# Patient Record
Sex: Male | Born: 1960
Health system: Southern US, Community
[De-identification: ages and names within clinical notes are randomized; demographics above are authoritative.]

## PROBLEM LIST (undated history)

## (undated) ENCOUNTER — Emergency Department (HOSPITAL_COMMUNITY): Discharge status: Absent without leave | Payer: Medicare HMO

## (undated) DIAGNOSIS — E785 Hyperlipidemia, unspecified: Secondary | ICD-10-CM

## (undated) DIAGNOSIS — E039 Hypothyroidism, unspecified: Secondary | ICD-10-CM

## (undated) DIAGNOSIS — F32A Depression, unspecified: Secondary | ICD-10-CM

## (undated) DIAGNOSIS — E119 Type 2 diabetes mellitus without complications: Secondary | ICD-10-CM

## (undated) DIAGNOSIS — Z1211 Encounter for screening for malignant neoplasm of colon: Secondary | ICD-10-CM

## (undated) DIAGNOSIS — G8929 Other chronic pain: Secondary | ICD-10-CM

## (undated) DIAGNOSIS — R7989 Other specified abnormal findings of blood chemistry: Secondary | ICD-10-CM

## (undated) DIAGNOSIS — F329 Major depressive disorder, single episode, unspecified: Secondary | ICD-10-CM

## (undated) DIAGNOSIS — F419 Anxiety disorder, unspecified: Secondary | ICD-10-CM

## (undated) DIAGNOSIS — I10 Essential (primary) hypertension: Secondary | ICD-10-CM

## (undated) DIAGNOSIS — G4730 Sleep apnea, unspecified: Secondary | ICD-10-CM

## (undated) HISTORY — DX: Anxiety disorder, unspecified: F41.9

## (undated) HISTORY — DX: Type 2 diabetes mellitus without complications: E11.9

## (undated) HISTORY — DX: Other chronic pain: G89.29

## (undated) HISTORY — PX: ARTHROSCOPIC REPAIR ACL: SUR80

## (undated) HISTORY — DX: Hyperlipidemia, unspecified: E78.5

## (undated) HISTORY — PX: OTHER SURGICAL HISTORY: SHX169

## (undated) HISTORY — PX: HERNIA REPAIR: SHX51

## (undated) HISTORY — DX: Encounter for screening for malignant neoplasm of colon: Z12.11

## (undated) HISTORY — DX: Other specified abnormal findings of blood chemistry: R79.89

## (undated) HISTORY — PX: CARPAL TUNNEL RELEASE: SHX101

## (undated) HISTORY — PX: SHOULDER ARTHROSCOPY: SHX128

## (undated) HISTORY — DX: Essential (primary) hypertension: I10

## (undated) HISTORY — DX: Major depressive disorder, single episode, unspecified: F32.9

## (undated) HISTORY — DX: Depression, unspecified: F32.A

---

## 2016-07-09 ENCOUNTER — Ambulatory Visit (INDEPENDENT_AMBULATORY_CARE_PROVIDER_SITE_OTHER): Payer: Medicare HMO | Admitting: Cardiovascular Disease

## 2016-07-09 ENCOUNTER — Encounter: Payer: Self-pay | Admitting: Cardiovascular Disease

## 2016-07-09 VITALS — BP 130/80 | HR 81 | Ht 66.0 in | Wt 265.2 lb

## 2016-07-09 DIAGNOSIS — Z9289 Personal history of other medical treatment: Secondary | ICD-10-CM | POA: Diagnosis not present

## 2016-07-09 DIAGNOSIS — I4510 Unspecified right bundle-branch block: Secondary | ICD-10-CM

## 2016-07-09 DIAGNOSIS — E118 Type 2 diabetes mellitus with unspecified complications: Secondary | ICD-10-CM | POA: Diagnosis not present

## 2016-07-09 DIAGNOSIS — I2510 Atherosclerotic heart disease of native coronary artery without angina pectoris: Secondary | ICD-10-CM | POA: Diagnosis not present

## 2016-07-09 DIAGNOSIS — E782 Mixed hyperlipidemia: Secondary | ICD-10-CM | POA: Diagnosis not present

## 2016-07-09 NOTE — Patient Instructions (Signed)
Medication Instructions:  Continue all current medications.  Labwork: none  Testing/Procedures: none  Follow-Up: As needed.    Any Other Special Instructions Will Be Listed Below (If Applicable).  If you need a refill on your cardiac medications before your next appointment, please call your pharmacy.  
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
## 2016-10-19 ENCOUNTER — Telehealth (HOSPITAL_COMMUNITY): Payer: Self-pay | Admitting: *Deleted

## 2016-10-19 NOTE — Telephone Encounter (Signed)
left voice message regarding an appointment. 

## 2017-02-05 ENCOUNTER — Other Ambulatory Visit: Payer: Self-pay

## 2017-02-05 ENCOUNTER — Ambulatory Visit (INDEPENDENT_AMBULATORY_CARE_PROVIDER_SITE_OTHER): Payer: Medicare HMO | Admitting: Family Medicine

## 2017-02-05 ENCOUNTER — Encounter: Payer: Self-pay | Admitting: Family Medicine

## 2017-02-05 VITALS — BP 120/70 | HR 64 | Temp 98.0°F | Resp 16 | Ht 66.0 in | Wt 264.2 lb

## 2017-02-05 DIAGNOSIS — I10 Essential (primary) hypertension: Secondary | ICD-10-CM | POA: Diagnosis not present

## 2017-02-05 DIAGNOSIS — Z794 Long term (current) use of insulin: Secondary | ICD-10-CM

## 2017-02-05 DIAGNOSIS — F419 Anxiety disorder, unspecified: Secondary | ICD-10-CM | POA: Diagnosis not present

## 2017-02-05 DIAGNOSIS — E1165 Type 2 diabetes mellitus with hyperglycemia: Secondary | ICD-10-CM | POA: Insufficient documentation

## 2017-02-05 DIAGNOSIS — E1169 Type 2 diabetes mellitus with other specified complication: Secondary | ICD-10-CM

## 2017-02-05 DIAGNOSIS — E039 Hypothyroidism, unspecified: Secondary | ICD-10-CM

## 2017-02-05 DIAGNOSIS — J40 Bronchitis, not specified as acute or chronic: Secondary | ICD-10-CM | POA: Diagnosis not present

## 2017-02-05 DIAGNOSIS — E118 Type 2 diabetes mellitus with unspecified complications: Secondary | ICD-10-CM | POA: Insufficient documentation

## 2017-02-05 DIAGNOSIS — F339 Major depressive disorder, recurrent, unspecified: Secondary | ICD-10-CM

## 2017-02-05 DIAGNOSIS — F332 Major depressive disorder, recurrent severe without psychotic features: Secondary | ICD-10-CM | POA: Insufficient documentation

## 2017-02-05 DIAGNOSIS — E119 Type 2 diabetes mellitus without complications: Secondary | ICD-10-CM | POA: Insufficient documentation

## 2017-02-05 DIAGNOSIS — Z79899 Other long term (current) drug therapy: Secondary | ICD-10-CM | POA: Diagnosis not present

## 2017-02-05 DIAGNOSIS — E785 Hyperlipidemia, unspecified: Secondary | ICD-10-CM | POA: Diagnosis not present

## 2017-02-05 DIAGNOSIS — F331 Major depressive disorder, recurrent, moderate: Secondary | ICD-10-CM | POA: Insufficient documentation

## 2017-02-05 MED ORDER — AZITHROMYCIN 250 MG PO TABS: ORAL_TABLET | ORAL | 0 refills | Status: DC

## 2017-02-05 MED ORDER — ALPRAZOLAM 0.5 MG PO TABS: 0.5000 mg | ORAL_TABLET | Freq: Two times a day (BID) | ORAL | 0 refills | Status: DC | PRN

## 2017-02-05 NOTE — Progress Notes (Signed)
Patient ID: Olyn Landstrom, male    DOB: [birth year], 58 y.o.   MRN: [id number]  Chief Complaint  Patient presents with  . Hyperlipidemia  . Hypothyroidism  . Diabetes    Allergies Codeine and Penicillins  Subjective:   Levaughn Puccinelli is a 56 y.o. male who presents to [facility] today.  HPI Here to establish care.  Patient reports that he has had a very stressful past couple months.  He reports that his daughter age 28 years old died a couple months ago from a very rare type of cancer.  In addition, he reports that his brother died April 23, 2016. Reports that dealing with the death of his daughter is the hardest thing he has ever dealt with.  He reports that he has dealt with depression in the past but it has been worse since his daughter passed away.  He reports that he he promised his daughter that he would not hurt himself. She was afraid that he would hurt himself if she died. Reports that he is divorced. Lives in [place], [place] for the past 14 months, moved from [place]. His daughter had lived here and so he had moved here.  Reports he has been treated for depression and anxiety.  Has been on the Cymbalta and Xanax.  Reports that it works well.  Denies any auditory or visual hallucinations.  Denies any suicidal ideations or homicidal ideations.  Reports that he does feel down and is dealing with his grief.  Reports that he is tearful at times when he thinks about his daughter.  Has been on Xanax for anxiety twice a day for greater than 5 years.  He reports that he has had chest congestion since 01/18/17. Was told to take mucinex and OTC cough syrup.  Reports that since that time he is continued to cough.  He reports he is coughing up green to yellow sputum.  Does not feel short of breath but reports he can hear wheezing in his chest.  Denies any fevers, nausea, vomiting.  Reports his appetite is good.  No history of tobacco use.  Has never had a history of asthma or been on any  inhalers in the past.   Reports that he has been on morphine time release, since 2006. Is on a total of morphine 180 mg a day from pain clinic in [place].  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  He reports that he already has an appointment scheduled with a pain clinic in [place].  He does not need any of this medication today.  Patient reports that he has been diabetic for over 4 years.  Denies any hypoglycemic episodes.  Does check his sugars and reports they usually run in the 100-200 range.  He did not bring any blood sugars for review today.  He reports he tries to eat a healthy diet.  Does not do any formal exercise.  Denies any lesions on his feet.  He reports he has never been told he has any diabetic retinopathy.  Has not had his eyes checked in several years.  Diabetes  He presents for his follow-up diabetic visit. He has type 2 diabetes mellitus. His disease course has been fluctuating. Pertinent negatives for hypoglycemia include no dizziness, headaches, seizures, sleepiness or tremors. There are no diabetic associated symptoms. Pertinent negatives for diabetes include no blurred vision, no  chest pain, no fatigue, no foot paresthesias, no foot ulcerations, no polydipsia, no polyphagia, no polyuria, no visual change, no weakness and no weight loss. There are no hypoglycemic complications. Symptoms are stable. There are no diabetic complications. Risk factors for coronary artery disease include diabetes mellitus, dyslipidemia, male sex, obesity and stress. Current diabetic treatment includes oral agent (triple therapy) and insulin injections. He is compliant with treatment all of the time. His weight is stable. He is following a diabetic and generally healthy diet. When asked about meal planning, he reported none. He has not had a previous visit with a dietitian. He participates in  exercise intermittently. His overall blood glucose range is 180-200 mg/dl. An ACE inhibitor/angiotensin II receptor blocker is being taken. He does not see a podiatrist.Eye exam is not current.  Cough  This is a new problem. The current episode started 1 to 4 weeks ago. The problem has been gradually worsening. The problem occurs every few minutes. The cough is productive of purulent sputum. Pertinent negatives include no chest pain, chills, ear congestion, ear pain, fever, headaches, heartburn, hemoptysis, myalgias, nasal congestion, postnasal drip, rhinorrhea, sore throat, shortness of breath or weight loss. Nothing aggravates the symptoms. He has tried OTC cough suppressant and rest for the symptoms. The treatment provided no relief. His past medical history is significant for bronchitis. There is no history of asthma, bronchiectasis, COPD, emphysema, environmental allergies or pneumonia.  Hypertension  This is a chronic problem. The current episode started more than 1 year ago. The problem has been gradually improving since onset. The problem is controlled. Pertinent negatives include no blurred vision, chest pain, headaches, malaise/fatigue, neck pain, orthopnea, palpitations or shortness of breath. Past treatments include lifestyle changes, diuretics and ACE inhibitors. The current treatment provides significant improvement. Compliance problems include exercise.     Past Medical History:  Diagnosis Date  . Chronic pain   . Depression   . Diabetes mellitus without complication (HCC)   . Hyperlipidemia   . Hypertension     Past Surgical History:  Procedure Laterality Date  . ankle sx    . ARTHROSCOPIC REPAIR ACL    . CARPAL TUNNEL RELEASE    . HERNIA REPAIR    . SHOULDER ARTHROSCOPY      Family History  Problem Relation Age of Onset  . Stomach cancer Mother   . Heart Problems Father   . Dementia Father   . Cancer Father   . Bone cancer Brother   . Lung cancer Brother   . Cancer  Daughter      Social History   Socioeconomic History  . Marital status: Unknown    Spouse name: None  . Number of children: None  . Years of education: None  . Highest education level: None  Social Needs  . Financial resource strain: None  . Food insecurity - worry: None  . Food insecurity - inability: None  . Transportation needs - medical: None  . Transportation needs - non-medical: None  Occupational History  . None  Tobacco Use  . Smoking status: Never Smoker  . Smokeless tobacco: Never Used  Substance and Sexual Activity  . Alcohol use: Yes  . Drug use: No  . Sexual activity: No  Other Topics Concern  . None  Social History Narrative   Retired from 2004 in [employer].   Takes time release morphine b/c fell 20 feet from a building while working.    Exercises/walks.   Eats all food groups.   Has  pets at home.   Does not smoke.   No tobacco products or drugs.    Current Outpatient Medications on File Prior to Visit  Medication Sig Dispense Refill  . DULoxetine (CYMBALTA) 60 MG capsule Take 60 mg by mouth daily.  1  . fluticasone (FLONASE) 50 MCG/ACT nasal spray USE ONE SPRAY in each nostril ONCE EVERY DAY  0  . glipiZIDE (GLUCOTROL XL) 10 MG 24 hr tablet Take 10 mg by mouth daily with breakfast.    . levothyroxine (SYNTHROID, LEVOTHROID) 150 MCG tablet Take 150 mcg by mouth daily before breakfast.    . metFORMIN (GLUCOPHAGE) 1000 MG tablet Take 1,000 mg by mouth 2 (two) times daily.  3  . morphine (MS CONTIN) 30 MG 12 hr tablet Take 1 tablet by mouth 2 (two) times daily. Take with the 60mg      . morphine (MS CONTIN) 60 MG 12 hr tablet Take 1 tablet by mouth 2 (two) times daily. Takes with the 30mg      . simvastatin (ZOCOR) 40 MG tablet TAKE 1 TABLET BY MOUTH IN THE EVENING ONCE A DAILY EMERGENCY REFILL FAXED DR  3  . tamsulosin (FLOMAX) 0.4 MG CAPS capsule AS DIRECTED 30 MINUTES AFTER THE SAME MEAL EACH DAY.  3   No current facility-administered medications on  file prior to visit.      Review of Systems  Constitutional: Negative for chills, fatigue, fever, malaise/fatigue and weight loss.  HENT: Negative for ear pain, postnasal drip, rhinorrhea and sore throat.   Eyes: Negative for blurred vision.  Respiratory: Positive for cough. Negative for hemoptysis and shortness of breath.   Cardiovascular: Negative for chest pain, palpitations and orthopnea.  Gastrointestinal: Negative for heartburn.  Endocrine: Negative for polydipsia, polyphagia and polyuria.  Musculoskeletal: Negative for myalgias and neck pain.  Allergic/Immunologic: Negative for environmental allergies.  Neurological: Negative for dizziness, tremors, seizures, weakness and headaches.     Objective:   BP 120/70 (BP Location: Left Arm, Patient Position: Sitting, Cuff Size: Normal)   Pulse 64   Temp 98 F (36.7 C) (Temporal)   Resp 16   Ht 5\' 6"  (1.676 m)   Wt 264 lb 4 oz (119.9 kg)   SpO2 98%   BMI 42.65 kg/m   Physical Exam  Constitutional: He is oriented to person, place, and time. He appears well-developed and well-nourished.  HENT:  Head: Normocephalic and atraumatic.  Mouth/Throat: Oropharynx is clear and moist. No oropharyngeal exudate.  Eyes: EOM are normal. Pupils are equal, round, and reactive to light. Right eye exhibits no discharge.  Neck: Normal range of motion. Neck supple. No JVD present. No tracheal deviation present.  Cardiovascular: Normal rate, regular rhythm and normal heart sounds.  Pulmonary/Chest: Effort normal. No stridor. No respiratory distress. He has no wheezes. He has no rales. He exhibits no tenderness.  Coarse upper airway noise. No wheezes.   Abdominal: Soft. Bowel sounds are normal.  obese  Lymphadenopathy:    He has no cervical adenopathy.  Neurological: He is alert and oriented to person, place, and time. No cranial nerve deficit.  Skin: Skin is warm and dry. No rash noted.  Psychiatric: His behavior is normal. Judgment and thought  content normal. His mood appears anxious. His affect is labile. He is not agitated, not aggressive, not slowed and not withdrawn. Cognition and memory are normal. He expresses no homicidal and no suicidal ideation. He expresses no suicidal plans and no homicidal plans.     Assessment and Plan  1. Depression, recurrent (HCC) Continue the cymbalta and xanax as directed. Xanax refilled b/c I do not want him to run out of medication due to the chronicity of the medication. - Ambulatory referral to Psychiatry He does understand that he will not receive refills of this medication from our office subsequently. Suicide risks evaluated and documented in note if present or in the area below.  Patient does not have/denies the following risks: previous suicide attempts, family history of suicide, access to lethal means, prior history of psychiatric disorder, history of alcohol or substance abuse disorder, recent loss of a loved one, or severe hopelessness. Patient denies access to firearms or if present will have removed from home/access.   Patient has protective factors of family and community support.  Patient displays problem solving skills.   Patient specifically denies suicide ideation. Patient has access/information to healthcare contacts if situation or mood changes where patient is a risk to self or others or mood becomes unstable.   During the encounter, the patient had good eye contact and firm handshake regarding safety contract and agreement to seek help if mood worsens and not to harm self.   Patient understands the treatment plan and is in agreement. Agrees to keep follow up and call prior or return to clinic if needed.   2. Diabetes mellitus type 2, insulin dependent (HCC) Uncertain control on multiple medications.  Check labs today.  Plan to place referral for ophthalmology and to perform foot exam at next visit.  Immunizations up-to-date. - Hemoglobin A1c - Microalbumin / creatinine  urine ratio  3. HTN, goal below 130/80 Controlled.  Continue current medications.  Check labs today.  4. Hyperlipidemia associated with type 2 diabetes mellitus (HCC) Hyperlipidemia and the associated risk of ASCVD were discussed today. Primary vs. Secondary prevention of ASCVD were discussed and how it relates to patient morbidity, mortality, and quality of life. Shared decision making with patient including the risks of statins vs.benefits of ASCVD risk reduction discussed.  Risks of stains discussed including myopathy, rhabdomyoloysis, liver problems, increased risk of diabetes discussed. We discussed heart healthy diet, lifestyle modifications, risk factor modifications, and adherence to the recommended treatment plan. We discussed the need to periodically monitor lipid panel and liver function tests while on statin therapy.  He will continue his statin therapy that he has been on. - Lipid panel  5. High risk medication use  - Basic metabolic panel - Hepatic function panel - CBC with Differential/Platelet  6. Hypothyroidism, unspecified type Continue current Synthroid dosing.  Check labs. - TSH  7. Anxiety Stable.Patient counseled in detail regarding the risks of medication. Told to call or return to clinic if develop any worrisome signs or symptoms. Patient voiced understanding.   - ALPRAZolam (XANAX) 0.5 MG tablet; Take 1 tablet (0.5 mg total) by mouth 2 (two) times daily as needed for anxiety.  Dispense: 60 tablet; Refill: 0  8. Bronchitis Patient was counseled concerning worrisome signs and symptoms.  If those develop he needs to go to the emergency department.  He was instructed to start the antibiotics today. - azithromycin (ZITHROMAX) 250 MG tablet; Take 2 pills a day for the first day and then one pill a day for the next four days.  Dispense: 6 tablet; Refill: 0 He was told that if his symptoms do not resolve that he needs to follow-up or call our office next week. Return in  about 3 weeks (around 02/26/2017) for follow up. Aliene Beams, MD 02/05/2017

## 2017-02-06 LAB — HEPATIC FUNCTION PANEL
AG Ratio: 1.7 (calc) (ref 1.0–2.5)
ALT: 75 U/L — ABNORMAL HIGH (ref 9–46)
AST: 57 U/L — ABNORMAL HIGH (ref 10–35)
Albumin: 4.4 g/dL (ref 3.6–5.1)
Alkaline phosphatase (APISO): 65 U/L (ref 40–115)
Bilirubin, Direct: 0.1 mg/dL (ref 0.0–0.2)
Globulin: 2.6 g/dL (calc) (ref 1.9–3.7)
Indirect Bilirubin: 0.5 mg/dL (calc) (ref 0.2–1.2)
Total Bilirubin: 0.6 mg/dL (ref 0.2–1.2)
Total Protein: 7 g/dL (ref 6.1–8.1)

## 2017-02-06 LAB — CBC WITH DIFFERENTIAL/PLATELET
Basophils Absolute: 77 cells/uL (ref 0–200)
Basophils Relative: 1 %
Eosinophils Absolute: 701 cells/uL — ABNORMAL HIGH (ref 15–500)
Eosinophils Relative: 9.1 %
HCT: 45.9 % (ref 38.5–50.0)
Hemoglobin: 15.5 g/dL (ref 13.2–17.1)
Lymphs Abs: 1863 cells/uL (ref 850–3900)
MCH: 28.8 pg (ref 27.0–33.0)
MCHC: 33.8 g/dL (ref 32.0–36.0)
MCV: 85.3 fL (ref 80.0–100.0)
MPV: 10.7 fL (ref 7.5–12.5)
Monocytes Relative: 9.6 %
Neutro Abs: 4320 cells/uL (ref 1500–7800)
Neutrophils Relative %: 56.1 %
Platelets: 169 10*3/uL (ref 140–400)
RBC: 5.38 10*6/uL (ref 4.20–5.80)
RDW: 13.5 % (ref 11.0–15.0)
Total Lymphocyte: 24.2 %
WBC mixed population: 739 cells/uL (ref 200–950)
WBC: 7.7 10*3/uL (ref 3.8–10.8)

## 2017-02-06 LAB — BASIC METABOLIC PANEL
BUN: 16 mg/dL (ref 7–25)
CO2: 28 mmol/L (ref 20–32)
Calcium: 9.5 mg/dL (ref 8.6–10.3)
Chloride: 97 mmol/L — ABNORMAL LOW (ref 98–110)
Creat: 0.72 mg/dL (ref 0.70–1.33)
Glucose, Bld: 292 mg/dL — ABNORMAL HIGH (ref 65–139)
Potassium: 4.6 mmol/L (ref 3.5–5.3)
Sodium: 135 mmol/L (ref 135–146)

## 2017-02-06 LAB — HEMOGLOBIN A1C
Hgb A1c MFr Bld: 9.6 % of total Hgb — ABNORMAL HIGH (ref <5.7)
Mean Plasma Glucose: 229 (calc)
eAG (mmol/L): 12.7 (calc)

## 2017-02-06 LAB — LIPID PANEL
Cholesterol: 157 mg/dL (ref <200)
HDL: 42 mg/dL (ref >40)
LDL Cholesterol (Calc): 87 mg/dL (calc)
Non-HDL Cholesterol (Calc): 115 mg/dL (calc) (ref <130)
Total CHOL/HDL Ratio: 3.7 (calc) (ref <5.0)
Triglycerides: 185 mg/dL — ABNORMAL HIGH (ref <150)

## 2017-02-06 LAB — MICROALBUMIN / CREATININE URINE RATIO
Creatinine, Urine: 162 mg/dL (ref 20–320)
Microalb Creat Ratio: 10 mcg/mg creat (ref <30)
Microalb, Ur: 1.7 mg/dL

## 2017-02-06 LAB — TSH: TSH: 1.98 mIU/L (ref 0.40–4.50)

## 2017-02-08 ENCOUNTER — Encounter: Payer: Self-pay | Admitting: Family Medicine

## 2017-02-08 ENCOUNTER — Telehealth: Payer: Self-pay | Admitting: Family Medicine

## 2017-02-08 NOTE — Telephone Encounter (Signed)
Pt is calling in, to advise he is no better. All the same symptoms, not feeling well, Dr Tracie HarrierHagler advised him to call back if he was not better..[phone]

## 2017-02-09 NOTE — Telephone Encounter (Signed)
Called patient regarding message below. No answer, left generic message for patient to return call.   

## 2017-02-09 NOTE — Progress Notes (Signed)
Called patient regarding message below. No answer, left generic message for patient to return call.   

## 2017-02-11 ENCOUNTER — Telehealth: Payer: Self-pay | Admitting: Family Medicine

## 2017-02-11 NOTE — Telephone Encounter (Signed)
Called patient regarding message below. No answer, left generic message for patient to return call. - to discuss lab results.

## 2017-02-11 NOTE — Telephone Encounter (Signed)
Pt called said he left a msg for you to call, and he was following back up with you

## 2017-02-12 ENCOUNTER — Other Ambulatory Visit: Payer: Self-pay | Admitting: Family Medicine

## 2017-02-12 MED ORDER — BENZONATATE 100 MG PO CAPS: 100.0000 mg | ORAL_CAPSULE | Freq: Two times a day (BID) | ORAL | 0 refills | Status: DC | PRN

## 2017-02-12 NOTE — Telephone Encounter (Signed)
Patient informed of message below, verbalized understanding.  

## 2017-02-12 NOTE — Telephone Encounter (Signed)
If fevr , chills , weakness and fatigue needs clinical eval, if just cough, and congestion , trioal of tessalon perles to help decongest, I have entered 14 please send if he agrees

## 2017-02-12 NOTE — Telephone Encounter (Signed)
Pt calling back in -Left Msg, stating he had not heard from anyone, and still feels bad--please call

## 2017-02-12 NOTE — Progress Notes (Signed)
Tessalon perle 

## 2017-02-12 NOTE — Telephone Encounter (Signed)
Patient was seen in the office last week, was diagnosed with bronchitis. Has finish abx and is still feeling bad- congested cough. I informed that Dr Tracie HarrierHagler is out of office and will not return until 12/27. He does not want to go to UC or ED because he does not have a lot of money to pay copay. Please advise if there is something we can do for him.

## 2017-02-18 ENCOUNTER — Other Ambulatory Visit: Payer: Self-pay

## 2017-02-18 MED ORDER — INSULIN GLARGINE 100 UNIT/ML ~~LOC~~ SOLN: 50.0000 [IU] | Freq: Every day | SUBCUTANEOUS | 0 refills | Status: DC

## 2017-02-23 DIAGNOSIS — G459 Transient cerebral ischemic attack, unspecified: Secondary | ICD-10-CM

## 2017-02-23 HISTORY — DX: Transient cerebral ischemic attack, unspecified: G45.9

## 2017-02-26 ENCOUNTER — Ambulatory Visit: Payer: Medicare HMO | Admitting: Family Medicine

## 2017-09-05 ENCOUNTER — Emergency Department (HOSPITAL_COMMUNITY): Payer: Medicare HMO

## 2017-09-05 ENCOUNTER — Encounter (HOSPITAL_COMMUNITY): Payer: Self-pay | Admitting: *Deleted

## 2017-09-05 ENCOUNTER — Other Ambulatory Visit: Payer: Self-pay

## 2017-09-05 ENCOUNTER — Emergency Department (HOSPITAL_COMMUNITY)
Admission: EM | Admit: 2017-09-05 | Discharge: 2017-09-06 | Disposition: A | Discharge status: Home / Self Care | Payer: Medicare HMO | Attending: Emergency Medicine | Admitting: Emergency Medicine

## 2017-09-05 DIAGNOSIS — Y929 Unspecified place or not applicable: Secondary | ICD-10-CM | POA: Insufficient documentation

## 2017-09-05 DIAGNOSIS — S93401A Sprain of unspecified ligament of right ankle, initial encounter: Secondary | ICD-10-CM | POA: Insufficient documentation

## 2017-09-05 DIAGNOSIS — Y999 Unspecified external cause status: Secondary | ICD-10-CM | POA: Diagnosis not present

## 2017-09-05 DIAGNOSIS — E119 Type 2 diabetes mellitus without complications: Secondary | ICD-10-CM | POA: Insufficient documentation

## 2017-09-05 DIAGNOSIS — Z79899 Other long term (current) drug therapy: Secondary | ICD-10-CM | POA: Insufficient documentation

## 2017-09-05 DIAGNOSIS — Z7984 Long term (current) use of oral hypoglycemic drugs: Secondary | ICD-10-CM | POA: Insufficient documentation

## 2017-09-05 DIAGNOSIS — I10 Essential (primary) hypertension: Secondary | ICD-10-CM | POA: Diagnosis not present

## 2017-09-05 DIAGNOSIS — W208XXA Other cause of strike by thrown, projected or falling object, initial encounter: Secondary | ICD-10-CM | POA: Diagnosis not present

## 2017-09-05 DIAGNOSIS — S99911A Unspecified injury of right ankle, initial encounter: Secondary | ICD-10-CM | POA: Diagnosis present

## 2017-09-05 DIAGNOSIS — Y9389 Activity, other specified: Secondary | ICD-10-CM | POA: Diagnosis not present

## 2017-09-05 MED ORDER — BACITRACIN-NEOMYCIN-POLYMYXIN 400-5-5000 EX OINT
TOPICAL_OINTMENT | Freq: Once | CUTANEOUS | Status: AC
  Administered 2017-09-05: 1 via TOPICAL
  Filled 2017-09-05: qty 1

## 2017-09-05 MED ORDER — KETOROLAC TROMETHAMINE 60 MG/2ML IM SOLN
30.0000 mg | Freq: Once | INTRAMUSCULAR | Status: AC
  Administered 2017-09-05: 30 mg via INTRAMUSCULAR
  Filled 2017-09-05: qty 2

## 2017-09-05 NOTE — ED Notes (Signed)
Pt working on motorcycle when it fell against his leg  Burn to leg and pain to ankle

## 2017-09-05 NOTE — Discharge Instructions (Addendum)
Home to rest. Apply ice for 20 minutes at a time, elevate ankle. Take Motrin and Tylenol as directed as needed for pain. Follow up with orthopedics for recheck in 1 week.

## 2017-09-05 NOTE — ED Triage Notes (Signed)
Pt states that a motrocycle fell on right ankle tonight, c/o pain and swelling to right ankle, burn to inner thigh on left leg,
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
## 2017-09-05 NOTE — ED Notes (Signed)
In to assess

## 2017-09-06 NOTE — ED Notes (Signed)
Cam walker applied by Casimiro NeedleMichael NT, pt tolerated well,

## 2017-09-30 ENCOUNTER — Emergency Department (HOSPITAL_COMMUNITY)
Admission: EM | Admit: 2017-09-30 | Discharge: 2017-09-30 | Disposition: A | Discharge status: Home / Self Care | Payer: Medicare HMO | Attending: Emergency Medicine | Admitting: Emergency Medicine

## 2017-09-30 ENCOUNTER — Other Ambulatory Visit: Payer: Self-pay

## 2017-09-30 ENCOUNTER — Encounter (HOSPITAL_COMMUNITY): Payer: Self-pay

## 2017-09-30 ENCOUNTER — Emergency Department (HOSPITAL_COMMUNITY): Payer: Medicare HMO

## 2017-09-30 DIAGNOSIS — Z794 Long term (current) use of insulin: Secondary | ICD-10-CM | POA: Diagnosis not present

## 2017-09-30 DIAGNOSIS — F329 Major depressive disorder, single episode, unspecified: Secondary | ICD-10-CM | POA: Diagnosis not present

## 2017-09-30 DIAGNOSIS — W19XXXA Unspecified fall, initial encounter: Secondary | ICD-10-CM

## 2017-09-30 DIAGNOSIS — W11XXXA Fall on and from ladder, initial encounter: Secondary | ICD-10-CM | POA: Insufficient documentation

## 2017-09-30 DIAGNOSIS — M25562 Pain in left knee: Secondary | ICD-10-CM | POA: Diagnosis not present

## 2017-09-30 DIAGNOSIS — I10 Essential (primary) hypertension: Secondary | ICD-10-CM | POA: Insufficient documentation

## 2017-09-30 DIAGNOSIS — E119 Type 2 diabetes mellitus without complications: Secondary | ICD-10-CM | POA: Insufficient documentation

## 2017-09-30 DIAGNOSIS — M25571 Pain in right ankle and joints of right foot: Secondary | ICD-10-CM | POA: Diagnosis not present

## 2017-09-30 DIAGNOSIS — Z79899 Other long term (current) drug therapy: Secondary | ICD-10-CM | POA: Diagnosis not present

## 2017-09-30 MED ORDER — MELOXICAM 7.5 MG PO TABS
7.5000 mg | ORAL_TABLET | Freq: Once | ORAL | Status: AC
  Administered 2017-09-30: 7.5 mg via ORAL
  Filled 2017-09-30: qty 1

## 2017-09-30 MED ORDER — MELOXICAM 7.5 MG PO TABS: 7.5000 mg | ORAL_TABLET | Freq: Every day | ORAL | 0 refills | Status: DC

## 2017-09-30 NOTE — ED Provider Notes (Signed)
[facility] EMERGENCY DEPARTMENT Provider Note   CSN: [id number] Arrival date & time: 09/30/17  0830     History   Chief Complaint Chief Complaint  Patient presents with  . Fall    HPI Thomas Rollins is a 57 y.o. male with history of chronic pain, depression, anxiety, diabetes mellitus, HLD, HTN presents for evaluation of acute onset, constant left knee pain for 1 week.  He states that one week ago at 1 AM he was on a 16 foot ladder working on his roof where he has a sump pump.  He states on his way down, he missed a rung and landed on his left knee and shoulder.  He denies head injury or loss of consciousness.  He notes constant pain to the left knee which occasionally is sharp and stabbing, primarily localized to the lateral aspects of the knee.  Pain worsens with ambulation.  He was also seen and evaluated on 09/05/2017 for right ankle pain secondary to fall.  He was put in a cam walker which he states was too large for him and has resulted in multiple falls since then as he will trip on the lip of the boot.  He denies shoulder pain, numbness, weakness, or any worsening of his chronic low back pain.  He denies bowel or bladder incontinence, saddle anesthesia, fevers, or history of IV drug use.  He typically ambulates with a cane.  He currently sees a pain management specialist in [place] on a monthly basis.  [place] controlled substance registry showed he last filled a month supply of morphine sulfate ER 30 mg and 60 mg tablets which he takes twice daily.  This prescription was filled on 09/28/2017.  He also receives Ambien and Xanax prescriptions from his primary care physician here on a monthly basis.  He states that his pain management physicians in [place] are requesting x-rays to be sent to their office. The history is provided by the patient.    Past Medical History:  Diagnosis Date  . Chronic pain   . Depression   . Diabetes mellitus without complication (HCC)   . Hyperlipidemia    . Hypertension     Patient Active Problem List   Diagnosis Date Noted  . Diabetes mellitus type 2, insulin dependent (HCC) 02/05/2017  . Depression, recurrent (HCC) 02/05/2017  . Hyperlipidemia associated with type 2 diabetes mellitus (HCC) 02/05/2017  . HTN, goal below 130/80 02/05/2017    Past Surgical History:  Procedure Laterality Date  . ankle sx    . ARTHROSCOPIC REPAIR ACL    . CARPAL TUNNEL RELEASE    . HERNIA REPAIR    . SHOULDER ARTHROSCOPY          Home Medications    Prior to Admission medications   Medication Sig Start Date End Date Taking? Authorizing Provider  ALPRAZolam Prudy Feeler) 0.5 MG tablet Take 1 tablet (0.5 mg total) by mouth 2 (two) times daily as needed for anxiety. 02/05/17   Aliene Beams, MD  dapagliflozin propanediol (FARXIGA) 10 MG TABS tablet Take 10 mg by mouth daily.    [provider]  DULoxetine (CYMBALTA) 60 MG capsule Take 60 mg by mouth daily. 06/21/16   [provider]  fluticasone (FLONASE) 50 MCG/ACT nasal spray USE ONE SPRAY in each nostril ONCE EVERY DAY 01/18/17   [provider]  glipiZIDE (GLUCOTROL XL) 10 MG 24 hr tablet Take 10 mg by mouth daily with breakfast.    [provider]  insulin glargine (  LANTUS) 100 UNIT/ML injection Inject 0.5 mLs (50 Units total) into the skin at bedtime. 02/18/17   Aliene Beams, MD  levothyroxine (SYNTHROID, LEVOTHROID) 150 MCG tablet Take 150 mcg by mouth daily before breakfast.    [provider]  meloxicam (MOBIC) 7.5 MG tablet Take 1 tablet (7.5 mg total) by mouth daily. 09/30/17   Henslee Lottman A, PA-C  metFORMIN (GLUCOPHAGE) 1000 MG tablet Take 1,000 mg by mouth 2 (two) times daily. 06/21/16   [provider]  morphine (MS CONTIN) 30 MG 12 hr tablet Take 1 tablet by mouth 2 (two) times daily. Take with the 60mg   07/01/16   [provider]  morphine (MS CONTIN) 60 MG 12 hr tablet Take 1 tablet by mouth 2 (two) times daily. Takes with the 30mg    07/01/16   [provider]  simvastatin (ZOCOR) 40 MG tablet TAKE 1 TABLET BY MOUTH IN THE EVENING ONCE A DAILY EMERGENCY REFILL FAXED DR 06/21/16   [provider]  tamsulosin (FLOMAX) 0.4 MG CAPS capsule AS DIRECTED 30 MINUTES AFTER THE SAME MEAL EACH DAY. 04/17/16   [provider]    Family History Family History  Problem Relation Age of Onset  . Stomach cancer Mother   . Heart Problems Father   . Dementia Father   . Cancer Father   . Bone cancer Brother   . Lung cancer Brother   . Cancer Daughter     Social History Social History   Tobacco Use  . Smoking status: Never Smoker  . Smokeless tobacco: Never Used  Substance Use Topics  . Alcohol use: Yes    Comment: occ  . Drug use: No     Allergies   Codeine; Other; and Penicillins   Review of Systems Review of Systems  Constitutional: Negative for fever.  Musculoskeletal: Positive for arthralgias.  Neurological: Negative for syncope, weakness, numbness and headaches.  All other systems reviewed and are negative.    Physical Exam Updated Vital Signs BP (!) 156/87   Pulse 83   Resp 18   Ht 5\' 7"  (1.702 m)   Wt 117.9 kg   SpO2 99%   BMI 40.72 kg/m   Physical Exam  Constitutional: He appears well-developed and well-nourished. No distress.  HENT:  Head: Normocephalic and atraumatic.  Eyes: Conjunctivae are normal. Right eye exhibits no discharge. Left eye exhibits no discharge.  Neck: No JVD present. No tracheal deviation present.  Cardiovascular: Normal rate and intact distal pulses.  2+ radial and DP/PT pulses bilaterally, Homans sign absent bilaterally, no lower extremity edema, no palpable cords, compartments are soft   Pulmonary/Chest: Effort normal.  Abdominal: He exhibits no distension.  Musculoskeletal: He exhibits tenderness. He exhibits no edema.  Diffuse midline lumbar spine tenderness and paralumbar muscle tenderness.  Patient states this is chronic and unchanged.  No  deformity, crepitus, or step-off noted.  Normal active range of motion of the bilateral upper extremities.  5/5 strength of BUE and BLE major muscle groups.  There is mild tenderness to palpation of the medial and lateral joint lines of the left knee.  No quad tendon deformity.  Able to extend the knee against gravity.  No varus valgus instability, negative anterior/posterior drawer test.  No significant effusion or ecchymosis.  There is mild tenderness to palpation of the lateral aspect of the right ankle.  No limitus laxity or varus valgus instability noted.  Examination of the Achilles tendon is within normal limits.  Neurological: He is alert. No  sensory deficit. He exhibits normal muscle tone.  Fluent speech no evidence of dysarthria or aphasia, no facial droop, sensation intact to soft touch of extremities.  Ambulates with an antalgic gait but exhibits good balance.  Skin: Skin is warm and dry. No erythema.  Psychiatric: He has a normal mood and affect. His behavior is normal.  Nursing note and vitals reviewed.    ED Treatments / Results  Labs (all labs ordered are listed, but only abnormal results are displayed) Labs Reviewed - No data to display  EKG None  Radiology Dg Ankle Complete Right  Result Date: 09/30/2017 CLINICAL DATA:  RIGHT ankle pain after falling several times wearing a Cam boot EXAM: RIGHT ANKLE - COMPLETE 3+ VIEW COMPARISON:  09/05/2017 FINDINGS: Mild soft tissue swelling. Osseous mineralization normal. Joint spaces preserved. Non fused ossicle adjacent to tip of medial malleolus. Medial malleolar and medial joint line spurring. Plantar and Achilles insertion calcaneal spurring. No acute fracture, dislocation, or bone destruction. IMPRESSION: Mild degenerative changes without acute osseous abnormalities. Electronically Signed   By: Ulyses SouthwardMark  Boles M.D.   On: 09/30/2017 10:01   Dg Knee Complete 4 Views Left  Result Date: 09/30/2017 CLINICAL DATA:  Fall from ladder 1  week ago with persistent left knee pain, initial encounter EXAM: LEFT KNEE - COMPLETE 4+ VIEW COMPARISON:  None. FINDINGS: Small joint effusion is noted. No acute fracture or dislocation is seen. No other focal abnormality is noted. IMPRESSION: Small joint effusion without acute bony abnormality. Electronically Signed   By: Alcide CleverMark  Lukens M.D.   On: 09/30/2017 10:02    Procedures Procedures (including critical care time)  Medications Ordered in ED Medications  meloxicam (MOBIC) tablet 7.5 mg (has no administration in time range)     Initial Impression / Assessment and Plan / ED Course  I have reviewed the triage vital signs and the nursing notes.  Pertinent labs & imaging results that were available during my care of the patient were reviewed by me and considered in my medical decision making (see chart for details).     Patient presents for evaluation of ongoing right ankle pain and new onset left knee pain status post mechanical fall.  No head injury or loss of consciousness.  No worsening low back pain.  No red signs concerning for cauda equina or spinal abscess.  He is ambulatory despite pain.  He is neurovascularly intact.  Radiographs show no acute osseous abnormality but do show degenerative changes of the ankle and small joint effusion of the knee.  No concern for DVT, secondary skin infection, osteomyelitis.  He has pain management in [place] and also receives Xanax and Ambien through his PCP.  He was refitted for a new cam walker but also offered ASO ankle brace.  Knee sleeve applied.  Will discharge with Mobic.  Inserted patient to follow-up with his PCP and orthopedics for reevaluation.  He will also follow-up with his pain management physician.  Discussed strict ED return precautions. Pt verbalized understanding of and agreement with plan and is safe for discharge home at this time.   Final Clinical Impressions(s) / ED Diagnoses   Final diagnoses:  Acute pain of left knee  Acute  right ankle pain  Fall, initial encounter    ED Discharge Orders         Ordered    meloxicam (MOBIC) 7.5 MG tablet  Daily     09/30/17 1035           Calen Posch, RooseveltMina A, PA-C  09/30/17 1038    Donnetta Hutching, MD 10/01/17 [phone]

## 2017-09-30 NOTE — Discharge Instructions (Addendum)
1. Medications: Take Mobic with food as prescribed for pain.  Stop taking this medicine if it causes any upset stomach issues.  You can also take Tylenol every 6 hours in addition to your home medicines.  Do not exceed 4000 mg of Tylenol daily.  2. Treatment: rest, ice, elevate and use brace, drink plenty of fluids, gentle stretching.  Home health will contact you to set up delivery of the walker. 3. Follow Up: Please followup with orthopedics as directed or your PCP in 1 week if no improvement for discussion of your diagnoses and further evaluation after today's visit; if you do not have a primary care doctor use the resource guide provided to find one; Please return to the ER for worsening symptoms or other concerns such as worsening swelling, redness of the skin, fevers, loss of pulses, or loss of feeling

## 2017-09-30 NOTE — ED Notes (Signed)
ED Provider at bedside. 

## 2017-09-30 NOTE — ED Triage Notes (Signed)
Pt reports he fell from a ladder a week ago and landed on left knee.  Reports pain and swelling.  Pt also hurt r ankle approx a month ago and is wearing a cam walker.  PT says has fallen several times since wearing the boot.  Pt says the boot is too big.  Pt is currently on pain management and sees a pain management doctor in [place].  Pt says he goes to [place] once a month to see his pain management doctor.

## 2017-10-01 NOTE — ED Notes (Signed)
10-01-2017 1230  Received call from pt stating that he never heard back from EDP for walker. States he has difficulty getting around. Dr. Adriana Simasook notified. Paper prescription for walker given to this RN. Informed pt of prescription and will have someone come pick it up shortly.

## 2017-10-20 NOTE — Congregational Nurse Program (Signed)
Congregational Nurse Program Note  Date of Encounter: 10/20/2017  Past Medical History: Past Medical History:  Diagnosis Date  . Chronic pain   . Depression   . Diabetes mellitus without complication (HCC)   . Hyperlipidemia   . Hypertension     Encounter Details: CNP Questionnaire - 10/20/17 1043      Questionnaire   Patient Status  Not Applicable    Race  White or Caucasian    [facility] Patient Served At  [facility] [place]    Insurance  Medicaid;Medicare;Private Insurance    Uninsured  Not Applicable    Food  No food insecurities    Housing/Utilities  Yes, have permanent housing    Transportation  No transportation needs    Interpersonal Safety  Yes, feel physically and emotionally safe where you currently live    Medication  No medication insecurities    Medical Provider  Yes    Referrals  Not Applicable    ED Visit Averted  Not Applicable    Life-Saving Intervention Made  Not Applicable       Congregational Nurse Program Note  Date of Encounter: 10/20/2017  Past Medical History: Past Medical History:  Diagnosis Date  . Chronic pain   . Depression   . Diabetes mellitus without complication (HCC)   . Hyperlipidemia   . Hypertension     Encounter Details: CNP Questionnaire - 10/20/17 1043      Questionnaire   Patient Status  Not Applicable    Race  White or Caucasian    Location Patient Served At  [facility] [place]    Insurance  Medicaid;Medicare;Private Insurance    Uninsured  Not Applicable    Food  No food insecurities    Housing/Utilities  Yes, have permanent housing    Transportation  No transportation needs    Interpersonal Safety  Yes, feel physically and emotionally safe where you currently live    Medication  No medication insecurities    Medical Provider  Yes    Referrals  Not Applicable    ED Visit Averted  Not Applicable    Life-Saving Intervention Made  Not Applicable       Congregational Nurse Program Note  Date  of Encounter: 10/20/2017  Past Medical History: Past Medical History:  Diagnosis Date  . Chronic pain   . Depression   . Diabetes mellitus without complication (HCC)   . Hyperlipidemia   . Hypertension     Encounter Details: CNP Questionnaire - 10/20/17 1043      Questionnaire   Patient Status  Not Applicable    Race  White or Caucasian    [facility] Patient Served At  [facility] [place]    Insurance  Medicaid;Medicare;Private Insurance    Uninsured  Not Applicable    Food  No food insecurities    Housing/Utilities  Yes, have permanent housing    Transportation  No transportation needs    Interpersonal Safety  Yes, feel physically and emotionally safe where you currently live    Medication  No medication insecurities    Medical Provider  Yes    Referrals  Not Applicable    ED Visit Averted  Not Applicable    Life-Saving Intervention Made  Not Applicable      Seen at th e food pantry.No complaints. [address] Kjos RN, [address] [place] [facility], [phone]

## 2018-05-24 NOTE — Progress Notes (Deleted)
Psychiatric Initial Adult Assessment   Patient Identification: Thomas Rollins MRN:  [id number] Date of Evaluation:  05/24/2018 Referral Source: *** Chief Complaint:   Visit Diagnosis: No diagnosis found.  History of Present Illness:   Thomas Rollins is a 58 y.o. year old male with a history of depression, diabetes, hypertension, hyperlipidemia, who is referred for depression.    On xanax  Associated Signs/Symptoms: Depression Symptoms:  {DEPRESSION SYMPTOMS:20000} (Hypo) Manic Symptoms:  {BHH MANIC SYMPTOMS:22872} Anxiety Symptoms:  {BHH ANXIETY SYMPTOMS:22873} Psychotic Symptoms:  {BHH PSYCHOTIC SYMPTOMS:22874} PTSD Symptoms: {BHH PTSD SYMPTOMS:22875}  Past Psychiatric History:  Outpatient:  Psychiatry admission:  Previous suicide attempt:  Past trials of medication:  History of violence:   Previous Psychotropic Medications: {YES/NO:21197}  Substance Abuse History in the last 12 months:  {yes no:314532}  Consequences of Substance Abuse: {BHH CONSEQUENCES OF SUBSTANCE ABUSE:22880}  Past Medical History:  Past Medical History:  Diagnosis Date  . Chronic pain   . Depression   . Diabetes mellitus without complication (HCC)   . Hyperlipidemia   . Hypertension     Past Surgical History:  Procedure Laterality Date  . ankle sx    . ARTHROSCOPIC REPAIR ACL    . CARPAL TUNNEL RELEASE    . HERNIA REPAIR    . SHOULDER ARTHROSCOPY      Family Psychiatric History: ***  Family History:  Family History  Problem Relation Age of Onset  . Stomach cancer Mother   . Heart Problems Father   . Dementia Father   . Cancer Father   . Bone cancer Brother   . Lung cancer Brother   . Cancer Daughter     Social History:   Social History   Socioeconomic History  . Marital status: Unknown    Spouse name: Not on file  . Number of children: Not on file  . Years of education: Not on file  . Highest education level: Not on file  Occupational History  . Not on file  Social  Needs  . Financial resource strain: Not on file  . Food insecurity:    Worry: Not on file    Inability: Not on file  . Transportation needs:    Medical: Not on file    Non-medical: Not on file  Tobacco Use  . Smoking status: Never Smoker  . Smokeless tobacco: Never Used  Substance and Sexual Activity  . Alcohol use: Yes    Comment: occ  . Drug use: No  . Sexual activity: Never  Lifestyle  . Physical activity:    Days per week: Not on file    Minutes per session: Not on file  . Stress: Not on file  Relationships  . Social connections:    Talks on phone: Not on file    Gets together: Not on file    Attends religious service: Not on file    Active member of club or organization: Not on file    Attends meetings of clubs or organizations: Not on file    Relationship status: Not on file  Other Topics Concern  . 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   Takes time release morphine b/c fell 20 feet from a building while working.    Exercises/walks.   Eats all food groups.   Has pets at home.   Does not smoke.   No tobacco products or drugs.     Additional Social History: ***  Allergies:   Allergies  Allergen  Reactions  . Codeine Other (See Comments)    Slurred Speech   . Other     [place], sweat potatoes,   . Penicillins Rash    Metabolic Disorder Labs: Lab Results  Component Value Date   HGBA1C 9.6 (H) 02/05/2017   MPG 229 02/05/2017   No results found for: PROLACTIN Lab Results  Component Value Date   CHOL 157 02/05/2017   TRIG 185 (H) 02/05/2017   HDL 42 02/05/2017   CHOLHDL 3.7 02/05/2017   LDLCALC 87 02/05/2017   Lab Results  Component Value Date   TSH 1.98 02/05/2017    Therapeutic Level Labs: No results found for: LITHIUM No results found for: CBMZ No results found for: VALPROATE  Current Medications: Current Outpatient Medications  Medication Sig Dispense Refill  . ALPRAZolam (XANAX) 0.5 MG tablet Take  1 tablet (0.5 mg total) by mouth 2 (two) times daily as needed for anxiety. 60 tablet 0  . dapagliflozin propanediol (FARXIGA) 10 MG TABS tablet Take 10 mg by mouth daily.    . DULoxetine (CYMBALTA) 60 MG capsule Take 60 mg by mouth daily.  1  . fluticasone (FLONASE) 50 MCG/ACT nasal spray USE ONE SPRAY in each nostril ONCE EVERY DAY  0  . glipiZIDE (GLUCOTROL XL) 10 MG 24 hr tablet Take 10 mg by mouth daily with breakfast.    . insulin glargine (LANTUS) 100 UNIT/ML injection Inject 0.5 mLs (50 Units total) into the skin at bedtime. 10 mL 0  . levothyroxine (SYNTHROID, LEVOTHROID) 150 MCG tablet Take 150 mcg by mouth daily before breakfast.    . meloxicam (MOBIC) 7.5 MG tablet Take 1 tablet (7.5 mg total) by mouth daily. 30 tablet 0  . metFORMIN (GLUCOPHAGE) 1000 MG tablet Take 1,000 mg by mouth 2 (two) times daily.  3  . morphine (MS CONTIN) 30 MG 12 hr tablet Take 1 tablet by mouth 2 (two) times daily. Take with the      . morphine (MS CONTIN) 60 MG 12 hr tablet Take 1 tablet by mouth 2 (two) times daily. Takes with the      . simvastatin (ZOCOR) 40 MG tablet TAKE 1 TABLET BY MOUTH IN THE EVENING ONCE A DAILY EMERGENCY REFILL FAXED DR  3  . tamsulosin (FLOMAX) 0.4 MG CAPS capsule AS DIRECTED 30 MINUTES AFTER THE SAME MEAL EACH DAY.  3   No current facility-administered medications for this visit.     Musculoskeletal: Strength & Muscle Tone: within normal limits Gait & Station: normal Patient leans: N/A  Psychiatric Specialty Exam: ROS  There were no vitals taken for this visit.There is no height or weight on file to calculate BMI.  General Appearance: Fairly Groomed  Eye Contact:  Good  Speech:  Clear and Coherent  Volume:  Normal  Mood:  {BHH MOOD:22306}  Affect:  {Affect (PAA):22687}  Thought Process:  Coherent  Orientation:  Full (Time, Place, and Person)  Thought Content:  Logical  Suicidal Thoughts:  {ST/HT (PAA):22692}  Homicidal Thoughts:  {ST/HT (PAA):22692}   Memory:  Immediate;   Good  Judgement:  {Judgement (PAA):22694}  Insight:  {Insight (PAA):22695}  Psychomotor Activity:  Normal  Concentration:  Concentration: Good and Attention Span: Good  Recall:  Good  Fund of Knowledge:Good  Language: Good  Akathisia:  No  Handed:  Right  AIMS (if indicated):  not done  Assets:  Communication Skills Desire for Improvement  ADL's:  Intact  Cognition: WNL  Sleep:  {BHH GOOD/FAIR/POOR:22877}   Screenings: UJW1-1  Office Visit from 02/05/2017 in [facility]  PHQ-2 Total Score  3  PHQ-9 Total Score  8      Assessment and Plan:    Plan  The patient demonstrates the following risk factors for suicide: Chronic risk factors for suicide include: {Chronic Risk Factors for XBDZHGD:92426834}. Acute risk factors for suicide include: {Acute Risk Factors for HDQQIWL:79892119}. Protective factors for this patient include: {Protective Factors for Suicide ERDE:08144818}. Considering these factors, the overall suicide risk at this point appears to be {Desc; low/moderate/high:110033}. Patient {ACTION; IS/IS HUD:14970263} appropriate for outpatient follow up.    Neysa Hotter, MD 3/31/20209:45 AM

## 2018-05-31 ENCOUNTER — Ambulatory Visit (HOSPITAL_COMMUNITY): Payer: Self-pay | Admitting: Psychiatry

## 2018-06-20 NOTE — Progress Notes (Deleted)
Psychiatric Initial Adult Assessment   Patient Identification: Thomas Rollins MRN:  [id number] Date of Evaluation:  06/20/2018 Referral Source: *** Chief Complaint:   Visit Diagnosis: No diagnosis found.  History of Present Illness:   Thomas Rollins is a 58 y.o. year old male with a history of depression, type II diabetes, hypertension, hyperlipidemia, who presents for follow up appointment for No diagnosis found.   Xanax filled on 3/20, 0.5 mg BID for 14 days,  On morphine  Associated Signs/Symptoms: Depression Symptoms:  {DEPRESSION SYMPTOMS:20000} (Hypo) Manic Symptoms:  {BHH MANIC SYMPTOMS:22872} Anxiety Symptoms:  {BHH ANXIETY SYMPTOMS:22873} Psychotic Symptoms:  {BHH PSYCHOTIC SYMPTOMS:22874} PTSD Symptoms: {BHH PTSD SYMPTOMS:22875}  Past Psychiatric History:  Outpatient:  Psychiatry admission:  Previous suicide attempt:  Past trials of medication:  History of violence:   Previous Psychotropic Medications: {YES/NO:21197}  Substance Abuse History in the last 12 months:  {yes no:314532}  Consequences of Substance Abuse: {BHH CONSEQUENCES OF SUBSTANCE ABUSE:22880}  Past Medical History:  Past Medical History:  Diagnosis Date  . Chronic pain   . Depression   . Diabetes mellitus without complication (HCC)   . Hyperlipidemia   . Hypertension     Past Surgical History:  Procedure Laterality Date  . ankle sx    . ARTHROSCOPIC REPAIR ACL    . CARPAL TUNNEL RELEASE    . HERNIA REPAIR    . SHOULDER ARTHROSCOPY      Family Psychiatric History: ***  Family History:  Family History  Problem Relation Age of Onset  . Stomach cancer Mother   . Heart Problems Father   . Dementia Father   . Cancer Father   . Bone cancer Brother   . Lung cancer Brother   . Cancer Daughter     Social History:   Social History   Socioeconomic History  . Marital status: Unknown    Spouse name: Not on file  . Number of children: Not on file  . Years of education: Not on file  .  Highest education level: Not on file  Occupational History  . Not on file  Social Needs  . Financial resource strain: Not on file  . Food insecurity:    Worry: Not on file    Inability: Not on file  . Transportation needs:    Medical: Not on file    Non-medical: Not on file  Tobacco Use  . Smoking status: Never Smoker  . Smokeless tobacco: Never Used  Substance and Sexual Activity  . Alcohol use: Yes    Comment: occ  . Drug use: No  . Sexual activity: Never  Lifestyle  . Physical activity:    Days per week: Not on file    Minutes per session: Not on file  . Stress: Not on file  Relationships  . Social connections:    Talks on phone: Not on file    Gets together: Not on file    Attends religious service: Not on file    Active member of club or organization: Not on file    Attends meetings of clubs or organizations: Not on file    Relationship status: Not on file  Other Topics Concern  . 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   Takes time release morphine b/c fell 20 feet from a building while working.    Exercises/walks.   Eats all food groups.   Has pets at home.   Does not smoke.   No tobacco products  or drugs.     Additional Social History: ***  Allergies:   Allergies  Allergen Reactions  . Codeine Other (See Comments)    Slurred Speech   . Other     [place], sweat potatoes,   . Penicillins Rash    Metabolic Disorder Labs: Lab Results  Component Value Date   HGBA1C 9.6 (H) 02/05/2017   MPG 229 02/05/2017   No results found for: PROLACTIN Lab Results  Component Value Date   CHOL 157 02/05/2017   TRIG 185 (H) 02/05/2017   HDL 42 02/05/2017   CHOLHDL 3.7 02/05/2017   LDLCALC 87 02/05/2017   Lab Results  Component Value Date   TSH 1.98 02/05/2017    Therapeutic Level Labs: No results found for: LITHIUM No results found for: CBMZ No results found for: VALPROATE  Current Medications: Current Outpatient  Medications  Medication Sig Dispense Refill  . ALPRAZolam (XANAX) 0.5 MG tablet Take 1 tablet (0.5 mg total) by mouth 2 (two) times daily as needed for anxiety. 60 tablet 0  . dapagliflozin propanediol (FARXIGA) 10 MG TABS tablet Take 10 mg by mouth daily.    . DULoxetine (CYMBALTA) 60 MG capsule Take 60 mg by mouth daily.  1  . fluticasone (FLONASE) 50 MCG/ACT nasal spray USE ONE SPRAY in each nostril ONCE EVERY DAY  0  . glipiZIDE (GLUCOTROL XL) 10 MG 24 hr tablet Take 10 mg by mouth daily with breakfast.    . insulin glargine (LANTUS) 100 UNIT/ML injection Inject 0.5 mLs (50 Units total) into the skin at bedtime. 10 mL 0  . levothyroxine (SYNTHROID, LEVOTHROID) 150 MCG tablet Take 150 mcg by mouth daily before breakfast.    . meloxicam (MOBIC) 7.5 MG tablet Take 1 tablet (7.5 mg total) by mouth daily. 30 tablet 0  . metFORMIN (GLUCOPHAGE) 1000 MG tablet Take 1,000 mg by mouth 2 (two) times daily.  3  . morphine (MS CONTIN) 30 MG 12 hr tablet Take 1 tablet by mouth 2 (two) times daily. Take with the 60mg      . morphine (MS CONTIN) 60 MG 12 hr tablet Take 1 tablet by mouth 2 (two) times daily. Takes with the 30mg      . simvastatin (ZOCOR) 40 MG tablet TAKE 1 TABLET BY MOUTH IN THE EVENING ONCE A DAILY EMERGENCY REFILL FAXED DR  3  . tamsulosin (FLOMAX) 0.4 MG CAPS capsule AS DIRECTED 30 MINUTES AFTER THE SAME MEAL EACH DAY.  3   No current facility-administered medications for this visit.     Musculoskeletal: Strength & Muscle Tone: N/A Gait & Station: N.A Patient leans: N/A  Psychiatric Specialty Exam: ROS  There were no vitals taken for this visit.There is no height or weight on file to calculate BMI.  General Appearance: {Appearance:22683}  Eye Contact:  {BHH EYE CONTACT:22684}  Speech:  Clear and Coherent  Volume:  Normal  Mood:  {BHH MOOD:22306}  Affect:  {Affect (PAA):22687}  Thought Process:  Coherent  Orientation:  Full (Time, Place, and Person)  Thought Content:  Logical   Suicidal Thoughts:  {ST/HT (PAA):22692}  Homicidal Thoughts:  {ST/HT (PAA):22692}  Memory:  Immediate;   Good  Judgement:  {Judgement (PAA):22694}  Insight:  {Insight (PAA):22695}  Psychomotor Activity:  Normal  Concentration:  Concentration: Good and Attention Span: Good  Recall:  Good  Fund of Knowledge:Good  Language: Good  Akathisia:  No  Handed:  Right  AIMS (if indicated):  not done  Assets:  Communication Skills Desire for Improvement  ADL's:  Intact  Cognition: WNL  Sleep:  {BHH GOOD/FAIR/POOR:22877}   Screenings: PHQ2-9     Office Visit from 02/05/2017 in [facility]  PHQ-2 Total Score  3  PHQ-9 Total Score  8      Assessment and Plan:  Assessment  Plan  The patient demonstrates the following risk factors for suicide: Chronic risk factors for suicide include: {Chronic Risk Factors for HUTMLYY:50354656}. Acute risk factors for suicide include: {Acute Risk Factors for CLEXNTZ:00174944}. Protective factors for this patient include: {Protective Factors for Suicide HQPR:91638466}. Considering these factors, the overall suicide risk at this point appears to be {Desc; low/moderate/high:110033}. Patient {ACTION; IS/IS ZLD:35701779} appropriate for outpatient follow up.    Neysa Hotter, MD 4/27/202011:22 AM

## 2018-06-21 ENCOUNTER — Encounter (HOSPITAL_COMMUNITY): Payer: Medicare HMO | Admitting: Psychiatry

## 2018-06-21 ENCOUNTER — Other Ambulatory Visit: Payer: Self-pay

## 2018-06-22 ENCOUNTER — Telehealth (HOSPITAL_COMMUNITY): Payer: Self-pay | Admitting: *Deleted

## 2018-06-22 NOTE — Progress Notes (Signed)
This encounter was created in error - please disregard.

## 2018-06-22 NOTE — Telephone Encounter (Signed)
Dr Vanetta Shawl Patient stated that his car broke down yesterday As he was driving getting ready for his V V with you & when they came to tow his car he left his phone inside . He was very apologetic & asked if you  Could call him # [phone]

## 2018-06-22 NOTE — Telephone Encounter (Signed)
I understand. If he is interested in rescheduling, please do so. Please also discuss attendance policy.

## 2018-08-09 NOTE — Progress Notes (Deleted)
Psychiatric Initial Adult Assessment   Patient Identification: Thomas Rollins MRN:  [id number] Date of Evaluation:  08/09/2018 Referral Source: *** Chief Complaint:   Visit Diagnosis: No diagnosis found.  History of Present Illness:   Thomas Rollins is a 58 y.o. year old male with a history of depression, diabetes, hypertension, hyperlipidemia, who is referred for      Associated Signs/Symptoms: Depression Symptoms:  {DEPRESSION SYMPTOMS:20000} (Hypo) Manic Symptoms:  {BHH MANIC SYMPTOMS:22872} Anxiety Symptoms:  {BHH ANXIETY SYMPTOMS:22873} Psychotic Symptoms:  {BHH PSYCHOTIC SYMPTOMS:22874} PTSD Symptoms: {BHH PTSD SYMPTOMS:22875}  Past Psychiatric History:  Outpatient:  Psychiatry admission:  Previous suicide attempt:  Past trials of medication:  History of violence:   Previous Psychotropic Medications: {YES/NO:21197}  Substance Abuse History in the last 12 months:  {yes no:314532}  Consequences of Substance Abuse: {BHH CONSEQUENCES OF SUBSTANCE ABUSE:22880}  Past Medical History:  Past Medical History:  Diagnosis Date  . Chronic pain   . Depression   . Diabetes mellitus without complication ([place])   . Hyperlipidemia   . Hypertension     Past Surgical History:  Procedure Laterality Date  . ankle sx    . ARTHROSCOPIC REPAIR ACL    . CARPAL TUNNEL RELEASE    . HERNIA REPAIR    . SHOULDER ARTHROSCOPY      Family Psychiatric History: ***  Family History:  Family History  Problem Relation Age of Onset  . Stomach cancer Mother   . Heart Problems Father   . Dementia Father   . Cancer Father   . Bone cancer Brother   . Lung cancer Brother   . Cancer Daughter     Social History:   Social History   Socioeconomic History  . Marital status: Unknown    Spouse name: Not on file  . Number of children: Not on file  . Years of education: Not on file  . Highest education level: Not on file  Occupational History  . Not on file  Social Needs  . Financial  resource strain: Not on file  . Food insecurity    Worry: Not on file    Inability: Not on file  . Transportation needs    Medical: Not on file    Non-medical: Not on file  Tobacco Use  . Smoking status: Never Smoker  . Smokeless tobacco: Never Used  Substance and Sexual Activity  . Alcohol use: Yes    Comment: occ  . Drug use: No  . Sexual activity: Never  Lifestyle  . Physical activity    Days per week: Not on file    Minutes per session: Not on file  . Stress: Not on file  Relationships  . Social Herbalist on phone: Not on file    Gets together: Not on file    Attends religious service: Not on file    Active member of club or organization: Not on file    Attends meetings of clubs or organizations: Not on file    Relationship status: Not on file  Other Topics Concern  . Not on file  Social History Narrative   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   Takes time release morphine b/c fell 20 feet from a building while working.    Exercises/walks.   Eats all food groups.   Has pets at home.   Does not smoke.   No tobacco products or drugs.     Additional Social History: ***  Allergies:   Allergies  Allergen Reactions  .  Codeine Other (See Comments)    Slurred Speech   . Other     [place], sweat potatoes,   . Penicillins Rash    Metabolic Disorder Labs: Lab Results  Component Value Date   HGBA1C 9.6 (H) 02/05/2017   MPG 229 02/05/2017   No results found for: PROLACTIN Lab Results  Component Value Date   CHOL 157 02/05/2017   TRIG 185 (H) 02/05/2017   HDL 42 02/05/2017   CHOLHDL 3.7 02/05/2017   LDLCALC 87 02/05/2017   Lab Results  Component Value Date   TSH 1.98 02/05/2017    Therapeutic Level Labs: No results found for: LITHIUM No results found for: CBMZ No results found for: VALPROATE  Current Medications: Current Outpatient Medications  Medication Sig Dispense Refill  . ALPRAZolam (XANAX) 0.5 MG tablet Take 1 tablet (0.5 mg total)  by mouth 2 (two) times daily as needed for anxiety. 60 tablet 0  . dapagliflozin propanediol (FARXIGA) 10 MG TABS tablet Take 10 mg by mouth daily.    . DULoxetine (CYMBALTA) 60 MG capsule Take 60 mg by mouth daily.  1  . fluticasone (FLONASE) 50 MCG/ACT nasal spray USE ONE SPRAY in each nostril ONCE EVERY DAY  0  . glipiZIDE (GLUCOTROL XL) 10 MG 24 hr tablet Take 10 mg by mouth daily with breakfast.    . insulin glargine (LANTUS) 100 UNIT/ML injection Inject 0.5 mLs (50 Units total) into the skin at bedtime. 10 mL 0  . levothyroxine (SYNTHROID, LEVOTHROID) 150 MCG tablet Take 150 mcg by mouth daily before breakfast.    . meloxicam (MOBIC) 7.5 MG tablet Take 1 tablet (7.5 mg total) by mouth daily. 30 tablet 0  . metFORMIN (GLUCOPHAGE) 1000 MG tablet Take 1,000 mg by mouth 2 (two) times daily.  3  . morphine (MS CONTIN) 30 MG 12 hr tablet Take 1 tablet by mouth 2 (two) times daily. Take with the 60mg      . morphine (MS CONTIN) 60 MG 12 hr tablet Take 1 tablet by mouth 2 (two) times daily. Takes with the 30mg      . simvastatin (ZOCOR) 40 MG tablet TAKE 1 TABLET BY MOUTH IN THE EVENING ONCE A DAILY EMERGENCY REFILL FAXED DR  3  . tamsulosin (FLOMAX) 0.4 MG CAPS capsule AS DIRECTED 30 MINUTES AFTER THE SAME MEAL EACH DAY.  3   No current facility-administered medications for this visit.     Musculoskeletal: Strength & Muscle Tone: N/A Gait & Station: N/A Patient leans: N/A  Psychiatric Specialty Exam: ROS  There were no vitals taken for this visit.There is no height or weight on file to calculate BMI.  General Appearance: {Appearance:22683}  Eye Contact:  {BHH EYE CONTACT:22684}  Speech:  Clear and Coherent  Volume:  Normal  Mood:  {BHH MOOD:22306}  Affect:  {Affect (PAA):22687}  Thought Process:  Coherent  Orientation:  Full (Time, Place, and Person)  Thought Content:  Logical  Suicidal Thoughts:  {ST/HT (PAA):22692}  Homicidal Thoughts:  {ST/HT (PAA):22692}  Memory:  Immediate;    Good  Judgement:  {Judgement (PAA):22694}  Insight:  {Insight (PAA):22695}  Psychomotor Activity:  Normal  Concentration:  Concentration: Good and Attention Span: Good  Recall:  Good  Fund of Knowledge:Good  Language: Good  Akathisia:  No  Handed:  Right  AIMS (if indicated):  not done  Assets:  Communication Skills Desire for Improvement  ADL's:  Intact  Cognition: WNL  Sleep:  {BHH GOOD/FAIR/POOR:22877}   Screenings: ZOX0-9PHQ2-9  Office Visit from 02/05/2017 in [facility]  PHQ-2 Total Score  3  PHQ-9 Total Score  8      Assessment and Plan:    Plan  The patient demonstrates the following risk factors for suicide: Chronic risk factors for suicide include: {Chronic Risk Factors for ZOXWRUE:45409811}Suicide:30414011}. Acute risk factors for suicide include: {Acute Risk Factors for BJYNWGN:56213086}Suicide:30414012}. Protective factors for this patient include: {Protective Factors for Suicide VHQI:69629528}Risk:30414013}. Considering these factors, the overall suicide risk at this point appears to be {Desc; low/moderate/high:110033}. Patient {ACTION; IS/IS UXL:24401027}OT:21021397} appropriate for outpatient follow up.     Neysa Hottereina Jodette Wik, MD 6/16/20202:30 PM

## 2018-08-15 ENCOUNTER — Ambulatory Visit (HOSPITAL_COMMUNITY): Payer: Medicare HMO | Admitting: Psychiatry

## 2018-08-15 ENCOUNTER — Other Ambulatory Visit: Payer: Self-pay

## 2018-08-15 ENCOUNTER — Telehealth (HOSPITAL_COMMUNITY): Payer: Self-pay | Admitting: Psychiatry

## 2018-08-15 NOTE — Telephone Encounter (Signed)
[email or web address] did not work with the patient, although the patient was available on the phone. He denies SI. Will reschedule the appointment.  Emergency resources which includes 911, ED, suicide crisis line [phone]) are discussed.
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
## 2018-09-22 ENCOUNTER — Other Ambulatory Visit: Payer: Self-pay

## 2018-09-22 ENCOUNTER — Ambulatory Visit (HOSPITAL_COMMUNITY): Payer: Medicare HMO | Admitting: Psychiatry

## 2018-09-22 ENCOUNTER — Telehealth (HOSPITAL_COMMUNITY): Payer: Self-pay | Admitting: Psychiatry

## 2018-09-22 NOTE — Telephone Encounter (Signed)
Sent link for video visit through [facility]. Patient did not sign in. Called the patient  twice for appointment scheduled today. The patient did not answer the phone. Left voice message to contact the office.  

## 2018-11-01 ENCOUNTER — Ambulatory Visit (HOSPITAL_COMMUNITY): Payer: Medicare HMO | Admitting: Psychiatry
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
## 2018-11-23 ENCOUNTER — Encounter (INDEPENDENT_AMBULATORY_CARE_PROVIDER_SITE_OTHER): Payer: Self-pay | Admitting: Ophthalmology

## 2018-11-23 ENCOUNTER — Ambulatory Visit (INDEPENDENT_AMBULATORY_CARE_PROVIDER_SITE_OTHER): Payer: Medicare HMO | Admitting: Ophthalmology

## 2018-11-23 ENCOUNTER — Other Ambulatory Visit: Payer: Self-pay

## 2018-11-23 VITALS — BP 135/76 | HR 74

## 2018-11-23 DIAGNOSIS — H4710 Unspecified papilledema: Secondary | ICD-10-CM

## 2018-11-23 DIAGNOSIS — H35033 Hypertensive retinopathy, bilateral: Secondary | ICD-10-CM

## 2018-11-23 DIAGNOSIS — H3581 Retinal edema: Secondary | ICD-10-CM

## 2018-11-23 DIAGNOSIS — I10 Essential (primary) hypertension: Secondary | ICD-10-CM

## 2018-11-23 DIAGNOSIS — H469 Unspecified optic neuritis: Secondary | ICD-10-CM

## 2018-11-23 DIAGNOSIS — E119 Type 2 diabetes mellitus without complications: Secondary | ICD-10-CM | POA: Diagnosis not present

## 2018-11-23 DIAGNOSIS — H25813 Combined forms of age-related cataract, bilateral: Secondary | ICD-10-CM

## 2019-01-23 ENCOUNTER — Other Ambulatory Visit (HOSPITAL_BASED_OUTPATIENT_CLINIC_OR_DEPARTMENT_OTHER): Payer: Self-pay

## 2019-01-23 ENCOUNTER — Ambulatory Visit: Payer: Medicare HMO | Admitting: Family Medicine

## 2019-01-23 DIAGNOSIS — G4710 Hypersomnia, unspecified: Secondary | ICD-10-CM

## 2019-01-23 DIAGNOSIS — R0683 Snoring: Secondary | ICD-10-CM

## 2019-01-23 DIAGNOSIS — G4730 Sleep apnea, unspecified: Secondary | ICD-10-CM

## 2019-02-01 ENCOUNTER — Other Ambulatory Visit: Payer: Self-pay

## 2019-02-01 ENCOUNTER — Ambulatory Visit: Payer: Medicare HMO | Attending: Pulmonary Disease | Admitting: Neurology

## 2019-02-01 DIAGNOSIS — G4730 Sleep apnea, unspecified: Secondary | ICD-10-CM | POA: Diagnosis present

## 2019-02-01 DIAGNOSIS — R0683 Snoring: Secondary | ICD-10-CM

## 2019-02-01 DIAGNOSIS — G4710 Hypersomnia, unspecified: Secondary | ICD-10-CM | POA: Insufficient documentation

## 2019-02-21 NOTE — Procedures (Signed)
   Racine A. Merlene Laughter, MD     [email or web address]             NOCTURNAL POLYSOMNOGRAPHY   LOCATION: [facility]   Patient Name: Thomas Rollins, Thomas Rollins Date: 02/01/2019 Gender: Male D.O.B: [birth year] Age (years): 58 Referring Provider: Sinda Du Height (inches): 21 Interpreting Physician: Phillips Odor MD, ABSM Weight (lbs): 209 RPSGT: Rosebud Poles BMI: 33 MRN: [id number] Neck Size: CLINICAL INFORMATION Sleep Study Type: HST     Indication for sleep study: Excessive Daytime Sleepiness, Snoring, Witnessed Apneas     Epworth Sleepiness Score: NA  SLEEP STUDY TECHNIQUE A multi-channel overnight portable sleep study was performed. The channels recorded were: nasal airflow, thoracic respiratory movement, and oxygen saturation with a pulse oximetry. Snoring was also monitored.  MEDICATIONS Patient self administered medications include: N/A.  Current Outpatient Medications:  .  ALPRAZolam (XANAX) 0.5 MG tablet, Take 1 tablet (0.5 mg total) by mouth 2 (two) times daily as needed for anxiety., Disp: 60 tablet, Rfl: 0 .  dapagliflozin propanediol (FARXIGA) 10 MG TABS tablet, Take 10 mg by mouth daily., Disp: , Rfl:  .  DULoxetine (CYMBALTA) 60 MG capsule, Take 60 mg by mouth daily., Disp: , Rfl: 1 .  fluticasone (FLONASE) 50 MCG/ACT nasal spray, USE ONE SPRAY in each nostril ONCE EVERY DAY, Disp: , Rfl: 0 .  glipiZIDE (GLUCOTROL XL) 10 MG 24 hr tablet, Take 10 mg by mouth daily with breakfast., Disp: , Rfl:  .  insulin glargine (LANTUS) 100 UNIT/ML injection, Inject 0.5 mLs (50 Units total) into the skin at bedtime., Disp: 10 mL, Rfl: 0 .  levothyroxine (SYNTHROID, LEVOTHROID) 150 MCG tablet, Take 150 mcg by mouth daily before breakfast., Disp: , Rfl:  .  meloxicam (MOBIC) 7.5 MG tablet, Take 1 tablet (7.5 mg total) by mouth daily., Disp: 30 tablet, Rfl: 0 .  metFORMIN (GLUCOPHAGE) 1000 MG tablet, Take 1,000 mg by mouth 2 (two) times daily., Disp: ,  Rfl: 3 .  morphine (MS CONTIN) 30 MG 12 hr tablet, Take 1 tablet by mouth 2 (two) times daily. Take with the 60mg  , Disp: , Rfl:  .  morphine (MS CONTIN) 60 MG 12 hr tablet, Take 1 tablet by mouth 2 (two) times daily. Takes with the 30mg  , Disp: , Rfl:  .  simvastatin (ZOCOR) 40 MG tablet, TAKE 1 TABLET BY MOUTH IN THE EVENING ONCE A DAILY EMERGENCY REFILL FAXED DR, Disp: , Rfl: 3 .  tamsulosin (FLOMAX) 0.4 MG CAPS capsule, AS DIRECTED 30 MINUTES AFTER THE SAME MEAL EACH DAY., Disp: , Rfl: 3   SLEEP ARCHITECTURE Patient was studied for 558.9 minutes. The sleep efficiency was 84.7 % and the patient was supine for 53.9%. The arousal index was 0.0 per hour.  RESPIRATORY PARAMETERS The overall AHI was 6.4 per hour, with a central apnea index of 0.5 per hour.  The oxygen nadir was 73% during sleep.     CARDIAC DATA Mean heart rate during sleep was 70.5 bpm.  IMPRESSIONS Mild obstructive sleep apnea occurred during this study (AHI = 6.4/h). The severity does not require positive pressure treatment.   Delano Metz, MD Diplomate, American Board of Sleep Medicine.  ELECTRONICALLY SIGNED ON:  02/21/2019, 7:13 AM [place] PH: [phone]   FX: [phone] [place]

## 2019-03-23 ENCOUNTER — Ambulatory Visit: Payer: Medicare HMO | Admitting: Family Medicine

## 2019-03-29 ENCOUNTER — Ambulatory Visit: Payer: Medicare HMO | Admitting: Family Medicine

## 2019-04-05 ENCOUNTER — Encounter: Payer: Self-pay | Admitting: Family Medicine

## 2019-04-05 ENCOUNTER — Other Ambulatory Visit: Payer: Self-pay

## 2019-04-05 ENCOUNTER — Ambulatory Visit (INDEPENDENT_AMBULATORY_CARE_PROVIDER_SITE_OTHER): Payer: Medicare HMO | Admitting: Family Medicine

## 2019-04-05 ENCOUNTER — Encounter (INDEPENDENT_AMBULATORY_CARE_PROVIDER_SITE_OTHER): Payer: Self-pay | Admitting: *Deleted

## 2019-04-05 VITALS — BP 118/70 | HR 81 | Temp 98.2°F | Resp 15 | Ht 66.0 in | Wt 237.0 lb

## 2019-04-05 DIAGNOSIS — Z20828 Contact with and (suspected) exposure to other viral communicable diseases: Secondary | ICD-10-CM

## 2019-04-05 DIAGNOSIS — E559 Vitamin D deficiency, unspecified: Secondary | ICD-10-CM

## 2019-04-05 DIAGNOSIS — Z1211 Encounter for screening for malignant neoplasm of colon: Secondary | ICD-10-CM | POA: Diagnosis not present

## 2019-04-05 DIAGNOSIS — E119 Type 2 diabetes mellitus without complications: Secondary | ICD-10-CM | POA: Diagnosis not present

## 2019-04-05 DIAGNOSIS — Z1159 Encounter for screening for other viral diseases: Secondary | ICD-10-CM

## 2019-04-05 DIAGNOSIS — I10 Essential (primary) hypertension: Secondary | ICD-10-CM

## 2019-04-05 DIAGNOSIS — Z125 Encounter for screening for malignant neoplasm of prostate: Secondary | ICD-10-CM

## 2019-04-05 DIAGNOSIS — E1169 Type 2 diabetes mellitus with other specified complication: Secondary | ICD-10-CM

## 2019-04-05 DIAGNOSIS — E039 Hypothyroidism, unspecified: Secondary | ICD-10-CM

## 2019-04-05 DIAGNOSIS — Z794 Long term (current) use of insulin: Secondary | ICD-10-CM

## 2019-04-05 DIAGNOSIS — Z114 Encounter for screening for human immunodeficiency virus [HIV]: Secondary | ICD-10-CM

## 2019-04-05 DIAGNOSIS — E785 Hyperlipidemia, unspecified: Secondary | ICD-10-CM

## 2019-04-05 HISTORY — DX: Encounter for screening for other viral diseases: Z11.59

## 2019-04-05 NOTE — Progress Notes (Signed)
Subjective:  Patient ID: Thomas Rollins, male    DOB: 1960-07-13  Age: 59 y.o. MRN: [id number]  CC:  Chief Complaint  Patient presents with  . New Patient (Initial Visit)    establish care, needs labwork      HPI  HPI  Here to establish care.  Patient reports that he has had a very stressful past couple years.  He reports that his daughter age 72 years old died a couple months ago from a very rare type of cancer.  In addition, he reports that his brother died 17-May-2016. Reports that dealing with the death of his daughter is the hardest thing he has ever dealt with.  He reports that he has dealt with depression in the past but it has been worse since his daughter passed away.  He reports that he he promised his daughter that he would not hurt himself. Reports he has been treated for depression and anxiety.  Has been on the Cymbalta.  Reports that it works well.  Denies any auditory or visual hallucinations.  Denies any suicidal ideations or homicidal ideations.  Reports that he does feel down and is dealing with his grief.  Reports that he is tearful at times when he thinks about his daughter.  He goes to pain management clinic [facility] once a month.  They prescribed him his OxyContin for chronic pain. Other history includes hypertension, diabetes, hyperlipidemia.  He reports he has not had his labs in a while at least 3 months or more.  Did see Dr. Juanetta Gosling in the interim of seeing Dr. Tracie Harrier in reestablishing here.  He reports that he is open to getting his colonoscopy referral in addition he would like to have hepatitis C and AIDS screening as he did have exposure and would like to rule these out.    Reports taking all his medications as directed and without any issue.  Denies tobacco use.  Reports some occasional alcohol use.  Reports that he will smoke marijuana several times a year.  But not a daily event.  He tries to eat healthy but he knows he has not been eating as  healthy as he could because he has put on about 30 pounds in the last year.  Today patient denies signs and symptoms of COVID 19 infection including fever, chills, cough, shortness of breath, and headache. Past Medical, Surgical, Social History, Allergies, and Medications have been Reviewed.   Past Medical History:  Diagnosis Date  . Anxiety    panic attacks  . Chronic pain   . Depression   . Diabetes mellitus without complication (HCC)   . Hyperlipidemia   . Hypertension     Current Meds  Medication Sig  . dapagliflozin propanediol (FARXIGA) 10 MG TABS tablet Take 10 mg by mouth daily.  . DULoxetine (CYMBALTA) 60 MG capsule Take 60 mg by mouth daily.  . fluticasone (FLONASE) 50 MCG/ACT nasal spray USE ONE SPRAY in each nostril ONCE EVERY DAY  . glipiZIDE (GLUCOTROL XL) 10 MG 24 hr tablet Take 10 mg by mouth daily with breakfast.  . GLOBAL EASE INJECT PEN NEEDLES 31G X 8 MM MISC 1 each by Other route as directed.  . Lancets (ONETOUCH DELICA PLUS LANCET30G) MISC 1 each by Other route 3 (three) times daily.  [name] levothyroxine (SYNTHROID) 125 MCG tablet Take 125 mcg by mouth every morning.  . metFORMIN (GLUCOPHAGE) 1000 MG tablet Take 1,000 mg by mouth 2 (two) times daily.  [name]  morphine (MS CONTIN) 30 MG 12 hr tablet Take 1 tablet by mouth 2 (two) times daily. Take with the 60mg    . morphine (MS CONTIN) 60 MG 12 hr tablet Take 1 tablet by mouth 2 (two) times daily. Takes with the 30mg    . ONETOUCH VERIO test strip 1 each by Other route 3 (three) times daily.  . simvastatin (ZOCOR) 40 MG tablet TAKE 1 TABLET BY MOUTH IN THE EVENING ONCE A DAILY EMERGENCY REFILL FAXED DR  . FLEXTOUCH 200 UNIT/ML SOPN Inject 50-51 Units into the skin at bedtime.    ROS:  Review of Systems  Constitutional: Negative.   HENT: Negative.        No teeth  Does not wear dentures-previously didn't work due to gag reflex   Eyes: Negative.   Respiratory: Negative.   Cardiovascular: Negative.     Gastrointestinal: Negative.   Genitourinary: Negative.   Musculoskeletal: Negative.   Skin: Negative.   Neurological: Negative.   Endo/Heme/Allergies: Negative.   Psychiatric/Behavioral: The patient has insomnia.   All other systems reviewed and are negative.    Objective:   Today's Vitals: BP 118/70   Pulse 81   Temp 98.2 F (36.8 C) (Temporal)   Resp 15   Ht 5\' 6"  (1.676 m)   Wt 237 lb (107.5 kg)   SpO2 98%   BMI 38.25 kg/m  Vitals with BMI 04/05/2019 11/23/2018 09/30/2017  Height 5\' 6"  - -  Weight 237 lbs - -  BMI 38.27 - -  Systolic 118 135 06/03/2019  Diastolic 70 76 89  Pulse 81 74 81     Physical Exam Vitals and nursing note reviewed.  Constitutional:      Appearance: Normal appearance. He is well-developed and well-groomed. He is obese.  HENT:     Head: Normocephalic and atraumatic.     Right Ear: External ear normal.     Left Ear: External ear normal.     Mouth/Throat:     Comments: Mask in place Eyes:     General:        Right eye: No discharge.        Left eye: No discharge.     Conjunctiva/sclera: Conjunctivae normal.  Cardiovascular:     Rate and Rhythm: Normal rate and regular rhythm.     Pulses: Normal pulses.     Heart sounds: Normal heart sounds.  Pulmonary:     Effort: Pulmonary effort is normal.     Breath sounds: Normal breath sounds.  Musculoskeletal:        General: Normal range of motion.     Cervical back: Normal range of motion and neck supple.  Skin:    General: Skin is warm.  Neurological:     General: No focal deficit present.     Mental Status: He is alert and oriented to person, place, and time.  Psychiatric:        Attention and Perception: Attention normal.        Mood and Affect: Mood is anxious.        Speech: Speech normal.        Behavior: Behavior is hyperactive. Behavior is cooperative.        Thought Content: Thought content normal.        Cognition and Memory: Cognition normal.        Judgment: Judgment normal.      Assessment   1. Diabetes mellitus type 2, insulin dependent (HCC)   2. Hyperlipidemia associated with  type 2 diabetes mellitus ([place])   3. HTN, goal below 130/80   4. Encounter for screening for malignant neoplasm of colon   5. Encounter for screening for malignant neoplasm of prostate   6. Encounter for hepatitis C screening test for low risk patient   7. Hypothyroidism, unspecified type   8. Vitamin D deficiency   9. Encounter for HIV (human immunodeficiency virus) test   10. Exposure to herpes simplex virus (HSV)     Tests ordered Orders Placed This Encounter  Procedures  . PSA  . CBC  . COMPLETE METABOLIC PANEL WITH GFR  . HEP C AB W/REFL  . Lipid panel  . Microalbumin / creatinine urine ratio  . Hemoglobin A1c  . HIV Antibody (routine testing w rflx)  . HSV(herpes simplex vrs) 1+2 ab-IgG  . TSH  . VITAMIN D 25 Hydroxy (Vit-D Deficiency, Fractures)  . Ambulatory referral to Gastroenterology     Plan: Please see assessment and plan per problem list above.   No orders of the defined types were placed in this encounter.   Patient to follow-up in 3 months .  Perlie Mayo, NP

## 2019-04-05 NOTE — Patient Instructions (Addendum)
Happy New Year! May you have a year filled with hope, love, happiness and laughter.  I appreciate the opportunity to provide you with care for your health and wellness. Today we discussed: establish care  Follow up: 3 months   Labs placed today to get this week (fasting/nothing to eat) Referral for GI for colonoscopy   Please continue to practice social distancing to keep you, your family, and our community safe.  If you must go out, please wear a mask and practice good handwashing.  It was a pleasure to see you and I look forward to continuing to work together on your health and well-being. Please do not hesitate to call the office if you need care or have questions about your care.  Have a wonderful day and week. With Gratitude, Tereasa Coop, DNP, AGNP-BC

## 2019-04-06 ENCOUNTER — Telehealth: Payer: Self-pay

## 2019-04-06 DIAGNOSIS — E1169 Type 2 diabetes mellitus with other specified complication: Secondary | ICD-10-CM

## 2019-04-06 DIAGNOSIS — Z125 Encounter for screening for malignant neoplasm of prostate: Secondary | ICD-10-CM

## 2019-04-06 DIAGNOSIS — E119 Type 2 diabetes mellitus without complications: Secondary | ICD-10-CM

## 2019-04-06 DIAGNOSIS — Z1159 Encounter for screening for other viral diseases: Secondary | ICD-10-CM

## 2019-04-06 DIAGNOSIS — I10 Essential (primary) hypertension: Secondary | ICD-10-CM

## 2019-04-06 NOTE — Telephone Encounter (Signed)
Labs reordered for [facility]  

## 2019-04-08 LAB — CBC
Hematocrit: 49.9 % (ref 37.5–51.0)
Hemoglobin: 17.3 g/dL (ref 13.0–17.7)
MCH: 29.6 pg (ref 26.6–33.0)
MCHC: 34.7 g/dL (ref 31.5–35.7)
MCV: 85 fL (ref 79–97)
Platelets: 223 10*3/uL (ref 150–450)
RBC: 5.85 x10E6/uL — ABNORMAL HIGH (ref 4.14–5.80)
RDW: 14.3 % (ref 11.6–15.4)
WBC: 7.9 10*3/uL (ref 3.4–10.8)

## 2019-04-08 LAB — LIPID PANEL WITH LDL/HDL RATIO
Cholesterol, Total: 131 mg/dL (ref 100–199)
HDL: 45 mg/dL (ref >39)
LDL Chol Calc (NIH): 67 mg/dL (ref 0–99)
LDL/HDL Ratio: 1.5 ratio (ref 0.0–3.6)
Triglycerides: 101 mg/dL (ref 0–149)
VLDL Cholesterol Cal: 19 mg/dL (ref 5–40)

## 2019-04-08 LAB — CMP14+EGFR
ALT: 53 IU/L — ABNORMAL HIGH (ref 0–44)
AST: 39 IU/L (ref 0–40)
Albumin/Globulin Ratio: 2.5 — ABNORMAL HIGH (ref 1.2–2.2)
Albumin: 5.2 g/dL — ABNORMAL HIGH (ref 3.8–4.9)
Alkaline Phosphatase: 65 IU/L (ref 39–117)
BUN/Creatinine Ratio: 23 (ref 10–24)
BUN: 19 mg/dL (ref 8–27)
Bilirubin Total: 0.7 mg/dL (ref 0.0–1.2)
CO2: 23 mmol/L (ref 20–29)
Calcium: 10.3 mg/dL — ABNORMAL HIGH (ref 8.6–10.2)
Chloride: 100 mmol/L (ref 96–106)
Creatinine, Ser: 0.81 mg/dL (ref 0.76–1.27)
GFR calc Af Amer: 112 mL/min/{1.73_m2} (ref >59)
GFR calc non Af Amer: 97 mL/min/{1.73_m2} (ref >59)
Globulin, Total: 2.1 g/dL (ref 1.5–4.5)
Glucose: 150 mg/dL — ABNORMAL HIGH (ref 65–99)
Potassium: 4.6 mmol/L (ref 3.5–5.2)
Sodium: 140 mmol/L (ref 134–144)
Total Protein: 7.3 g/dL (ref 6.0–8.5)

## 2019-04-08 LAB — HCV COMMENT:

## 2019-04-08 LAB — MICROALBUMIN / CREATININE URINE RATIO
Creatinine, Urine: 81.2 mg/dL
Microalb/Creat Ratio: 13 mg/g creat (ref 0–29)
Microalbumin, Urine: 10.2 ug/mL

## 2019-04-08 LAB — PSA: Prostate Specific Ag, Serum: 1.8 ng/mL (ref 0.0–4.0)

## 2019-04-08 LAB — HEMOGLOBIN A1C
Est. average glucose Bld gHb Est-mCnc: 140 mg/dL
Hgb A1c MFr Bld: 6.5 % — ABNORMAL HIGH (ref 4.8–5.6)

## 2019-04-08 LAB — HEPATITIS C ANTIBODY (REFLEX): HCV Ab: <0.1 s/co ratio (ref 0.0–0.9)

## 2019-04-08 LAB — HIV ANTIBODY (ROUTINE TESTING W REFLEX): HIV Screen 4th Generation wRfx: NONREACTIVE

## 2019-04-10 ENCOUNTER — Telehealth: Payer: Self-pay

## 2019-04-10 NOTE — Telephone Encounter (Signed)
Patient states he had a really hard day yesterday with anxiety and panic attacks due to the holiday and losing his daughter. He is requesting anything to help with the panic attacks. Patient aware you are out of the office until tomorrow.

## 2019-04-11 DIAGNOSIS — E559 Vitamin D deficiency, unspecified: Secondary | ICD-10-CM | POA: Insufficient documentation

## 2019-04-11 DIAGNOSIS — Z20828 Contact with and (suspected) exposure to other viral communicable diseases: Secondary | ICD-10-CM | POA: Insufficient documentation

## 2019-04-11 DIAGNOSIS — E039 Hypothyroidism, unspecified: Secondary | ICD-10-CM | POA: Insufficient documentation

## 2019-04-11 DIAGNOSIS — Z114 Encounter for screening for human immunodeficiency virus [HIV]: Secondary | ICD-10-CM | POA: Insufficient documentation

## 2019-04-11 NOTE — Assessment & Plan Note (Addendum)
Exposure needs testing Educated on safe sex practices

## 2019-04-11 NOTE — Assessment & Plan Note (Signed)
Reports taking medication as directed Need updated labs  Ordered today. Educated on low fat DM friendly diet and need for exercise 5 day of the week for a 30 minute minimum.

## 2019-04-11 NOTE — Assessment & Plan Note (Signed)
Needs updated prostate level

## 2019-04-11 NOTE — Assessment & Plan Note (Signed)
Needs updated TSH level

## 2019-04-11 NOTE — Assessment & Plan Note (Signed)
Needs updated labs for possible need for treatment to be restarted.

## 2019-04-11 NOTE — Assessment & Plan Note (Signed)
Thomas Rollins is encouraged to maintain a well balanced diet that is low in salt. Controlled, continue current medication regimen.  No refills needed. Additionally, he is also reminded that exercise is beneficial for heart health and control of  Blood pressure. 30-60 minutes daily is recommended-walking was suggested.

## 2019-04-11 NOTE — Assessment & Plan Note (Signed)
[place] task force recommendation 

## 2019-04-11 NOTE — Telephone Encounter (Signed)
Patient scheduled for 2/17 at 10am for phone visit

## 2019-04-11 NOTE — Assessment & Plan Note (Signed)
Thomas Rollins is encouraged to check blood sugar daily as directed. Continue current medications. Is on statin as well. Educated on importance of maintain a well balanced diabetic friendly diet.  He is reminded the importance of maintaining  good blood sugars,  taking medications as directed, daily foot care, annual eye exams. Additionally educated about keeping good control over blood pressure and cholesterol as well.

## 2019-04-11 NOTE — Assessment & Plan Note (Signed)
Needs colon screening, open to referral.

## 2019-04-11 NOTE — Assessment & Plan Note (Addendum)
Exposure needs testing Educated on safe sex practices 

## 2019-04-12 ENCOUNTER — Encounter: Payer: Self-pay | Admitting: Family Medicine

## 2019-04-12 ENCOUNTER — Other Ambulatory Visit: Payer: Self-pay

## 2019-04-12 ENCOUNTER — Ambulatory Visit (INDEPENDENT_AMBULATORY_CARE_PROVIDER_SITE_OTHER): Payer: Medicare HMO | Admitting: Family Medicine

## 2019-04-12 DIAGNOSIS — F411 Generalized anxiety disorder: Secondary | ICD-10-CM | POA: Insufficient documentation

## 2019-04-12 DIAGNOSIS — F419 Anxiety disorder, unspecified: Secondary | ICD-10-CM

## 2019-04-12 DIAGNOSIS — F410 Panic disorder [episodic paroxysmal anxiety] without agoraphobia: Secondary | ICD-10-CM

## 2019-04-12 MED ORDER — BUSPIRONE HCL 10 MG PO TABS: 10.0000 mg | ORAL_TABLET | Freq: Three times a day (TID) | ORAL | 0 refills | Status: DC

## 2019-04-12 NOTE — Progress Notes (Signed)
Virtual Visit via Telephone Note   This visit type was conducted due to national recommendations for restrictions regarding the COVID-19 Pandemic (e.g. social distancing) in an effort to limit this patient's exposure and mitigate transmission in our community.  Due to his co-morbid illnesses, this patient is at least at moderate risk for complications without adequate follow up.  This format is felt to be most appropriate for this patient at this time.  The patient did not have access to video technology/had technical difficulties with video requiring transitioning to audio format only (telephone).  All issues noted in this document were discussed and addressed.  No physical exam could be performed with this format.    Evaluation Performed:  Follow-up visit  Date:  04/12/2019   ID:  Thomas Rollins, DOB 08/02/1960, MRN [id number]  Patient Location: Home Provider Location: Office  Location of Patient: Home Location of Provider: Telehealth Consent was obtain for visit to be over via telehealth. I verified that I am speaking with the correct person using two identifiers.  PCP:  Freddy Finner, NP   Chief Complaint:  Anxiety   History of Present Illness:    Thomas Rollins is a 59 y.o. male with history of anxiety, panic attacks, depression, diabetes, hyperlipidemia, hypertension.  Lost his daughter 2 years ago and reports that he just had some triggering responses around Valentine's Day thinking of her missing her and this caused him to have a full-blown panic attack where he almost went to the emergency room but did not.  He reports today for phone visit to see if he can get something to help prevent these panic attacks from occurring.  The patient does not have symptoms concerning for COVID-19 infection (fever, chills, cough, or new shortness of breath).   Past Medical, Surgical, Social History, Allergies, and Medications have been Reviewed.  Past Medical History:  Diagnosis Date  .  Anxiety    panic attacks  . Chronic pain   . Depression   . Diabetes mellitus without complication (HCC)   . Hyperlipidemia   . Hypertension    Past Surgical History:  Procedure Laterality Date  . ankle sx    . ARTHROSCOPIC REPAIR ACL    . CARPAL TUNNEL RELEASE    . HERNIA REPAIR    . SHOULDER ARTHROSCOPY       Current Meds  Medication Sig  . dapagliflozin propanediol (FARXIGA) 10 MG TABS tablet Take 10 mg by mouth daily.  . DULoxetine (CYMBALTA) 60 MG capsule Take 60 mg by mouth daily.  . fluticasone (FLONASE) 50 MCG/ACT nasal spray USE ONE SPRAY in each nostril ONCE EVERY DAY  . glipiZIDE (GLUCOTROL XL) 10 MG 24 hr tablet Take 10 mg by mouth daily with breakfast.  . GLOBAL EASE INJECT PEN NEEDLES 31G X 8 MM MISC 1 each by Other route as directed.  . Lancets (ONETOUCH DELICA PLUS LANCET30G) MISC 1 each by Other route 3 (three) times daily.  [name] levothyroxine (SYNTHROID) 125 MCG tablet Take 125 mcg by mouth every morning.  . metFORMIN (GLUCOPHAGE) 1000 MG tablet Take 1,000 mg by mouth 2 (two) times daily.  [name] morphine (MS CONTIN) 30 MG 12 hr tablet Take 1 tablet by mouth 2 (two) times daily. Take with the 60mg    . morphine (MS CONTIN) 60 MG 12 hr tablet Take 1 tablet by mouth 2 (two) times daily. Takes with the 30mg    . ONETOUCH VERIO test strip 1 each by Other route 3 (three) times  daily.  . simvastatin (ZOCOR) 40 MG tablet TAKE 1 TABLET BY MOUTH IN THE EVENING ONCE A DAILY EMERGENCY REFILL FAXED DR  . [name] FLEXTOUCH 200 UNIT/ML SOPN Inject 50-51 Units into the skin at bedtime.     Allergies:   Codeine, Other, and Penicillins   ROS:   Please see the history of present illness.    All other systems reviewed and are negative.   Labs/Other Tests and Data Reviewed:    Recent Labs: 04/06/2019: ALT 53; BUN 19; Creatinine, Ser 0.81; Hemoglobin 17.3; Platelets 223; Potassium 4.6; Sodium 140   Recent Lipid Panel Lab Results  Component Value Date/Time   CHOL 131 04/06/2019  10:00 AM   TRIG 101 04/06/2019 10:00 AM   HDL 45 04/06/2019 10:00 AM   CHOLHDL 3.7 02/05/2017 11:01 AM   LDLCALC 67 04/06/2019 10:00 AM   LDLCALC 87 02/05/2017 11:01 AM    Wt Readings from Last 3 Encounters:  04/05/19 237 lb (107.5 kg)  09/30/17 260 lb (117.9 kg)  09/05/17 254 lb (115.2 kg)     Objective:    Vital Signs:  There were no vitals taken for this visit.   GEN:  no acute distress RESPIRATORY:  no shortness of breath noted in conversation  PSYCH:  normal affect  GAD 7 : Generalized Anxiety Score 04/12/2019  Nervous, Anxious, on Edge 1  Control/stop worrying 0  Worry too much - different things 0  Trouble relaxing 0  Restless 0  Easily annoyed or irritable 1  Afraid - awful might happen 3  Total GAD 7 Score 5  Anxiety Difficulty Somewhat difficult     ASSESSMENT & PLAN:    1. Anxiety  - busPIRone (BUSPAR) 10 MG tablet; Take 1 tablet (10 mg total) by mouth 3 (three) times daily.  Dispense: 90 tablet; Refill: 0  2. Panic attacks  - busPIRone (BUSPAR) 10 MG tablet; Take 1 tablet (10 mg total) by mouth 3 (three) times daily.  Dispense: 90 tablet; Refill: 0  Time:   Today, I have spent 10 minutes with the patient with telehealth technology discussing the above problems.     Medication Adjustments/Labs and Tests Ordered: Current medicines are reviewed at length with the patient today.  Concerns regarding medicines are outlined above.   Tests Ordered: No orders of the defined types were placed in this encounter.   Medication Changes: No orders of the defined types were placed in this encounter.   Disposition:  Follow up 12 weeks by phone  Signed, Perlie Mayo, NP  04/12/2019 10:39 AM     [place]

## 2019-04-12 NOTE — Patient Instructions (Addendum)
Happy New Year! May you have a year filled with hope, love, happiness and laughter.  I appreciate the opportunity to provide you with care for your health and wellness. Today we discussed: anxiety and panic attacks  Follow up: 12 weeks for anxiety by phone   No labs or referrals today  Please use the buspar as needed up to 3 times a day. I do hope you have relief from your anxiety.  Please continue to practice social distancing to keep you, your family, and our community safe.  If you must go out, please wear a mask and practice good handwashing.  It was a pleasure to see you and I look forward to continuing to work together on your health and well-being. Please do not hesitate to call the office if you need care or have questions about your care.  Have a wonderful day and week. With Gratitude, Tereasa Coop, DNP, AGNP-BC

## 2019-04-12 NOTE — Assessment & Plan Note (Signed)
Panic attacks- GAD 5, prescribed buspar to help. Might need therapy and or adjustment to depression medication in future. I do not prescribe xanax or benzos for panic attacks.  I will gladly refer if need be.  Complicated grief might be what we are looking at.  

## 2019-04-12 NOTE — Assessment & Plan Note (Signed)
Panic attacks- GAD 5, prescribed buspar to help. Might need therapy and or adjustment to depression medication in future. I do not prescribe xanax or benzos for panic attacks.  I will gladly refer if need be.  Complicated grief might be what we are looking at.

## 2019-04-21 ENCOUNTER — Other Ambulatory Visit: Payer: Self-pay

## 2019-04-21 DIAGNOSIS — F410 Panic disorder [episodic paroxysmal anxiety] without agoraphobia: Secondary | ICD-10-CM

## 2019-04-21 DIAGNOSIS — F419 Anxiety disorder, unspecified: Secondary | ICD-10-CM

## 2019-04-21 MED ORDER — BUSPIRONE HCL 10 MG PO TABS: 10.0000 mg | ORAL_TABLET | Freq: Three times a day (TID) | ORAL | 1 refills | Status: DC

## 2019-04-24 ENCOUNTER — Telehealth: Payer: Self-pay

## 2019-04-24 NOTE — Telephone Encounter (Signed)
Pt is calling to get a script sent over for Diabetic shoes- sent to [facility]

## 2019-04-24 NOTE — Telephone Encounter (Signed)
Apothecary requires a foot exam no older than 6 months be faxed with the rx. I don't see where he has had one

## 2019-05-10 NOTE — Telephone Encounter (Signed)
LVM for the PT that we needed a foot exam completed for the Diabetic shoes, I have updated the [facility] Appt @ 10am for a foot exam also, and advise the pt to call me back if he wanted to be seen sooner.

## 2019-05-11 NOTE — Telephone Encounter (Signed)
Scheduled for 3/31 

## 2019-05-24 ENCOUNTER — Other Ambulatory Visit: Payer: Self-pay

## 2019-05-24 ENCOUNTER — Encounter: Payer: Self-pay | Admitting: Family Medicine

## 2019-05-24 ENCOUNTER — Ambulatory Visit (INDEPENDENT_AMBULATORY_CARE_PROVIDER_SITE_OTHER): Payer: Medicare HMO | Admitting: Family Medicine

## 2019-05-24 VITALS — BP 118/80 | HR 75 | Temp 97.4°F | Resp 15 | Ht 67.0 in | Wt 248.0 lb

## 2019-05-24 DIAGNOSIS — Z794 Long term (current) use of insulin: Secondary | ICD-10-CM | POA: Diagnosis not present

## 2019-05-24 DIAGNOSIS — E119 Type 2 diabetes mellitus without complications: Secondary | ICD-10-CM

## 2019-05-24 NOTE — Assessment & Plan Note (Signed)
DM detailed foot exam today. Sent to [facility] for shoes.

## 2019-05-24 NOTE — Progress Notes (Signed)
Subjective:  Patient ID: Thomas Rollins, male    DOB: [birth year]  Age: 59 y.o. MRN: [id number]  CC:  Chief Complaint  Patient presents with  . Diabetes      HPI  HPI  Thomas Rollins is a 59 year old male patient who presents today for follow-up for diabetic shoes. Denies having any other concerns to discuss today.  Is wearing new balance tennis shoes that are fitting appropriately.  Has appropriate sock wear.  Reports that he wears socks or shoes around the house regularly and does not go barefoot.  Denies having any injury to either of his feet.  In the past he had an injury to the right great toe which caused him to have to have his toenail removed.  He does have a little bit of fungal changes on the toenails.  Has been seen by podiatry multiple times.  Had injections in the bottom of his feet.  He declines going back to the podiatrist ever again. He reports some numbness and tingling across the bottom of his feet.  Predominantly worse on the left than the right.  Has not had any falls recently.  Reports taking all his medications as directed.  Last A1c is well controlled at 6.5%.   Today patient denies signs and symptoms of COVID 19 infection including fever, chills, cough, shortness of breath, and headache. Past Medical, Surgical, Social History, Allergies, and Medications have been Reviewed.   Past Medical History:  Diagnosis Date  . Anxiety    panic attacks  . Chronic pain   . Depression   . Diabetes mellitus without complication (HCC)   . Hyperlipidemia   . Hypertension     No outpatient medications have been marked as taking for the 05/24/19 encounter (Office Visit) with Freddy Finner, NP.    ROS:  Review of Systems  Constitutional: Negative.   HENT: Negative.   Eyes: Negative.   Respiratory: Negative.   Cardiovascular: Negative.   Gastrointestinal: Negative.   Genitourinary: Negative.   Musculoskeletal: Negative.   Skin: Negative.   Neurological: Negative.     Endo/Heme/Allergies: Negative.   Psychiatric/Behavioral: Negative.   All other systems reviewed and are negative.    Objective:   Today's Vitals: BP 118/80   Pulse 75   Temp (!) 97.4 F (36.3 C) (Temporal)   Resp 15   Ht 5\' 7"  (1.702 m)   Wt 248 lb (112.5 kg)   SpO2 95%   BMI 38.84 kg/m  Vitals with BMI 05/24/2019 04/05/2019 11/23/2018  Height 5\' 7"  5\' 6"  -  Weight 248 lbs 237 lbs -  BMI 38.83 38.27 -  Systolic 118 118 11/25/2018  Diastolic 80 70 76  Pulse 75 81 74     Physical Exam Vitals and nursing note reviewed.  Constitutional:      Appearance: Normal appearance. He is well-developed and well-groomed. He is obese.  HENT:     Head: Normocephalic and atraumatic.     Right Ear: External ear normal.     Left Ear: External ear normal.     Mouth/Throat:     Comments: Mask in place  Eyes:     General:        Right eye: No discharge.        Left eye: No discharge.     Conjunctiva/sclera: Conjunctivae normal.  Cardiovascular:     Rate and Rhythm: Normal rate and regular rhythm.     Pulses:  Dorsalis pedis pulses are 1+ on the right side and 1+ on the left side.       Posterior tibial pulses are 2+ on the right side and 2+ on the left side.     Heart sounds: Normal heart sounds.  Pulmonary:     Effort: Pulmonary effort is normal.     Breath sounds: Normal breath sounds.  Musculoskeletal:        General: Normal range of motion.     Cervical back: Normal range of motion and neck supple.     Right foot: Normal range of motion.     Left foot: Normal range of motion.  Feet:     Right foot:     Protective Sensation: 5 sites tested. 5 sites sensed.     Skin integrity: Blister and callus present.     Toenail Condition: Fungal disease present.    Left foot:     Protective Sensation: 5 sites tested. 3 sites sensed.     Skin integrity: Skin integrity normal.     Toenail Condition: Fungal disease present.    Comments: Partially missing toenail of great toe on the  right Skin:    General: Skin is warm.  Neurological:     General: No focal deficit present.     Mental Status: He is alert and oriented to person, place, and time.  Psychiatric:        Attention and Perception: Attention normal.        Mood and Affect: Mood normal.        Speech: Speech normal.        Behavior: Behavior normal. Behavior is cooperative.        Thought Content: Thought content normal.        Cognition and Memory: Cognition normal.        Judgment: Judgment normal.    Diabetic Foot Form - Detailed   Diabetic Foot Exam - detailed Diabetic Foot exam was performed with the following findings: Yes 05/24/2019 11:55 AM  Can the patient see the bottom of their feet?: Yes Are the shoes appropriate in style and fit?: Yes Is there swelling or and abnormal foot shape?: No Is there a claw toe deformity?: No Is there elevated skin temparature?: No Is there foot or ankle muscle weakness?: No Normal Range of Motion: Yes Pulse Foot Exam completed.: Yes  Right posterior Tibialias: Diminished Left posterior Tibialias: Present, Diminished  Right Dorsalis Pedis: Present Left Dorsalis Pedis: Present  Sensory Foot Exam Completed.: Yes Semmes-Weinstein Monofilament Test R Site 1-Great Toe: Pos L Site 1-Great Toe: Pos    Comments: Limited sensory on bottom on the left foot near heel.      Assessment   1. Diabetes mellitus type 2, insulin dependent ([place])     Tests ordered No orders of the defined types were placed in this encounter.    Plan: Please see assessment and plan per problem list above.   No orders of the defined types were placed in this encounter.   Patient to follow-up in Visit date not found   Perlie Mayo, NP

## 2019-05-24 NOTE — Patient Instructions (Signed)
I appreciate the opportunity to provide you with care for your health and wellness. Today we discussed: diabetic shoes   Follow up: 6 months   No labs or referrals today  Form will be sent for diabetic shoes to [facility].   Please continue to practice social distancing to keep you, your family, and our community safe.  If you must go out, please wear a mask and practice good handwashing.  It was a pleasure to see you and I look forward to continuing to work together on your health and well-being. Please do not hesitate to call the office if you need care or have questions about your care.  Have a wonderful day and week. With Gratitude, Tereasa Coop, DNP, AGNP-BC

## 2019-06-12 ENCOUNTER — Other Ambulatory Visit: Payer: Self-pay | Admitting: *Deleted

## 2019-06-12 DIAGNOSIS — F410 Panic disorder [episodic paroxysmal anxiety] without agoraphobia: Secondary | ICD-10-CM

## 2019-06-12 DIAGNOSIS — F419 Anxiety disorder, unspecified: Secondary | ICD-10-CM

## 2019-06-12 MED ORDER — BUSPIRONE HCL 10 MG PO TABS: 10.0000 mg | ORAL_TABLET | Freq: Three times a day (TID) | ORAL | 1 refills | Status: DC

## 2019-06-27 ENCOUNTER — Other Ambulatory Visit: Payer: Self-pay

## 2019-06-27 ENCOUNTER — Telehealth: Payer: Self-pay

## 2019-06-27 MED ORDER — DULOXETINE HCL 60 MG PO CPEP: 60.0000 mg | ORAL_CAPSULE | Freq: Every day | ORAL | 0 refills | Status: DC

## 2019-06-27 NOTE — Telephone Encounter (Signed)
Refill sent.

## 2019-06-27 NOTE — Telephone Encounter (Signed)
DULoxetine (CYMBALTA) 60 MG capsule, Please send in to the drug store he is out today

## 2019-07-05 ENCOUNTER — Ambulatory Visit: Payer: Medicare HMO | Admitting: Family Medicine

## 2019-07-11 ENCOUNTER — Other Ambulatory Visit: Payer: Self-pay | Admitting: *Deleted

## 2019-07-11 MED ORDER — TRESIBA FLEXTOUCH 200 UNIT/ML ~~LOC~~ SOPN: 50.0000 [IU] | PEN_INJECTOR | Freq: Every day | SUBCUTANEOUS | 1 refills | Status: DC

## 2019-07-17 ENCOUNTER — Other Ambulatory Visit: Payer: Self-pay | Admitting: Family Medicine

## 2019-07-17 DIAGNOSIS — E119 Type 2 diabetes mellitus without complications: Secondary | ICD-10-CM

## 2019-08-02 ENCOUNTER — Other Ambulatory Visit: Payer: Self-pay | Admitting: *Deleted

## 2019-08-02 DIAGNOSIS — F419 Anxiety disorder, unspecified: Secondary | ICD-10-CM

## 2019-08-02 DIAGNOSIS — F410 Panic disorder [episodic paroxysmal anxiety] without agoraphobia: Secondary | ICD-10-CM

## 2019-08-02 MED ORDER — BUSPIRONE HCL 10 MG PO TABS: 10.0000 mg | ORAL_TABLET | Freq: Three times a day (TID) | ORAL | 1 refills | Status: DC

## 2019-08-08 ENCOUNTER — Other Ambulatory Visit: Payer: Self-pay

## 2019-08-08 MED ORDER — TRESIBA FLEXTOUCH 200 UNIT/ML ~~LOC~~ SOPN: 50.0000 [IU] | PEN_INJECTOR | Freq: Every day | SUBCUTANEOUS | 1 refills | Status: DC

## 2019-08-22 ENCOUNTER — Other Ambulatory Visit: Payer: Self-pay | Admitting: *Deleted

## 2019-08-22 MED ORDER — LEVOTHYROXINE SODIUM 125 MCG PO TABS: ORAL_TABLET | ORAL | 0 refills | Status: DC

## 2019-08-24 ENCOUNTER — Telehealth (INDEPENDENT_AMBULATORY_CARE_PROVIDER_SITE_OTHER): Payer: Medicare HMO | Admitting: Family Medicine

## 2019-08-24 ENCOUNTER — Telehealth: Payer: Self-pay | Admitting: Family Medicine

## 2019-08-24 ENCOUNTER — Encounter: Payer: Self-pay | Admitting: Family Medicine

## 2019-08-24 ENCOUNTER — Other Ambulatory Visit: Payer: Self-pay

## 2019-08-24 VITALS — BP 118/80 | Ht 67.0 in | Wt 248.0 lb

## 2019-08-24 DIAGNOSIS — G4733 Obstructive sleep apnea (adult) (pediatric): Secondary | ICD-10-CM

## 2019-08-24 DIAGNOSIS — W57XXXA Bitten or stung by nonvenomous insect and other nonvenomous arthropods, initial encounter: Secondary | ICD-10-CM | POA: Diagnosis not present

## 2019-08-24 DIAGNOSIS — Z9989 Dependence on other enabling machines and devices: Secondary | ICD-10-CM

## 2019-08-24 MED ORDER — DOXYCYCLINE HYCLATE 100 MG PO TABS: 100.0000 mg | ORAL_TABLET | Freq: Two times a day (BID) | ORAL | 0 refills | Status: AC

## 2019-08-24 NOTE — Patient Instructions (Addendum)
I appreciate the opportunity to provide you with care for your health and wellness. Today we discussed: tick bite   Follow up: 11/22/2019 as scheduled   No labs  Referrals today for up dated sleep study needs  Please take medication as directed. Go to the nearest ED if symptoms get worse.   Please continue to practice social distancing to keep you, your family, and our community safe.  If you must go out, please wear a mask and practice good handwashing.  It was a pleasure to see you and I look forward to continuing to work together on your health and well-being. Please do not hesitate to call the office if you need care or have questions about your care.  Have a wonderful day and week. With Gratitude, Tereasa Coop, DNP, AGNP-BC

## 2019-08-24 NOTE — Progress Notes (Signed)
Virtual Visit via Telephone Note   This visit type was conducted due to national recommendations for restrictions regarding the COVID-19 Pandemic (e.g. social distancing) in an effort to limit this patient's exposure and mitigate transmission in our community.  Due to his co-morbid illnesses, this patient is at least at moderate risk for complications without adequate follow up.  This format is felt to be most appropriate for this patient at this time.  The patient did not have access to video technology/had technical difficulties with video requiring transitioning to audio format only (telephone).  All issues noted in this document were discussed and addressed.  No physical exam could be performed with this format.    Evaluation Performed:  Follow-up visit  Date:  08/24/2019   ID:  Thomas Rollins, DOB 16-Jul-1960, MRN [id number]  Patient Location: Home Provider Location: Office  Location of Patient: Home Location of Provider: Telehealth Consent was obtain for visit to be over via telehealth. I verified that I am speaking with the correct person using two identifiers.  PCP:  Freddy Finner, NP   Chief Complaint:  Fatigue post tick bite  History of Present Illness:    Thomas Rollins is a 59 y.o. male with history of several days post tick bite. He reports he pulled off 3 ticks in the last few days and one bite was about a week ago. Reports seeing them weekly on him. He is familiar [place] Some chills and low grade fever. Mild aches and fatigue. Denies chest pain, shortness of breath, N/V or GI symptoms. Reports some redness around the sites.   The patient does not have symptoms concerning for COVID-19 infection (fever, chills, cough, or new shortness of breath).   Past Medical, Surgical, Social History, Allergies, and Medications have been Reviewed.  Past Medical History:  Diagnosis Date  . Anxiety    panic attacks  . Chronic pain   .  Depression   . Diabetes mellitus without complication (HCC)   . Hyperlipidemia   . Hypertension    Past Surgical History:  Procedure Laterality Date  . ankle sx    . ARTHROSCOPIC REPAIR ACL    . CARPAL TUNNEL RELEASE    . HERNIA REPAIR    . SHOULDER ARTHROSCOPY       Current Meds  Medication Sig  . busPIRone (BUSPAR) 10 MG tablet Take 1 tablet (10 mg total) by mouth 3 (three) times daily.  . DULoxetine (CYMBALTA) 60 MG capsule Take 1 capsule (60 mg total) by mouth daily.  [name] FARXIGA 10 MG TABS tablet TAKE 1 TABLET BY MOUTH EVERY DAY  . fluticasone (FLONASE) 50 MCG/ACT nasal spray USE ONE SPRAY in each nostril ONCE EVERY DAY  . glipiZIDE (GLUCOTROL XL) 10 MG 24 hr tablet Take 10 mg by mouth daily with breakfast.  . GLOBAL EASE INJECT PEN NEEDLES 31G X 8 MM MISC 1 each by Other route as directed.  . Lancets (ONETOUCH DELICA PLUS LANCET30G) MISC 1 each by Other route 3 (three) times daily.  [name] levothyroxine (SYNTHROID) 125 MCG tablet TAKE 1 TABLET BY MOUTH IN THE MORNING ON AN EMPTY STOMACH ONCE DAILY  . metFORMIN (GLUCOPHAGE) 1000 MG tablet TAKE 1 TABLET BY MOUTH TWICE DAILY FOR 30 DAYS  . morphine (MS CONTIN) 30 MG 12 hr tablet Take 1 tablet by mouth 2 (two) times daily. Take with the 60mg    . morphine (MS CONTIN) 60 MG 12  hr tablet Take 1 tablet by mouth 2 (two) times daily. Takes with the 30mg    . ONETOUCH VERIO test strip 1 each by Other route 3 (three) times daily.  . simvastatin (ZOCOR) 40 MG tablet TAKE 1 TABLET BY MOUTH IN THE EVENING ONCE A DAILY EMERGENCY REFILL FAXED DR  . FLEXTOUCH 200 UNIT/ML FlexTouch Pen Inject 50-52 Units into the skin at bedtime.     Allergies:   Codeine, Other, and Penicillins   ROS:   Please see the history of present illness.    All other systems reviewed and are negative.   Labs/Other Tests and Data Reviewed:    Recent Labs: 04/06/2019: ALT 53; BUN 19; Creatinine, Ser 0.81; Hemoglobin 17.3; Platelets 223; Potassium 4.6; Sodium 140    Recent Lipid Panel Lab Results  Component Value Date/Time   CHOL 131 04/06/2019 10:00 AM   TRIG 101 04/06/2019 10:00 AM   HDL 45 04/06/2019 10:00 AM   CHOLHDL 3.7 02/05/2017 11:01 AM   LDLCALC 67 04/06/2019 10:00 AM   LDLCALC 87 02/05/2017 11:01 AM    Wt Readings from Last 3 Encounters:  08/24/19 248 lb (112.5 kg)  05/24/19 248 lb (112.5 kg)  04/05/19 237 lb (107.5 kg)     Objective:    Vital Signs:  BP 118/80   Ht 5\' 7"  (1.702 m)   Wt 248 lb (112.5 kg)   BMI 38.84 kg/m    VITAL SIGNS:  reviewed GEN:  alert and oriented  RESPIRATORY:  no shortness of breath noted in conversation  PSYCH:  normal affect and mood   ASSESSMENT & PLAN:    1. Tick bite, initial encounter  - doxycycline (VIBRA-TABS) 100 MG tablet; Take 1 tablet (100 mg total) by mouth 2 (two) times daily for 10 days.  Dispense: 20 tablet; Refill: 0  2. OSA on CPAP  - Ambulatory referral to Neurology   Time:   Today, I have spent 10 minutes with the patient with telehealth technology discussing the above problems.     Medication Adjustments/Labs and Tests Ordered: Current medicines are reviewed at length with the patient today.  Concerns regarding medicines are outlined above.   Tests Ordered: No orders of the defined types were placed in this encounter.   Medication Changes: No orders of the defined types were placed in this encounter.   Disposition:  Follow up 11/22/2019  Signed, , NP  08/24/2019 10:39 AM     Freddy Finner [facility]

## 2019-08-24 NOTE — Telephone Encounter (Signed)
Pt. Stated that he has bitten by ticks several times and is agitated and not feeling like himself with body aches, is asking for a antibiotic to be sent in.

## 2019-08-24 NOTE — Telephone Encounter (Signed)
Called to schedule patient a phone visit because he is requesting an antibiotic. No answer. Left a vm.

## 2019-09-05 ENCOUNTER — Telehealth: Payer: Self-pay | Admitting: Family Medicine

## 2019-09-05 ENCOUNTER — Other Ambulatory Visit: Payer: Self-pay

## 2019-09-05 MED ORDER — METFORMIN HCL 1000 MG PO TABS: ORAL_TABLET | ORAL | 2 refills | Status: DC

## 2019-09-05 NOTE — Telephone Encounter (Signed)
Patient has a question about a medication he was prescribed during his last visit at [facility]. Of note *Patient has an appt tomorrow 09/06/19 at 4:00 with Tereasa Coop* to discuss his pain.

## 2019-09-05 NOTE — Telephone Encounter (Signed)
Pt just wants to see if Dahlia Client will put him back on antibiotic due to tick bite. Once he came off the antibiotic the soreness came back let him know this will be discussed tomorrow with visit from [facility]

## 2019-09-06 ENCOUNTER — Telehealth (INDEPENDENT_AMBULATORY_CARE_PROVIDER_SITE_OTHER): Payer: Medicare HMO | Admitting: Family Medicine

## 2019-09-06 ENCOUNTER — Telehealth: Payer: Self-pay | Admitting: Family Medicine

## 2019-09-06 ENCOUNTER — Encounter: Payer: Self-pay | Admitting: Family Medicine

## 2019-09-06 ENCOUNTER — Other Ambulatory Visit: Payer: Self-pay

## 2019-09-06 VITALS — BP 118/80 | Ht 67.0 in | Wt 248.0 lb

## 2019-09-06 DIAGNOSIS — M25512 Pain in left shoulder: Secondary | ICD-10-CM | POA: Insufficient documentation

## 2019-09-06 MED ORDER — CYCLOBENZAPRINE HCL 5 MG PO TABS: 5.0000 mg | ORAL_TABLET | Freq: Every day | ORAL | 0 refills | Status: DC

## 2019-09-06 NOTE — Progress Notes (Signed)
Virtual Visit via Telephone Note   This visit type was conducted due to national recommendations for restrictions regarding the COVID-19 Pandemic (e.g. social distancing) in an effort to limit this patient's exposure and mitigate transmission in our community.  Due to his co-morbid illnesses, this patient is at least at moderate risk for complications without adequate follow up.  This format is felt to be most appropriate for this patient at this time.  The patient did not have access to video technology/had technical difficulties with video requiring transitioning to audio format only (telephone).  All issues noted in this document were discussed and addressed.  No physical exam could be performed with this format.    Evaluation Performed:  Follow-up visit  Date:  09/06/2019   ID:  Thomas Rollins, DOB 31-Oct-1960, MRN [id number]  Patient Location: Home Provider Location: Office/Clinic  Location of Patient: Home Location of Provider: Telehealth Consent was obtain for visit to be over via telehealth. I verified that I am speaking with the correct person using two identifiers.  PCP:  Freddy Finner, NP   Chief Complaint: Left shoulder pain  History of Present Illness:    Thomas Rollins is a 59 y.o. male with recent onset of left shoulder pain over the last week or so.  He thought it was related to being bit by tick possible Lyme disease.  But he feels like it is more in the muscle and it is in the joint.  He reports that he has difficulty lifting it and rotating it laying on it and holding onto the handlebars of his motorcycle.  He is on morphine reports that this is not helping with any of the discomfort.  He is unsure what he is done to it he is Concerned about possible tear.  He was loading firewood into the back of his truck and he thinks he must of just swung it or tore something or did something that would have caused the injury.  The patient does not have symptoms concerning for  COVID-19 infection (fever, chills, cough, or new shortness of breath).   Past Medical, Surgical, Social History, Allergies, and Medications have been Reviewed.  Past Medical History:  Diagnosis Date  . Anxiety    panic attacks  . Chronic pain   . Depression   . Diabetes mellitus without complication (HCC)   . Hyperlipidemia   . Hypertension    Past Surgical History:  Procedure Laterality Date  . ankle sx    . ARTHROSCOPIC REPAIR ACL    . CARPAL TUNNEL RELEASE    . HERNIA REPAIR    . SHOULDER ARTHROSCOPY       Current Meds  Medication Sig  . busPIRone (BUSPAR) 10 MG tablet Take 1 tablet (10 mg total) by mouth 3 (three) times daily.  . DULoxetine (CYMBALTA) 60 MG capsule Take 1 capsule (60 mg total) by mouth daily.  [name] FARXIGA 10 MG TABS tablet TAKE 1 TABLET BY MOUTH EVERY DAY  . fluticasone (FLONASE) 50 MCG/ACT nasal spray USE ONE SPRAY in each nostril ONCE EVERY DAY  . glipiZIDE (GLUCOTROL XL) 10 MG 24 hr tablet Take 10 mg by mouth daily with breakfast.  . GLOBAL EASE INJECT PEN NEEDLES 31G X 8 MM MISC 1 each by Other route as directed.  . Lancets (ONETOUCH DELICA PLUS LANCET30G) MISC 1 each by Other route 3 (three) times daily.  [name] levothyroxine (SYNTHROID) 125 MCG tablet TAKE 1 TABLET BY MOUTH IN THE MORNING ON AN  EMPTY STOMACH ONCE DAILY  . metFORMIN (GLUCOPHAGE) 1000 MG tablet TAKE 1 TABLET BY MOUTH TWICE DAILY FOR 30 DAYS  . morphine (MS CONTIN) 30 MG 12 hr tablet Take 1 tablet by mouth 2 (two) times daily. Take with the 60mg    . morphine (MS CONTIN) 60 MG 12 hr tablet Take 1 tablet by mouth 2 (two) times daily. Takes with the 30mg    . ONETOUCH VERIO test strip 1 each by Other route 3 (three) times daily.  . simvastatin (ZOCOR) 40 MG tablet TAKE 1 TABLET BY MOUTH IN THE EVENING ONCE A DAILY EMERGENCY REFILL FAXED DR  . FLEXTOUCH 200 UNIT/ML FlexTouch Pen Inject 50-52 Units into the skin at bedtime.     Allergies:   Codeine, Other, and Penicillins   ROS:   Please  see the history of present illness.    All other systems reviewed and are negative.   Labs/Other Tests and Data Reviewed:    Recent Labs: 04/06/2019: ALT 53; BUN 19; Creatinine, Ser 0.81; Hemoglobin 17.3; Platelets 223; Potassium 4.6; Sodium 140   Recent Lipid Panel Lab Results  Component Value Date/Time   CHOL 131 04/06/2019 10:00 AM   TRIG 101 04/06/2019 10:00 AM   HDL 45 04/06/2019 10:00 AM   CHOLHDL 3.7 02/05/2017 11:01 AM   LDLCALC 67 04/06/2019 10:00 AM   LDLCALC 87 02/05/2017 11:01 AM    Wt Readings from Last 3 Encounters:  09/06/19 248 lb (112.5 kg)  08/24/19 248 lb (112.5 kg)  05/24/19 248 lb (112.5 kg)     Objective:    Vital Signs:  BP 118/80   Ht 5\' 7"  (1.702 m)   Wt 248 lb (112.5 kg)   BMI 38.84 kg/m    VITAL SIGNS:  reviewed GEN:  alert and oriented  RESPIRATORY:  no shortness of breath in conversation  PSYCH:  normal affect and mood   ASSESSMENT & PLAN:    1. Acute pain of left shoulder  - Ambulatory referral to Orthopedic Surgery - cyclobenzaprine (FLEXERIL) 5 MG tablet; Take 1 tablet (5 mg total) by mouth at bedtime.  Dispense: 15 tablet; Refill: 0   Time:   Today, I have spent 10 minutes with the patient with telehealth technology discussing the above problems.     Medication Adjustments/Labs and Tests Ordered: Current medicines are reviewed at length with the patient today.  Concerns regarding medicines are outlined above.   Tests Ordered: No orders of the defined types were placed in this encounter.   Medication Changes: No orders of the defined types were placed in this encounter.   Disposition:  Follow up 11/22/2019 Signed, 05/26/19, NP  09/06/2019 3:27 PM     11/24/2019 [facility]

## 2019-09-06 NOTE — Assessment & Plan Note (Signed)
Left shoulder pain, flexeril at night  Referral to Ortho incase of tear.

## 2019-09-06 NOTE — Telephone Encounter (Signed)
Pt advised to go to urgent care to treat this said he would go tomorrow.

## 2019-09-06 NOTE — Patient Instructions (Signed)
I appreciate the opportunity to provide you with care for your health and wellness. Today we discussed: Left shoulder pain  Follow up: As scheduled  No labs  Referrals today: Ortho  Please use the muscle relaxant as needed for bedtime.  If worsening please go to your nearest emergency room or urgent care hopefully you can get into the orthopedic office within the next week.  RICE:  Rest, Ice, Compression if needed, and elevation of arm if needed.  Please continue to practice social distancing to keep you, your family, and our community safe.  If you must go out, please wear a mask and practice good handwashing.  It was a pleasure to see you and I look forward to continuing to work together on your health and well-being. Please do not hesitate to call the office if you need care or have questions about your care.  Have a wonderful day and week. With Gratitude, Tereasa Coop, DNP, AGNP-BC

## 2019-09-06 NOTE — Telephone Encounter (Signed)
Pt stated that his finger tips are numb and tingling was told to contact Lorelle Formosa if it got worse (this happened after his phone visit on 09/06/19).

## 2019-09-07 ENCOUNTER — Ambulatory Visit
Admission: EM | Admit: 2019-09-07 | Discharge: 2019-09-07 | Disposition: A | Discharge status: Home / Self Care | Payer: Medicare HMO | Source: Home / Self Care

## 2019-09-07 ENCOUNTER — Emergency Department (HOSPITAL_COMMUNITY)
Admission: EM | Admit: 2019-09-07 | Discharge: 2019-09-07 | Disposition: A | Discharge status: Home / Self Care | Payer: Medicare HMO | Attending: Emergency Medicine | Admitting: Emergency Medicine

## 2019-09-07 ENCOUNTER — Emergency Department (HOSPITAL_COMMUNITY): Payer: Medicare HMO

## 2019-09-07 ENCOUNTER — Telehealth: Payer: Self-pay | Admitting: Family Medicine

## 2019-09-07 ENCOUNTER — Other Ambulatory Visit: Payer: Self-pay

## 2019-09-07 ENCOUNTER — Encounter (HOSPITAL_COMMUNITY): Payer: Self-pay | Admitting: *Deleted

## 2019-09-07 DIAGNOSIS — I10 Essential (primary) hypertension: Secondary | ICD-10-CM | POA: Diagnosis not present

## 2019-09-07 DIAGNOSIS — X500XXA Overexertion from strenuous movement or load, initial encounter: Secondary | ICD-10-CM | POA: Diagnosis not present

## 2019-09-07 DIAGNOSIS — Y999 Unspecified external cause status: Secondary | ICD-10-CM | POA: Insufficient documentation

## 2019-09-07 DIAGNOSIS — S46912A Strain of unspecified muscle, fascia and tendon at shoulder and upper arm level, left arm, initial encounter: Secondary | ICD-10-CM

## 2019-09-07 DIAGNOSIS — M5412 Radiculopathy, cervical region: Secondary | ICD-10-CM

## 2019-09-07 DIAGNOSIS — E119 Type 2 diabetes mellitus without complications: Secondary | ICD-10-CM | POA: Insufficient documentation

## 2019-09-07 DIAGNOSIS — S46012A Strain of muscle(s) and tendon(s) of the rotator cuff of left shoulder, initial encounter: Secondary | ICD-10-CM | POA: Insufficient documentation

## 2019-09-07 DIAGNOSIS — Y929 Unspecified place or not applicable: Secondary | ICD-10-CM | POA: Insufficient documentation

## 2019-09-07 DIAGNOSIS — M25512 Pain in left shoulder: Secondary | ICD-10-CM

## 2019-09-07 DIAGNOSIS — Y939 Activity, unspecified: Secondary | ICD-10-CM | POA: Insufficient documentation

## 2019-09-07 DIAGNOSIS — F1590 Other stimulant use, unspecified, uncomplicated: Secondary | ICD-10-CM | POA: Insufficient documentation

## 2019-09-07 MED ORDER — LORAZEPAM 2 MG/ML IJ SOLN
1.0000 mg | Freq: Once | INTRAMUSCULAR | Status: AC
  Administered 2019-09-07: 1 mg via INTRAVENOUS
  Filled 2019-09-07: qty 1

## 2019-09-07 MED ORDER — LIDOCAINE 5 % EX PTCH: 1.0000 | MEDICATED_PATCH | CUTANEOUS | 0 refills | Status: DC

## 2019-09-07 MED ORDER — LIDOCAINE 5 % EX PTCH
1.0000 | MEDICATED_PATCH | CUTANEOUS | Status: DC
  Administered 2019-09-07: 1 via TRANSDERMAL
  Filled 2019-09-07: qty 1

## 2019-09-07 MED ORDER — CYCLOBENZAPRINE HCL 10 MG PO TABS
10.0000 mg | ORAL_TABLET | Freq: Two times a day (BID) | ORAL | 0 refills | Status: DC | PRN
Start: 2019-09-07 — End: 2019-10-16

## 2019-09-07 NOTE — ED Notes (Signed)
Pt with left shoulder pain since lifting wood on Tuesday.  Pt states numbness to fingers of left hand since yesterday at finger tips

## 2019-09-07 NOTE — ED Triage Notes (Signed)
LT shoulder pain and numbness in fingertips that started a few days ago.  Pt had meds called in by pcp but they are not helping.

## 2019-09-07 NOTE — Discharge Instructions (Addendum)
You do have a "pinched nerve" in your cervical spine which can explain the tingling sensation in your hand and fingers and may need followup care with a neurologist.  I have referred you to Dr. Franky Macho of neurosurgery. In the interim, get rechecked if you develop weakness in the arm or hand which is potentially a surgical emergency.  You may try arthritis strength tylenol in addition to your current pain medicines.  Increase your flexeril to 2 tablets for 10 mg for each dose, then start the new prescription.  Also call Dr Romeo Apple for further evaluation of your shoulder pain.  The patch provided and prescribed may also help with pain relief.

## 2019-09-07 NOTE — ED Notes (Signed)
Patient transported to X-ray 

## 2019-09-07 NOTE — Telephone Encounter (Signed)
Pt wants to be referred to and office for sleep study. Absolutely doesn't want to go to [facility]. And wanted Dahlia Client to know that he took 2 of the muscle relaxer on 7/14

## 2019-09-07 NOTE — ED Notes (Signed)
Patient is being discharged from the Urgent Care and sent to the Emergency Department via pov . Per B. Wurst, patient is in need of higher level of care due to arm pain. Patient is aware and verbalizes understanding of plan of care. There were no vitals filed for this visit.

## 2019-09-07 NOTE — ED Provider Notes (Signed)
[facility] EMERGENCY DEPARTMENT Provider Note   CSN: [id number] Arrival date & time: 09/07/19  1225     History Chief Complaint  Patient presents with  . Shoulder Pain    Ruth Hilburn is a 59 y.o. male with a history of DM, HTN, hyperlipidemia anxiety and chronic pain under the care of pain management with Dr. Claudette Laws in [facility] presenting with left shoulder pain which started about a week after lifting and throwing heavy oak wood into his pickup truck.  He reports prior history of left arthroscopic surgery of the shoulder years ago but was diagnosed with "muscle strain". He was seen by his pcp yesterday and was prescribed flexeril and is anticipating orthopedic referral. He is concerned about possible rotator tear but since the visit yesterday has also developed numbness and a tingling sensation first in his finger tips, primarily his index, long and right fingers, lesser involvement in the 5th and thumb.  Today the tingling is also in the palm of this hand.  He denies weakness in the hand or fingers. He denies neck pain.  He has not obtained any relief with the flexeril tablets. He also takes diclofenac and MS contin chronically.  HPI     Past Medical History:  Diagnosis Date  . Anxiety    panic attacks  . Chronic pain   . Depression   . Diabetes mellitus without complication (HCC)   . Hyperlipidemia   . Hypertension     Patient Active Problem List   Diagnosis Date Noted  . Acute pain of left shoulder 09/06/2019  . Tick bite 08/24/2019  . Anxiety 04/12/2019  . Panic attacks 04/12/2019  . Vitamin D deficiency 04/11/2019  . Hypothyroidism 04/11/2019  . Encounter for HIV (human immunodeficiency virus) test 04/11/2019  . Exposure to herpes simplex virus (HSV) 04/11/2019  . Encounter for screening for malignant neoplasm of colon 04/05/2019  . Encounter for screening for malignant neoplasm of prostate 04/05/2019  . Encounter for hepatitis C screening test for low  risk patient 04/05/2019  . Diabetes mellitus type 2, insulin dependent (HCC) 02/05/2017  . Depression, recurrent (HCC) 02/05/2017  . Hyperlipidemia associated with type 2 diabetes mellitus (HCC) 02/05/2017  . HTN, goal below 130/80 02/05/2017    Past Surgical History:  Procedure Laterality Date  . ankle sx    . ARTHROSCOPIC REPAIR ACL    . CARPAL TUNNEL RELEASE    . HERNIA REPAIR    . SHOULDER ARTHROSCOPY         Family History  Problem Relation Age of Onset  . Stomach cancer Mother   . Heart Problems Father   . Dementia Father   . Cancer Father   . Bone cancer Brother   . Lung cancer Brother   . Cancer Daughter     Social History   Tobacco Use  . Smoking status: Never Smoker  . Smokeless tobacco: Never Used  Vaping Use  . Vaping Use: Never used  Substance Use Topics  . Alcohol use: Yes    Comment: occ  . Drug use: Yes    Types: Marijuana    Comment: 1-2 times a year     Home Medications Prior to Admission medications   Medication Sig Start Date End Date Taking? Authorizing Provider  ALPHAGAN P 0.1 % SOLN Apply 1 drop to eye 2 (two) times daily. 08/18/19  Yes [provider]  busPIRone (BUSPAR) 10 MG tablet Take 1 tablet (10 mg total) by mouth 3 (three) times  daily. Patient taking differently: Take 10 mg by mouth 2 (two) times daily.  08/02/19  Yes Freddy FinnerMills, Hannah M, NP  cyclobenzaprine (FLEXERIL) 5 MG tablet Take 1 tablet (5 mg total) by mouth at bedtime. 09/06/19  Yes Freddy FinnerMills, Hannah M, NP  diclofenac (CATAFLAM) 50 MG tablet Take 50 mg by mouth 2 (two) times daily. 08/31/19  Yes [provider]  DULoxetine (CYMBALTA) 60 MG capsule Take 1 capsule (60 mg total) by mouth daily. 06/27/19  Yes Freddy FinnerMills, Hannah M, NP  FARXIGA 10 MG TABS tablet TAKE 1 TABLET BY MOUTH EVERY DAY 07/17/19  Yes Freddy FinnerMills, Hannah M, NP  glipiZIDE (GLUCOTROL XL) 10 MG 24 hr tablet Take 10 mg by mouth daily with breakfast.   Yes [provider]  levothyroxine (SYNTHROID) 125 MCG  tablet TAKE 1 TABLET BY MOUTH IN THE MORNING ON AN EMPTY STOMACH ONCE DAILY 08/22/19  Yes Freddy FinnerMills, Hannah M, NP  metFORMIN (GLUCOPHAGE) 1000 MG tablet TAKE 1 TABLET BY MOUTH TWICE DAILY FOR 30 DAYS 09/05/19  Yes Kerri PerchesSimpson, Margaret E, MD  morphine (MS CONTIN) 30 MG 12 hr tablet Take 1 tablet by mouth 2 (two) times daily. Take with the 60mg   07/01/16  Yes [provider]  morphine (MS CONTIN) 60 MG 12 hr tablet Take 1 tablet by mouth 2 (two) times daily. Takes with the 30mg   07/01/16  Yes [provider]  Omega-3 Fatty Acids (FISH OIL PO) Take 1 capsule by mouth daily.   Yes [provider]  simvastatin (ZOCOR) 40 MG tablet TAKE 1 TABLET BY MOUTH IN THE EVENING ONCE A DAILY EMERGENCY REFILL FAXED DR 06/21/16  Yes [provider]  TRESIBA FLEXTOUCH 200 UNIT/ML FlexTouch Pen Inject 50-52 Units into the skin at bedtime. Patient taking differently: Inject 52-56 Units into the skin at bedtime.  08/08/19  Yes Freddy FinnerMills, Hannah M, NP  cyclobenzaprine (FLEXERIL) 10 MG tablet Take 1 tablet (10 mg total) by mouth 2 (two) times daily as needed for muscle spasms. 09/07/19   Burgess AmorIdol, Bindi Klomp, PA-C  fluticasone (FLONASE) 50 MCG/ACT nasal spray USE ONE SPRAY in each nostril ONCE EVERY DAY Patient not taking: Reported on 09/07/2019 01/18/17   [provider]  GLOBAL EASE INJECT PEN NEEDLES 31G X 8 MM MISC 1 each by Other route as directed. 02/20/19   [provider]  lidocaine (LIDODERM) 5 % Place 1 patch onto the skin daily. Remove & Discard patch within 12 hours or as directed by MD 09/07/19   Burgess AmorIdol, Wyley Hack, PA-C    Allergies    Codeine, Other, and Penicillins  Review of Systems   Review of Systems  Constitutional: Negative for chills and fever.  Musculoskeletal: Positive for arthralgias. Negative for joint swelling, myalgias, neck pain and neck stiffness.  Neurological: Positive for numbness. Negative for weakness.  All other systems reviewed and are negative.   Physical  Exam Updated Vital Signs BP 140/85   Pulse 70   Temp 98.6 F (37 C)   Resp 16   Ht 5\' 8"  (1.727 m)   Wt 110 kg   SpO2 97%   BMI 36.87 kg/m   Physical Exam Constitutional:      Appearance: He is well-developed.  HENT:     Head: Atraumatic.  Cardiovascular:     Rate and Rhythm: Normal rate.     Comments: Pulses equal bilaterally Pulmonary:     Effort: Pulmonary effort is normal.  Musculoskeletal:        General: Tenderness present.  Left shoulder: Bony tenderness present. No swelling, deformity, effusion or crepitus. Normal range of motion.     Right forearm: Normal.     Left forearm: Normal.     Right wrist: Normal.     Left wrist: Normal.     Left hand: No bony tenderness. Normal range of motion. Normal strength. Decreased sensation of the median distribution. Normal capillary refill. Normal pulse.     Cervical back: Normal range of motion. No edema, erythema or bony tenderness. Pain with movement present.     Comments: Pain with palpation upper left shoulder and along left trapezius. No appreciable muscle spasm.  Decreased sensation to fine and sharp touch mid dorsum volar left hand and index, long and ring fingers. FROM of fingers, including full fist. No strength deficit appreciated. Less than 2 sec cap refill in fingertips. Grips equal and full. Negative tinels.   Mild pain elicited with leftward neck rotation. No radicular increased pain with neck movement.    Skin:    General: Skin is warm and dry.  Neurological:     Mental Status: He is alert.     Sensory: No sensory deficit.     Deep Tendon Reflexes: Reflexes normal.     ED Results / Procedures / Treatments   Labs (all labs ordered are listed, but only abnormal results are displayed) Labs Reviewed - No data to display  EKG None  Radiology MR Cervical Spine Wo Contrast  Result Date: 09/07/2019 CLINICAL DATA:  Left shoulder pain and numbness extending to the fingers. EXAM: MRI CERVICAL SPINE WITHOUT  CONTRAST TECHNIQUE: Multiplanar, multisequence MR imaging of the cervical spine was performed. No intravenous contrast was administered. COMPARISON:  None. FINDINGS: Alignment: Straightening of the normal cervical lordosis. Vertebrae: No fracture or primary bone lesion. Cord: No cord compression or primary cord lesion. Posterior Fossa, vertebral arteries, paraspinal tissues: Negative Disc levels: Foramen magnum is widely patent. No abnormality seen at C1-2 or C2-3. C3-4: Mild uncovertebral prominence.  No stenosis. C4-5: Bilateral uncovertebral hypertrophy. No compressive canal or foraminal narrowing. C5-6: Disc bulge and uncovertebral prominence worse on the left. Mild left foraminal narrowing that could possibly affect the C6 nerve. C6-7: Endplate osteophytes and bulging of the disc. Mild foraminal stenosis on the right. Foramen on the left widely patent. C7-T1: Minimal disc bulge and facet degeneration. No canal or foraminal stenosis. IMPRESSION: Mild cervical spondylosis. No central canal stenosis. No advanced degenerative change. Patient does have some foraminal narrowing on the left at C5-6 that could possibly affect the left C6 nerve. Electronically Signed   By: Paulina Fusi M.D.   On: 09/07/2019 16:21   DG Shoulder Left  Result Date: 09/07/2019 CLINICAL DATA:  Left shoulder pain EXAM: LEFT SHOULDER - 2+ VIEW COMPARISON:  None. FINDINGS: No evidence of acute fracture or dislocation. Mild arthropathy of the glenohumeral and acromioclavicular joint. There is a subtle ovoid mineralized density superior to the greater tuberosity which may reflect hydroxyapatite deposition within the rotator cuff. Soft tissues are otherwise within normal limits. There is a rounded calcification projecting over the left lung field, likely granuloma. IMPRESSION: 1. Mild arthropathy of the left shoulder.  No acute findings. 2. Findings suggestive of rotator cuff calcific tendinopathy. Electronically Signed   By: Duanne Guess  D.O.   On: 09/07/2019 14:12    Procedures Procedures (including critical care time)  Medications Ordered in ED Medications  lidocaine (LIDODERM) 5 % 1 patch (1 patch Transdermal Patch Applied 09/07/19 1745)  LORazepam (ATIVAN) injection 1  mg (1 mg Intravenous Given 09/07/19 1519)    ED Course  I have reviewed the triage vital signs and the nursing notes.  Pertinent labs & imaging results that were available during my care of the patient were reviewed by me and considered in my medical decision making (see chart for details).    MDM Rules/Calculators/A&P                          Imaging reviewed and discussed with pt.  Possible C6 impingement syndrome, although distribution of his numbness not isolated to C6.  Plain films reassuring, although cannot rule out soft tissue injury such as rotator cuff injury. He was given a shoulder sling for comfort. Advised can increase the flexeril to 10 mg. Continue other home meds. Provided lidocaine patch for sx relief.  Referral to neurosurgery for further evaluation of this cervical radiculopathy and MRI findings. Also referred to ortho for shoulder pain management.  Final Clinical Impression(s) / ED Diagnoses Final diagnoses:  Cervical radiculopathy at C6  Strain of left shoulder, initial encounter    Rx / DC Orders ED Discharge Orders         Ordered    lidocaine (LIDODERM) 5 %  Every 24 hours     Discontinue  Reprint     09/07/19 1738    cyclobenzaprine (FLEXERIL) 10 MG tablet  2 times daily PRN     Discontinue  Reprint     09/07/19 1802           Burgess Amor, PA-C 09/07/19 1907    Terrilee Files, MD 09/08/19 [phone]

## 2019-09-07 NOTE — ED Triage Notes (Signed)
Left shoulder pain after lifting wood,states he has progressive numbness in fingers

## 2019-09-07 NOTE — ED Notes (Signed)
Talked to PCP over the phone yesterday and was prescribed muscle relaxer without relief per pt.

## 2019-09-08 ENCOUNTER — Other Ambulatory Visit: Payer: Self-pay

## 2019-09-08 ENCOUNTER — Other Ambulatory Visit: Payer: Self-pay | Admitting: Internal Medicine

## 2019-09-08 MED ORDER — METHOCARBAMOL 500 MG PO TABS: 500.0000 mg | ORAL_TABLET | Freq: Three times a day (TID) | ORAL | 0 refills | Status: DC | PRN

## 2019-09-08 NOTE — Progress Notes (Signed)
error 

## 2019-09-12 DIAGNOSIS — R030 Elevated blood-pressure reading, without diagnosis of hypertension: Secondary | ICD-10-CM | POA: Insufficient documentation

## 2019-09-12 DIAGNOSIS — Z6837 Body mass index (BMI) 37.0-37.9, adult: Secondary | ICD-10-CM | POA: Insufficient documentation

## 2019-09-12 NOTE — Telephone Encounter (Signed)
Please let Dahlia Client know about the pts note.  I will work on changing the referrel

## 2019-09-12 NOTE — Telephone Encounter (Signed)
FYI

## 2019-09-12 NOTE — Telephone Encounter (Signed)
Noted, thank you for follow up. 

## 2019-09-14 ENCOUNTER — Other Ambulatory Visit: Payer: Self-pay | Admitting: *Deleted

## 2019-09-14 MED ORDER — LEVOTHYROXINE SODIUM 125 MCG PO TABS: ORAL_TABLET | ORAL | 0 refills | Status: DC

## 2019-09-15 ENCOUNTER — Other Ambulatory Visit (HOSPITAL_COMMUNITY): Payer: Self-pay | Admitting: Neurosurgery

## 2019-09-15 ENCOUNTER — Other Ambulatory Visit: Payer: Self-pay | Admitting: *Deleted

## 2019-09-15 DIAGNOSIS — F419 Anxiety disorder, unspecified: Secondary | ICD-10-CM

## 2019-09-15 DIAGNOSIS — M25512 Pain in left shoulder: Secondary | ICD-10-CM

## 2019-09-15 DIAGNOSIS — M25511 Pain in right shoulder: Secondary | ICD-10-CM

## 2019-09-15 DIAGNOSIS — F410 Panic disorder [episodic paroxysmal anxiety] without agoraphobia: Secondary | ICD-10-CM

## 2019-09-15 MED ORDER — GLIPIZIDE ER 10 MG PO TB24: 10.0000 mg | ORAL_TABLET | Freq: Every day | ORAL | 0 refills | Status: DC

## 2019-09-15 MED ORDER — SIMVASTATIN 40 MG PO TABS: ORAL_TABLET | ORAL | 3 refills | Status: DC

## 2019-09-15 MED ORDER — BUSPIRONE HCL 10 MG PO TABS: 10.0000 mg | ORAL_TABLET | Freq: Three times a day (TID) | ORAL | 1 refills | Status: DC

## 2019-09-17 ENCOUNTER — Other Ambulatory Visit: Payer: Self-pay

## 2019-09-17 ENCOUNTER — Encounter (HOSPITAL_COMMUNITY): Payer: Self-pay | Admitting: Emergency Medicine

## 2019-09-17 ENCOUNTER — Emergency Department (HOSPITAL_COMMUNITY)
Admission: EM | Admit: 2019-09-17 | Discharge: 2019-09-17 | Disposition: A | Discharge status: Home / Self Care | Payer: Medicare HMO | Attending: Emergency Medicine | Admitting: Emergency Medicine

## 2019-09-17 DIAGNOSIS — M25511 Pain in right shoulder: Secondary | ICD-10-CM | POA: Diagnosis not present

## 2019-09-17 DIAGNOSIS — R200 Anesthesia of skin: Secondary | ICD-10-CM | POA: Insufficient documentation

## 2019-09-17 DIAGNOSIS — E119 Type 2 diabetes mellitus without complications: Secondary | ICD-10-CM | POA: Diagnosis not present

## 2019-09-17 DIAGNOSIS — F1210 Cannabis abuse, uncomplicated: Secondary | ICD-10-CM | POA: Diagnosis not present

## 2019-09-17 DIAGNOSIS — I10 Essential (primary) hypertension: Secondary | ICD-10-CM | POA: Insufficient documentation

## 2019-09-17 DIAGNOSIS — Z7984 Long term (current) use of oral hypoglycemic drugs: Secondary | ICD-10-CM | POA: Insufficient documentation

## 2019-09-17 DIAGNOSIS — R202 Paresthesia of skin: Secondary | ICD-10-CM | POA: Insufficient documentation

## 2019-09-17 DIAGNOSIS — Z79899 Other long term (current) drug therapy: Secondary | ICD-10-CM | POA: Diagnosis not present

## 2019-09-17 MED ORDER — TRIAMCINOLONE ACETONIDE 40 MG/ML IJ SUSP
40.0000 mg | Freq: Once | INTRAMUSCULAR | Status: AC
  Administered 2019-09-17: 40 mg via INTRA_ARTICULAR
  Filled 2019-09-17: qty 1

## 2019-09-17 MED ORDER — GABAPENTIN 300 MG PO CAPS
300.0000 mg | ORAL_CAPSULE | Freq: Three times a day (TID) | ORAL | 0 refills | Status: DC
Start: 2019-09-17 — End: 2019-11-01

## 2019-09-17 MED ORDER — LIDOCAINE HCL (PF) 2 % IJ SOLN
10.0000 mL | Freq: Once | INTRAMUSCULAR | Status: AC
  Administered 2019-09-17: 10 mL

## 2019-09-17 NOTE — ED Notes (Signed)
Kelly in with pt at this time

## 2019-09-17 NOTE — ED Triage Notes (Addendum)
Patient c/o left shoulder pain. Per patient started on 7/15 after throwing wood. Patient reports being seen and having MRI of neck but not shoulder. Per patient pain is increasingly getting worse in which he resorted to buying alcohol. Patient states worse with movement. Patient also reports taking Robaxin 500mg  with no relief.

## 2019-09-17 NOTE — ED Provider Notes (Signed)
[facility] EMERGENCY DEPARTMENT Provider Note   CSN: [id number] Arrival date & time: 09/17/19  1115     History Chief Complaint  Patient presents with  . Shoulder Pain    Thomas Rollins is a 59 y.o. male who presents with left shoulder pain.  Patient states that he was lifting heavy pieces of wood a couple weeks ago and when he did that he had an acute onset of gradually worsening pain in his left side of his neck and left shoulder.  He came to the emergency department on July 15 and due to complaints of numbness and tingling in the left arm MRI of the cervical spine was obtained which showed a possible nerve impingement.  Patient has a history of chronic pain and is prescribed 90 mg of morphine which he takes twice daily.  He states this has not been helpful for his pain.  He was also prescribed Flexeril, Robaxin, lidocaine patches, Diflucan close the neck, and was advised to use a sling.  He has tried all of these without significant relief.  Dates that the pain is so bad that is driving him crazy and he is resorted to drinking alcohol for the pain which also has not been helping.  He was just having numbness and tingling in the middle finger but now is having numbness and tingling in the entire hand and has pain running up and down his whole arm.  He was referred to neurosurgery for the nerve impingement who he did see but was told that symptoms were likely not coming from the neck, arm more likely coming from his shoulder.  He comes today requesting MRI of the shoulder and help with pain control.  HPI     Past Medical History:  Diagnosis Date  . Anxiety    panic attacks  . Chronic pain   . Depression   . Diabetes mellitus without complication (HCC)   . Hyperlipidemia   . Hypertension     Patient Active Problem List   Diagnosis Date Noted  . Acute pain of left shoulder 09/06/2019  . Tick bite 08/24/2019  . Anxiety 04/12/2019  . Panic attacks 04/12/2019  . Vitamin D deficiency  04/11/2019  . Hypothyroidism 04/11/2019  . Encounter for HIV (human immunodeficiency virus) test 04/11/2019  . Exposure to herpes simplex virus (HSV) 04/11/2019  . Encounter for screening for malignant neoplasm of colon 04/05/2019  . Encounter for screening for malignant neoplasm of prostate 04/05/2019  . Encounter for hepatitis C screening test for low risk patient 04/05/2019  . Diabetes mellitus type 2, insulin dependent (HCC) 02/05/2017  . Depression, recurrent (HCC) 02/05/2017  . Hyperlipidemia associated with type 2 diabetes mellitus (HCC) 02/05/2017  . HTN, goal below 130/80 02/05/2017    Past Surgical History:  Procedure Laterality Date  . ankle sx    . ARTHROSCOPIC REPAIR ACL    . CARPAL TUNNEL RELEASE    . HERNIA REPAIR    . SHOULDER ARTHROSCOPY         Family History  Problem Relation Age of Onset  . Stomach cancer Mother   . Heart Problems Father   . Dementia Father   . Cancer Father   . Bone cancer Brother   . Lung cancer Brother   . Cancer Daughter     Social History   Tobacco Use  . Smoking status: Never Smoker  . Smokeless tobacco: Never Used  Vaping Use  . Vaping Use: Never used  Substance Use Topics  .  Alcohol use: Yes    Comment: occ  . Drug use: Yes    Types: Marijuana    Comment: 1-2 times a year     Home Medications Prior to Admission medications   Medication Sig Start Date End Date Taking? Authorizing Provider  ALPHAGAN P 0.1 % SOLN Apply 1 drop to eye 2 (two) times daily. 08/18/19   [provider]  busPIRone (BUSPAR) 10 MG tablet Take 1 tablet (10 mg total) by mouth 3 (three) times daily. 09/15/19   Freddy Finner, NP  cyclobenzaprine (FLEXERIL) 10 MG tablet Take 1 tablet (10 mg total) by mouth 2 (two) times daily as needed for muscle spasms. 09/07/19   Burgess Amor, PA-C  cyclobenzaprine (FLEXERIL) 5 MG tablet Take 1 tablet (5 mg total) by mouth at bedtime. 09/06/19   Freddy Finner, NP  diclofenac (CATAFLAM) 50 MG tablet Take 50  mg by mouth 2 (two) times daily. 08/31/19   [provider]  DULoxetine (CYMBALTA) 60 MG capsule Take 1 capsule (60 mg total) by mouth daily. 06/27/19   Freddy Finner, NP  FARXIGA 10 MG TABS tablet TAKE 1 TABLET BY MOUTH EVERY DAY 07/17/19   Freddy Finner, NP  fluticasone [facility]) 50 MCG/ACT nasal spray USE ONE SPRAY in each nostril ONCE EVERY DAY Patient not taking: Reported on 09/07/2019 01/18/17   [provider]  glipiZIDE (GLUCOTROL XL) 10 MG 24 hr tablet Take 1 tablet (10 mg total) by mouth daily with breakfast. 09/15/19   Freddy Finner, NP  GLOBAL EASE INJECT PEN NEEDLES 31G X 8 MM MISC 1 each by Other route as directed. 02/20/19   [provider]  levothyroxine (SYNTHROID) 125 MCG tablet TAKE 1 TABLET BY MOUTH IN THE MORNING ON AN EMPTY STOMACH ONCE DAILY 09/14/19   Freddy Finner, NP  lidocaine (LIDODERM) 5 % Place 1 patch onto the skin daily. Remove & Discard patch within 12 hours or as directed by MD 09/07/19   Burgess Amor, PA-C  metFORMIN (GLUCOPHAGE) 1000 MG tablet TAKE 1 TABLET BY MOUTH TWICE DAILY FOR 30 DAYS 09/05/19   Kerri Perches, MD  methocarbamol (ROBAXIN) 500 MG tablet Take 1 tablet (500 mg total) by mouth every 8 (eight) hours as needed for muscle spasms. 09/08/19   Pahwani, Kasandra Knudsen, MD  morphine (MS CONTIN) 30 MG 12 hr tablet Take 1 tablet by mouth 2 (two) times daily. Take with the 60mg   07/01/16   [provider]  morphine (MS CONTIN) 60 MG 12 hr tablet Take 1 tablet by mouth 2 (two) times daily. Takes with the 30mg   07/01/16   [provider]  Omega-3 Fatty Acids (FISH OIL PO) Take 1 capsule by mouth daily.    [provider]  simvastatin (ZOCOR) 40 MG tablet TAKE 1 TABLET BY MOUTH IN THE EVENING ONCE A DAILY EMERGENCY REFILL FAXED DR 09/15/19   08/31/16, NP  TRESIBA FLEXTOUCH 200 UNIT/ML FlexTouch Pen Inject 50-52 Units into the skin at bedtime. Patient taking differently: Inject 52-56 Units into the skin at  bedtime.  08/08/19   Freddy Finner, NP    Allergies    Codeine, Other, and Penicillins  Review of Systems   Review of Systems  Musculoskeletal: Positive for arthralgias and myalgias.  Neurological: Positive for numbness. Negative for weakness.    Physical Exam Updated Vital Signs BP (!) 154/98 (BP Location: Right Arm)   Pulse 94   Temp 98.6 F (37 C) (Oral)  Resp 18   Ht 5\' 8"  (1.727 m)   Wt (!) 110.7 kg   SpO2 98%   BMI 37.10 kg/m   Physical Exam Vitals and nursing note reviewed.  Constitutional:      General: He is not in acute distress.    Appearance: He is well-developed.     Comments: Tearful.  Labile mood. NAD  HENT:     Head: Normocephalic and atraumatic.  Eyes:     General: No scleral icterus.       Right eye: No discharge.        Left eye: No discharge.     Conjunctiva/sclera: Conjunctivae normal.     Pupils: Pupils are equal, round, and reactive to light.  Cardiovascular:     Rate and Rhythm: Normal rate and regular rhythm.  Pulmonary:     Effort: Pulmonary effort is normal. No respiratory distress.     Breath sounds: Normal breath sounds.  Abdominal:     General: There is no distension.  Musculoskeletal:     Cervical back: Normal range of motion.     Comments: Left shoulder: Tenderness over the anterior and posterior shoulder. He is able to actively range the shoulder to about 90 degrees. 5/5 strength. N/V intact  Skin:    General: Skin is warm and dry.  Neurological:     Mental Status: He is alert and oriented to person, place, and time.  Psychiatric:        Behavior: Behavior normal.     ED Results / Procedures / Treatments   Labs (all labs ordered are listed, but only abnormal results are displayed) Labs Reviewed - No data to display  EKG None  Radiology No results found.  Procedures Injection of joint  Date/Time: 09/17/2019 6:18 PM Performed by: 09/19/2019, PA-C Authorized by: Bethel Born, PA-C  Consent: Verbal  consent obtained. Risks and benefits: risks, benefits and alternatives were discussed Consent given by: patient Patient understanding: patient states understanding of the procedure being performed Patient consent: the patient's understanding of the procedure matches consent given Procedure consent: procedure consent matches procedure scheduled Relevant documents: relevant documents present and verified Test results: test results available and properly labeled Site marked: the operative site was marked Imaging studies: imaging studies available Required items: required blood products, implants, devices, and special equipment available Patient identity confirmed: verbally with patient Time out: Immediately prior to procedure a "time out" was called to verify the correct patient, procedure, equipment, support staff and site/side marked as required. Preparation: Patient was prepped and draped in the usual sterile fashion. Local anesthesia used: no  Anesthesia: Local anesthesia used: no  Sedation: Patient sedated: no  Patient tolerance: patient tolerated the procedure well with no immediate complications Comments: 58mL of 2% Lidocaine with 44mL of 40mg  of Kenolog was injected in to the shoulder posteriorly    (including critical care time)    Medications Ordered in ED Medications - No data to display  ED Course  I have reviewed the triage vital signs and the nursing notes.  Pertinent labs & imaging results that were available during my care of the patient were reviewed by me and considered in my medical decision making (see chart for details).  59 year old male with history of chronic pain presents with acute shoulder pain over the past couple of weeks which has been gradually worsening and intractable with prescription medications.  He is already had an MRI of his cervical spine which showed a possible nerve impingement  however neurosurgery has evaluated the patient and feels like his  symptoms are coming from the shoulder.  Patient does indicate that his pain starts in the shoulder but he is also having paresthesias of the arm and hand.  He has no additional trauma and does not feel like any imaging such as x-ray would be indicated today.  We do not have MRI available and I do not feel that this is indicated emergently anyways.  Advised the patient that we should focus on pain control today which she is agreeable to.  Local injection into the posterior shoulder was given reported significant relief of pain.  We will also prescribe gabapentin for his paresthesias.  He is scheduled to have an MRI of the shoulder in the next couple of weeks and follow-up with Dr. Romeo AppleHarrison with orthopedics.  The patient verbalized understanding.  MDM Rules/Calculators/A&P                           Final Clinical Impression(s) / ED Diagnoses Final diagnoses:  Acute pain of right shoulder    Rx / DC Orders ED Discharge Orders    None       Bethel BornGekas, Chrysten Woulfe Marie, PA-C 09/17/19 1821    Bethann BerkshireZammit, Joseph, MD 09/18/19 [phone]

## 2019-09-17 NOTE — ED Notes (Signed)
Pharmacy called

## 2019-09-17 NOTE — ED Notes (Signed)
Pt called the RN in room c/o some tight chestness; EKG performed and given to Dr. Particia Nearing; Tresa Endo PA informed of pt's chest pain

## 2019-09-17 NOTE — Discharge Instructions (Signed)
Continue medicine prescribed to you Start Gabapentin 300mg  three times daily for pain Please follow up with Dr. for further evaluation of your shoulder pain

## 2019-09-18 ENCOUNTER — Other Ambulatory Visit: Payer: Self-pay

## 2019-09-18 MED ORDER — DULOXETINE HCL 60 MG PO CPEP: 60.0000 mg | ORAL_CAPSULE | Freq: Every day | ORAL | 0 refills | Status: DC

## 2019-09-24 ENCOUNTER — Encounter: Payer: Self-pay | Admitting: Neurology

## 2019-09-28 ENCOUNTER — Other Ambulatory Visit: Payer: Self-pay

## 2019-09-28 ENCOUNTER — Ambulatory Visit (HOSPITAL_COMMUNITY)
Admission: RE | Admit: 2019-09-28 | Discharge: 2019-09-28 | Disposition: A | Discharge status: Home / Self Care | Payer: Medicare HMO | Source: Ambulatory Visit | Attending: Neurosurgery | Admitting: Neurosurgery

## 2019-09-28 DIAGNOSIS — M25512 Pain in left shoulder: Secondary | ICD-10-CM | POA: Insufficient documentation

## 2019-10-05 ENCOUNTER — Other Ambulatory Visit: Payer: Self-pay | Admitting: *Deleted

## 2019-10-05 MED ORDER — GLIPIZIDE ER 10 MG PO TB24: 10.0000 mg | ORAL_TABLET | Freq: Every day | ORAL | 0 refills | Status: DC

## 2019-10-16 ENCOUNTER — Encounter: Payer: Self-pay | Admitting: Orthopedic Surgery

## 2019-10-16 ENCOUNTER — Other Ambulatory Visit: Payer: Self-pay

## 2019-10-16 ENCOUNTER — Ambulatory Visit (INDEPENDENT_AMBULATORY_CARE_PROVIDER_SITE_OTHER): Payer: Medicare HMO | Admitting: Orthopedic Surgery

## 2019-10-16 VITALS — BP 160/84 | HR 80 | Ht 67.0 in | Wt 243.0 lb

## 2019-10-16 DIAGNOSIS — M778 Other enthesopathies, not elsewhere classified: Secondary | ICD-10-CM

## 2019-10-16 DIAGNOSIS — M4722 Other spondylosis with radiculopathy, cervical region: Secondary | ICD-10-CM

## 2019-10-16 NOTE — Patient Instructions (Signed)
See Dr Ovid Curd as needed

## 2019-10-16 NOTE — Progress Notes (Signed)
NEW PROBLEM//OFFICE VISIT  Chief Complaint  Patient presents with  . Shoulder Pain    left shoulder pain, had MRI, had a fall a while back.     59 year old male presents for evaluation of left shoulder pain.  Patient says he was throwing some wood into a truck on July 10 felt acute shoulder pain radiating to his hand associated with numbness and tingling and radicular symptoms.  He went to the emergency room he was given some medication there and and and an injection he eventually had an MRI of the shoulder and neck.  His neck MRI showed C6 foraminal stenosis and tendinitis of the left shoulder  He is getting good relief with gabapentin and a heating pad  He says that his chronic morphine 60 mg twice a day seems to control his chronic pain but not his shoulder pain     Review of Systems  HENT: Positive for hearing loss.   Musculoskeletal: Positive for back pain, joint pain and neck pain.  Psychiatric/Behavioral: Positive for depression.     Past Medical History:  Diagnosis Date  . Anxiety    panic attacks  . Chronic pain   . Depression   . Diabetes mellitus without complication (HCC)   . Hyperlipidemia   . Hypertension     Past Surgical History:  Procedure Laterality Date  . ankle sx    . ARTHROSCOPIC REPAIR ACL    . CARPAL TUNNEL RELEASE    . HERNIA REPAIR    . SHOULDER ARTHROSCOPY      Family History  Problem Relation Age of Onset  . Stomach cancer Mother   . Heart Problems Father   . Dementia Father   . Cancer Father   . Bone cancer Brother   . Lung cancer Brother   . Cancer Daughter    Social History   Tobacco Use  . Smoking status: Never Smoker  . Smokeless tobacco: Never Used  Vaping Use  . Vaping Use: Never used  Substance Use Topics  . Alcohol use: Yes    Comment: occ  . Drug use: Yes    Types: Marijuana    Comment: 1-2 times a year     Allergies  Allergen Reactions  . Codeine Other (See Comments)    Slurred Speech   . Other      [place], sweat potatoes,   . Penicillins Rash    Current Meds  Medication Sig  . ALPHAGAN P 0.1 % SOLN Apply 1 drop to eye 2 (two) times daily.  . busPIRone (BUSPAR) 10 MG tablet Take 1 tablet (10 mg total) by mouth 3 (three) times daily.  . DULoxetine (CYMBALTA) 60 MG capsule Take 1 capsule (60 mg total) by mouth daily.  [name] FARXIGA 10 MG TABS tablet TAKE 1 TABLET BY MOUTH EVERY DAY  . fluticasone (FLONASE) 50 MCG/ACT nasal spray USE ONE SPRAY in each nostril ONCE EVERY DAY  . gabapentin (NEURONTIN) 300 MG capsule Take 1 capsule (300 mg total) by mouth 3 (three) times daily. (Patient taking differently: Take 300 mg by mouth daily. )  . glipiZIDE (GLUCOTROL XL) 10 MG 24 hr tablet Take 1 tablet (10 mg total) by mouth daily with breakfast.  . GLOBAL EASE INJECT PEN NEEDLES 31G X 8 MM MISC 1 each by Other route as directed.  [name] levothyroxine (SYNTHROID) 125 MCG tablet TAKE 1 TABLET BY MOUTH IN THE MORNING ON AN EMPTY STOMACH ONCE DAILY  . lidocaine (LIDODERM) 5 % Place 1 patch onto  the skin daily. Remove & Discard patch within 12 hours or as directed by MD  . metFORMIN (GLUCOPHAGE) 1000 MG tablet TAKE 1 TABLET BY MOUTH TWICE DAILY FOR 30 DAYS  . morphine (MS CONTIN) 30 MG 12 hr tablet Take 1 tablet by mouth 2 (two) times daily. Take with the 60mg    . morphine (MS CONTIN) 60 MG 12 hr tablet Take 1 tablet by mouth 2 (two) times daily. Takes with the 30mg    . Omega-3 Fatty Acids (FISH OIL PO) Take 1 capsule by mouth daily.  . simvastatin (ZOCOR) 40 MG tablet TAKE 1 TABLET BY MOUTH IN THE EVENING ONCE A DAILY EMERGENCY REFILL FAXED DR  . FLEXTOUCH 200 UNIT/ML FlexTouch Pen Inject 50-52 Units into the skin at bedtime. (Patient taking differently: Inject 52-56 Units into the skin at bedtime. )    BP (!) 160/84   Pulse 80   Ht 5\' 7"  (1.702 m)   Wt 243 lb (110.2 kg)   BMI 38.06 kg/m   Physical Exam Constitutional:      General: He is not in acute distress.    Appearance: He is  well-developed.  Cardiovascular:     Comments: No peripheral edema Skin:    General: Skin is warm and dry.  Neurological:     Mental Status: He is alert and oriented to person, place, and time.     Sensory: No sensory deficit.     Coordination: Coordination normal.     Gait: Gait normal.     Deep Tendon Reflexes: Reflexes are normal and symmetric.     Ortho Exam  Left shoulder exam patient has full forward elevation pain related mild weakness no evidence of rotator cuff tear no impingement no instability minimal if any tenderness in the posterior subacromial space pain is more in the trapezius and medial scapula    MEDICAL DECISION MAKING  A.  Encounter Diagnoses  Name Primary?  . Cervical spondylosis with radiculopathy Yes  . Tendinitis of left shoulder     B. DATA ANALYSED:   IMAGING: Interpretation of images: MRI shoulder no evidence of cuff tear mild tendinosis in the rotator cuff  MRI cervical spine C6 foraminal stenosis multilevel degenerative disc disease  Orders: None  Outside records reviewed: ER records   C. MANAGEMENT   I went over the MRI shoulder and neck with the patient as well as models of the shoulder neck and dermatomal images  Seems to understand that his symptoms are radicular in nature  He will continue with gabapentin and follow-up with neurosurgery if needed  Shoulder mri IMPRESSION: Extremely limited examination secondary to patient motion related to patient discomfort degrading image quality limiting evaluation. The entirety of the examination was not completed which also limits the evaluation secondary to patient discomfort.   1. Mild tendinosis of the supraspinatus and infraspinatus tendons. 2. Moderate tendinosis of the intra-articular portion of the long head of the biceps tendon.     Electronically Signed   By:   On: 09/29/2019 08:59   C SPINE  IMPRESSION: Mild cervical spondylosis. No central canal  stenosis. No advanced degenerative change. Patient does have some foraminal narrowing on the left at C5-6 that could possibly affect the left C6 nerve.     Electronically Signed   By: M.D.   On: 09/07/2019 16:21   No orders of the defined types were placed in this encounter.  [facility] COMPLICATED WITH OUTSIDE IMAGES READ   Paulina Fusi,  MD  10/16/2019 10:26 AM

## 2019-10-18 ENCOUNTER — Other Ambulatory Visit: Payer: Self-pay

## 2019-10-18 MED ORDER — LEVOTHYROXINE SODIUM 125 MCG PO TABS: ORAL_TABLET | ORAL | 2 refills | Status: DC

## 2019-10-23 ENCOUNTER — Encounter: Payer: Self-pay | Admitting: Neurology

## 2019-10-23 ENCOUNTER — Other Ambulatory Visit: Payer: Self-pay | Admitting: Neurology

## 2019-10-23 ENCOUNTER — Ambulatory Visit: Payer: Medicare HMO | Admitting: Neurology

## 2019-10-23 ENCOUNTER — Other Ambulatory Visit: Payer: Self-pay

## 2019-10-23 VITALS — BP 143/86 | HR 91 | Ht 67.0 in | Wt 245.0 lb

## 2019-10-23 DIAGNOSIS — Z9989 Dependence on other enabling machines and devices: Secondary | ICD-10-CM

## 2019-10-23 DIAGNOSIS — G4709 Other insomnia: Secondary | ICD-10-CM | POA: Insufficient documentation

## 2019-10-23 DIAGNOSIS — Z79891 Long term (current) use of opiate analgesic: Secondary | ICD-10-CM

## 2019-10-23 DIAGNOSIS — K08109 Complete loss of teeth, unspecified cause, unspecified class: Secondary | ICD-10-CM | POA: Insufficient documentation

## 2019-10-23 DIAGNOSIS — G4700 Insomnia, unspecified: Secondary | ICD-10-CM | POA: Insufficient documentation

## 2019-10-23 NOTE — Progress Notes (Addendum)
SLEEP MEDICINE CLINIC    Provider:  Melvyn Novasarmen  Carnelius Hammitt, MD  Primary Care Physician:  Freddy FinnerMills, Hannah M, NP [address] [place] [id number]     Referring Provider: Freddy FinnerMills, Hannah M, Np [address] [place] [id number]          Chief Complaint according to patient   Patient presents with:    . New Patient (Initial Visit)     rm 10. presents today in need of setting up with a new machine. Last SS was 02/08/2019. he is not established with DME company. pt machine is old and no longer working well. In need of new machine. HST was only performed 12- 2020 and interpreted by Dr. Gerilyn Pilgrimoonquah- we now follow up.       HISTORY OF PRESENT ILLNESS:  Thomas Rollins is a 59  Year-old  Caucasian male patient from [place] and seen upon an urgent referral on 10/23/2019 from NP Tereasa CoopHannah Mills, NP. I have the pleasure of seeing Thomas Rollins today, a righyt-handed White or Caucasian male who has a  has a past medical history of Anxiety, Chronic pain, Depression, Diabetes mellitus without complication (HCC), Hyperlipidemia, and Hypertension. He weighted 351 pounds when he fell and became disabled.   Chief concern according to patient : Patient has been followed by Dr.Hawkins who has retired. He claims he had no follow up since his HST 01-2019. Thomas Rollins reports that he under went a UPPP, about 25 years ago- He has been a CPAP user for over 2 decades and his machine is truly an antique, as he can show me here. He has nasal septal surgery as well.  He has been using a nasal mask and underwent a home sleep test at any [facility] ordered by Dr. Juanetta GoslingHawkins.  Apparently this was an Allis testing device used on 12-9 2020.  The patient stated that then his doctor he was informed that the doctor who referred him had retired and nobody followed up.  His AHI was 6.4 which is a mild sleep apnea total number of events was 60 more events were noticed when he was on the right side sleeping or in supine position  sleeping on his back. He uses hs machine even when he takes a nap.   He has been faithful use of CPAP mainly because he feels as if he suffocates and cannot really sleep without it.  He is routinely in need of a new machine.  I would like to add that the nadir of oxygen saturation was 73% during his home sleep test and that his heart rate varied very greatly between 51 and 255 bpm I am thinking that this may be a technical error.  He did snore loudly.   My nurse was able to get a download of the C-Flex machine that shows an average AHI of still 5.2/h with a CPAP setting of only 7 cmH2O 100% compliance.  Thomas Rollins presented to his nurse practitioner, who is now functioning as his primary care provider recently , several days after a tick bite and he states that he was bitten by more than 2 ticks and that he developed chills, low-grade fever and fatigue.  Mild aches all over.  He did not present with symptoms concerning for COVID-19 infection.  He is fully vaccinated.   Family medical /sleep history: he lost his 61 year old daughter due to cancer 3 years ago, a second daughter also has cancer.   Social  history: he went to school until 67 th garde. Was a single parent , raised his second daughter alone. 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 and lives alone. Tobacco use never .Marihuana : not since spring of this year-   ETOH use seldomly , he used to take xanax and sleeping pills.  Caffeine intake in form of Coffee( /) Soda( /) Tea ( /) or energy drinks. Chocolate milk shake 3-4 times a week.  Regular exercise - none      Sleep habits are as follows: The patient's dinner time is between 4.30 PM. The patient goes to bed at 11 PM and continues to sleep for 2-3 hours, wakes for one of two bathroom breaks.   The preferred sleep position is left, with the support of 1 pillows.  Dreams are reportedly  Frequent/vivid. Nightmares.  Wakes with palpitations.  7 AM is the usual rise time. The patient wakes up spontaneously at 6.30AM.   He reports not feeling refreshed or restored in AM, with symptoms such as dry mouth,Naps are avoided.   Review of Systems: Out of a complete 14 system review, the patient complains of only the following symptoms, and all other reviewed systems are negative.:  Fatigue, sleepiness , snoring, fragmented sleep, nocturia. Vivid dreams , nightmares.    How likely are you to doze in the following situations: 0 = not likely, 1 = slight chance, 2 = moderate chance, 3 = high chance   Sitting and Reading? Watching Television? Sitting inactive in a public place (theater or meeting)? As a passenger in a car for an hour without a break? Lying down in the afternoon when circumstances permit? Sitting and talking to someone? Sitting quietly after lunch without alcohol? In a car, while stopped for a few minutes in traffic?   Total = 4/ 24 points   FSS endorsed at 30/ 63 points.   Social History   Socioeconomic History  . Marital status: Divorced    Spouse name: Not on file  . Number of children: 1 living   . Years of education: Not on file  . Highest education level: 12th grade  Occupational History  . Not on file  Tobacco Use  . Smoking status: Never Smoker  . Smokeless tobacco: Never Used  Vaping Use  . Vaping Use: Never used  Substance and Sexual Activity  . Alcohol use: Yes    Comment: occ  . Drug use: Yes    Types: Marijuana    Comment: 1-2 times a year   . Sexual activity: Never  Other Topics Concern  . 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   Takes time release morphine b/c fell 15-20 feet from a building while working.    Now disabled        Exercises/walks: 7 days a week   Has pets at home: 5 dogs in the home.      Diet:  Avoids white foods, fruits, veggies, does eat some fast food/eat out    Caffeine: none only hot chocolate or soda when stomach is upset   Water: 8 cups daily if not more      Wears seat belt   Smoke detectors and carbon  monoxide detectors at home    Does not use phone while driving                      Social Determinants of Health   Financial Resource Strain: Low Risk   . Difficulty of Paying Living Expenses: Not hard at all  Food Insecurity: Food Insecurity Present  . Worried About Programme researcher, broadcasting/film/video in the Last Year: Sometimes true  . Ran Out of Food in the Last Year: Sometimes true  Transportation Needs: No Transportation Needs  . Lack of Transportation (Medical): No  . Lack of Transportation (Non-Medical): No  Physical Activity: Sufficiently Active  . Days of Exercise per Week: 7 days  . Minutes of Exercise per Session: 30 min  Stress: Stress Concern Present  . Feeling of Stress : Rather much  Social Connections: Socially Isolated  . Frequency of Communication with Friends and Family: More than three times a week  . Frequency of Social Gatherings with Friends and Family: Three times a week  . Attends Religious Services: Never  . Active Member of Clubs or Organizations: No  . Attends Banker Meetings: Never  . Marital Status: Divorced    Family History  Problem Relation Age of Onset  . Stomach cancer Mother   . Heart Problems Father   . Dementia Father   . Cancer Father   . Bone cancer Brother   . Lung cancer Brother   . Cancer Daughter     Past Medical History:  Diagnosis Date  . Anxiety    panic attacks  . Chronic pain   . Depression   . Diabetes mellitus without complication (HCC)   . Hyperlipidemia   . Hypertension     Past Surgical History:  Procedure Laterality Date  . ankle sx    . ARTHROSCOPIC REPAIR ACL    . CARPAL TUNNEL RELEASE    . HERNIA REPAIR    . SHOULDER ARTHROSCOPY       Current Outpatient Medications on File  Prior to Visit  Medication Sig Dispense Refill  . ALPHAGAN P 0.1 % SOLN Apply 1 drop to eye 2 (two) times daily.    . busPIRone (BUSPAR) 10 MG tablet Take 1 tablet (10 mg total) by mouth 3 (three) times daily. 90 tablet 1  . DULoxetine (CYMBALTA) 60 MG capsule Take 1 capsule (60 mg total) by mouth daily. 90 capsule 0  . FARXIGA 10 MG TABS tablet TAKE 1 TABLET BY MOUTH EVERY DAY 90 tablet 0  . fluticasone (FLONASE) 50 MCG/ACT nasal spray USE ONE SPRAY in each nostril ONCE EVERY DAY  0  . gabapentin (NEURONTIN) 300 MG capsule Take 1 capsule (300 mg total) by mouth 3 (three) times daily. (Patient taking differently: Take 300 mg by mouth daily. ) 90 capsule 0  . glipiZIDE (GLUCOTROL XL) 10 MG 24 hr tablet Take 1 tablet (10 mg total) by mouth daily with breakfast. 30 tablet 0  . GLOBAL EASE INJECT PEN NEEDLES 31G X 8 MM MISC 1 each by Other route as directed.    [name] levothyroxine (SYNTHROID) 125 MCG tablet TAKE 1 TABLET BY MOUTH IN THE MORNING ON AN EMPTY STOMACH ONCE DAILY 30 tablet 2  . lidocaine (LIDODERM) 5 % Place 1 patch onto the skin daily. Remove & Discard patch within 12 hours or as directed by MD 30 patch 0  . metFORMIN (GLUCOPHAGE) 1000 MG tablet TAKE 1 TABLET BY MOUTH TWICE DAILY FOR 30 DAYS  60 tablet 2  . morphine (MS CONTIN) 30 MG 12 hr tablet Take 1 tablet by mouth 2 (two) times daily. Take with the 60mg      . morphine (MS CONTIN) 60 MG 12 hr tablet Take 1 tablet by mouth 2 (two) times daily. Takes with the 30mg      . Omega-3 Fatty Acids (FISH OIL PO) Take 1 capsule by mouth daily.    . simvastatin (ZOCOR) 40 MG tablet TAKE 1 TABLET BY MOUTH IN THE EVENING ONCE A DAILY EMERGENCY REFILL FAXED DR 30 tablet 3  . TRESIBA FLEXTOUCH 200 UNIT/ML FlexTouch Pen Inject 50-52 Units into the skin at bedtime. (Patient taking differently: Inject 52-56 Units into the skin at bedtime. ) 15 pen 1   No current facility-administered medications on file prior to visit.    Allergies  Allergen Reactions  .  Codeine Other (See Comments)    Slurred Speech   . Other     , sweat potatoes,   . Penicillins Rash    Physical exam:  Today's Vitals   10/23/19 1254  BP: (!) 143/86  Pulse: 91  Weight: 245 lb (111.1 kg)  Height: 5\' 7"  (1.702 m)   Body mass index is 38.37 kg/m.   Wt Readings from Last 3 Encounters:  10/23/19 245 lb (111.1 kg)  10/16/19 243 lb (110.2 kg)  09/17/19 (!) 244 lb (110.7 kg)     Ht Readings from Last 3 Encounters:  10/23/19 5\' 7"  (1.702 m)  10/16/19 5\' 7"  (1.702 m)  09/17/19 5\' 8"  (1.727 m)      General: The patient is awake, alert and appears not in acute distress. The patient is well groomed. Head: Normocephalic, atraumatic. Neck is supple. Mallampati 2- he is not status post UPPP., he has a UVULA - lateral narrowing.  neck circumference: inches . Nasal airflow barely patent.  small mouth Dental status: edentulous.  Cardiovascular:  Regular rate and cardiac rhythm by pulse,  without distended neck veins. Respiratory: Lungs are clear to auscultation.  Skin:  Without evidence of ankle edema, or rash. Trunk: The patient's posture is erect.   Neurologic exam : The patient is awake and alert, oriented to place and time.   Memory subjective described as intact. Logorrhoeic.  Attention span & concentration ability appears limited  Speech is fluent,  without  dysarthria, dysphonia or aphasia.  Mood and affect are depressed.    Cranial nerves: no loss of smell or taste reported  Pupils are equal and briskly reactive to light. Funduscopic exam deferred.   Extraocular movements in vertical and horizontal planes were intact and without nystagmus. No Diplopia. Visual fields by finger perimetry are intact. Hearing was reduced  to soft voice and finger rubbing.    Facial sensation intact to fine touch.  Facial motor strength is symmetric and tongue and uvula move midline.  Neck ROM : rotation, tilt and flexion extension were normal for age and shoulder shrug  was symmetrical.    Motor exam:  Symmetric bulk, tone and ROM.   Normal tone without cog- wheeling, symmetrically reduced grip strength . He has trouble to extent his fingers. Reports he has cervical DDD.    Sensory:  Fine touch  and vibration were not tested.  Proprioception tested in the upper extremities was normal.   Coordination: Rapid alternating movements in the fingers/hands were of normal speed.  The Finger-to-nose maneuver was intact without evidence of ataxia, dysmetria or tremor.   Gait and station: Patient could rise unassisted  from a seated position, Toe and heel walk were deferred.  Deep tendon reflexes: in the  upper and lower extremities are symmetric and intact.  Babinski response was deferred .      After spending a total time of  45  minutes face to face and additional time for physical and neurologic examination, review of laboratory studies,  personal review of imaging studies, reports and results of other testing and review of referral information / records as far as provided in visit, I have established the following assessments:  1)  The patient reports CPAP dependence- his ENT surgeries were not including UPPP.  2)  His risk factors for OSA have changed , no longer taking  Benzodiazepine , muscle relaxants, but on Morphine-     My Plan is to proceed with: I need to establish a baseline once again.   1) I prefer an attended sleep study with titration after 1 hour baseline. He has 2 dogs that cannot stay home alone.  2) HST is the alternative he likes.    I would like to thank Freddy Finner, NP and Freddy Finner, Np [address] [place],  [place] [address] for allowing me to meet with and to take care of this pleasant patient.   In short, Thomas Rollins will follow up  through our NP within 2-3  Month for CPAP care only. We do not provide chronic pain care, orthopedic DDD pain care or depression treatment. He is established at pain care and orthopedic care in  [place].   CC: I will share my notes with PCP   Electronically signed by: Melvyn Novas, MD 10/23/2019 1:14 PM  [facility] and [facility] Sleep Board certified by The [employer] of Sleep Medicine and Diplomate of the [employer] of Sleep Medicine. Board certified In Neurology through the ABPN, Fellow of the [employer] of Neurology. Medical Director of [facility].

## 2019-10-23 NOTE — Patient Instructions (Signed)

## 2019-10-31 ENCOUNTER — Telehealth: Payer: Self-pay | Admitting: *Deleted

## 2019-10-31 NOTE — Telephone Encounter (Signed)
Pt appt made for tomorrow at 1:40 with Dahlia Client

## 2019-10-31 NOTE — Telephone Encounter (Signed)
Pt stated when he was in hospital 7-25 for neck and shoulder pain they gave him gabapentin 300 mg. He was wondering if you could prescribe him this to hold him over until they could get him in with someone since it is taking so long. This was last prescribed by hospitalist

## 2019-10-31 NOTE — Telephone Encounter (Signed)
I can not refill this medication without a visit. Thanks

## 2019-11-01 ENCOUNTER — Other Ambulatory Visit: Payer: Self-pay

## 2019-11-01 ENCOUNTER — Encounter: Payer: Self-pay | Admitting: Family Medicine

## 2019-11-01 ENCOUNTER — Telehealth (INDEPENDENT_AMBULATORY_CARE_PROVIDER_SITE_OTHER): Payer: Medicare HMO | Admitting: Family Medicine

## 2019-11-01 VITALS — BP 143/86 | Ht 67.0 in | Wt 245.0 lb

## 2019-11-01 DIAGNOSIS — M792 Neuralgia and neuritis, unspecified: Secondary | ICD-10-CM | POA: Diagnosis not present

## 2019-11-01 DIAGNOSIS — F410 Panic disorder [episodic paroxysmal anxiety] without agoraphobia: Secondary | ICD-10-CM

## 2019-11-01 DIAGNOSIS — F419 Anxiety disorder, unspecified: Secondary | ICD-10-CM

## 2019-11-01 MED ORDER — GABAPENTIN 300 MG PO CAPS: 300.0000 mg | ORAL_CAPSULE | Freq: Three times a day (TID) | ORAL | 1 refills | Status: DC

## 2019-11-01 MED ORDER — GABAPENTIN 300 MG PO CAPS: 300.0000 mg | ORAL_CAPSULE | Freq: Two times a day (BID) | ORAL | 1 refills | Status: DC

## 2019-11-01 MED ORDER — BUSPIRONE HCL 10 MG PO TABS: 10.0000 mg | ORAL_TABLET | Freq: Two times a day (BID) | ORAL | 2 refills | Status: DC

## 2019-11-01 NOTE — Progress Notes (Signed)
Virtual Visit via Telephone Note   This visit type was conducted due to national recommendations for restrictions regarding the COVID-19 Pandemic (e.g. social distancing) in an effort to limit this patient's exposure and mitigate transmission in our community.  Due to his co-morbid illnesses, this patient is at least at moderate risk for complications without adequate follow up.  This format is felt to be most appropriate for this patient at this time.  The patient did not have access to video technology/had technical difficulties with video requiring transitioning to audio format only (telephone).  All issues noted in this document were discussed and addressed.  No physical exam could be performed with this format.    Evaluation Performed:  Follow-up visit  Date:  11/01/2019   ID:  Thomas Rollins, DOB 1960-12-12, MRN [id number]  Patient Location: Home Provider Location: Office/Clinic  Location of Patient: Home Location of Provider: Telehealth Consent was obtain for visit to be over via telehealth. I verified that I am speaking with the correct person using two identifiers.  PCP:  Freddy Finner, NP   Chief Complaint:  Mood and neuropathic pain  History of Present Illness:    Thomas Rollins is a 59 y.o. male with history of anxiety, panic attacks, chronic pain, depression, type 2 diabetes, hyperlipidemia, hypertension.  Presents today for follow-up on mood and neuropathic pain.  Reports that he has ran out of the gabapentin that the emergency room put him on secondary to having questionable nerve impingement/entrapment of C 6.  He reports that it was helping so much for him and that he would like to get a refill of this.  He has talked to his chronic pain management provider to see if they can prescribe this as part of his regimen but he has not been in to see them yet. Additionally he was only taking it twice a day not 3 times a day.  Today's anniversary of his daughter's death 3 years  ago.  She is now 58.  He is very down and depressed today.  But reports in general he has had a better mood overall taking the gabapentin as well.  Possibly because is helping his pain as well.  He denies having any other issues or concerns.  Would like refill of his BuSpar to take twice a day and not 3 times a day.  The patient does not have symptoms concerning for COVID-19 infection (fever, chills, cough, or new shortness of breath).   Past Medical, Surgical, Social History, Allergies, and Medications have been Reviewed.  Past Medical History:  Diagnosis Date  . Anxiety    panic attacks  . Chronic pain   . Depression   . Diabetes mellitus without complication (HCC)   . Hyperlipidemia   . Hypertension    Past Surgical History:  Procedure Laterality Date  . ankle sx    . ARTHROSCOPIC REPAIR ACL    . CARPAL TUNNEL RELEASE    . HERNIA REPAIR    . SHOULDER ARTHROSCOPY       Current Meds  Medication Sig  . ALPHAGAN P 0.1 % SOLN Apply 1 drop to eye 2 (two) times daily.  . busPIRone (BUSPAR) 10 MG tablet Take 1 tablet (10 mg total) by mouth 3 (three) times daily.  . DULoxetine (CYMBALTA) 60 MG capsule Take 1 capsule (60 mg total) by mouth daily.  [name] FARXIGA 10 MG TABS tablet TAKE 1 TABLET BY MOUTH EVERY DAY  . fluticasone (FLONASE) 50 MCG/ACT  nasal spray USE ONE SPRAY in each nostril ONCE EVERY DAY  . gabapentin (NEURONTIN) 300 MG capsule Take 1 capsule (300 mg total) by mouth 3 (three) times daily.  [name] glipiZIDE (GLUCOTROL XL) 10 MG 24 hr tablet Take 1 tablet (10 mg total) by mouth daily with breakfast.  . GLOBAL EASE INJECT PEN NEEDLES 31G X 8 MM MISC 1 each by Other route as directed.  [name] levothyroxine (SYNTHROID) 125 MCG tablet TAKE 1 TABLET BY MOUTH IN THE MORNING ON AN EMPTY STOMACH ONCE DAILY  . lidocaine (LIDODERM) 5 % Place 1 patch onto the skin daily. Remove & Discard patch within 12 hours or as directed by MD  . metFORMIN (GLUCOPHAGE) 1000 MG tablet TAKE 1 TABLET BY MOUTH TWICE  DAILY FOR 30 DAYS  . morphine (MS CONTIN) 30 MG 12 hr tablet Take 1 tablet by mouth 2 (two) times daily. Take with the 60mg    . morphine (MS CONTIN) 60 MG 12 hr tablet Take 1 tablet by mouth 2 (two) times daily. Takes with the 30mg    . Omega-3 Fatty Acids (FISH OIL PO) Take 1 capsule by mouth daily.  . simvastatin (ZOCOR) 40 MG tablet TAKE 1 TABLET BY MOUTH IN THE EVENING ONCE A DAILY EMERGENCY REFILL FAXED DR  . FLEXTOUCH 200 UNIT/ML FlexTouch Pen Inject 50-52 Units into the skin at bedtime. (Patient taking differently: Inject 52-56 Units into the skin at bedtime. )     Allergies:   Codeine, Other, and Penicillins   ROS:   Please see the history of present illness.    All other systems reviewed and are negative.   Labs/Other Tests and Data Reviewed:    Recent Labs: 04/06/2019: ALT 53; BUN 19; Creatinine, Ser 0.81; Hemoglobin 17.3; Platelets 223; Potassium 4.6; Sodium 140   Recent Lipid Panel Lab Results  Component Value Date/Time   CHOL 131 04/06/2019 10:00 AM   TRIG 101 04/06/2019 10:00 AM   HDL 45 04/06/2019 10:00 AM   CHOLHDL 3.7 02/05/2017 11:01 AM   LDLCALC 67 04/06/2019 10:00 AM   LDLCALC 87 02/05/2017 11:01 AM    Wt Readings from Last 3 Encounters:  11/01/19 245 lb (111.1 kg)  10/23/19 245 lb (111.1 kg)  10/16/19 243 lb (110.2 kg)     Objective:    Vital Signs:  BP (!) 143/86   Ht 5\' 7"  (1.702 m)   Wt 245 lb (111.1 kg)   BMI 38.37 kg/m     VITAL SIGNS:  reviewed GEN:  alert and oriented RESPIRATORY:  no shortness of breath in conversation  PSYCH:  depressed affect and mood    ASSESSMENT & PLAN:    1. Neuropathic pain  - gabapentin (NEURONTIN) 300 MG capsule; Take 1 capsule (300 mg total) by mouth 2 (two) times daily.  Dispense: 90 capsule; Refill: 1  2. Anxiety  - busPIRone (BUSPAR) 10 MG tablet; Take 1 tablet (10 mg total) by mouth 2 (two) times daily.  Dispense: 60 tablet; Refill: 2  3. Panic attacks  - busPIRone (BUSPAR) 10 MG tablet;  Take 1 tablet (10 mg total) by mouth 2 (two) times daily.  Dispense: 60 tablet; Refill: 2   Time:   Today, I have spent 10 minutes with the patient with telehealth technology discussing the above problems.     Medication Adjustments/Labs and Tests Ordered: Current medicines are reviewed at length with the patient today.  Concerns regarding medicines are outlined above.   Tests Ordered: No orders of the defined types  were placed in this encounter.   Medication Changes: No orders of the defined types were placed in this encounter.   Disposition:  Follow up 3 months  Signed, Freddy Finner, NP  11/01/2019 1:47 PM     [facility]

## 2019-11-01 NOTE — Patient Instructions (Signed)
I appreciate the opportunity to provide you with care for your health and wellness. Today we discussed: neuropathic pain   Follow up: 3 months   No labs or referrals today  Continue to take the gabapentin as directed.  If pain management wants to prescribe this they can. Just let me know if they take it over.  Please continue to practice social distancing to keep you, your family, and our community safe.  If you must go out, please wear a mask and practice good handwashing.  It was a pleasure to see you and I look forward to continuing to work together on your health and well-being. Please do not hesitate to call the office if you need care or have questions about your care.  Have a wonderful day and week. With Gratitude, Tereasa Coop, DNP, AGNP-BC

## 2019-11-09 ENCOUNTER — Telehealth: Payer: Self-pay | Admitting: Family Medicine

## 2019-11-09 NOTE — Telephone Encounter (Signed)
Pt needs an appt atleast virtually please schedule

## 2019-11-09 NOTE — Telephone Encounter (Signed)
Patient states that he has poison oak all over his legs that have now turned into blisters. Requesting something called in.  [facility] [place], [place] - [address] Phone:  [phone]  Fax:  [phone]

## 2019-11-10 ENCOUNTER — Telehealth (INDEPENDENT_AMBULATORY_CARE_PROVIDER_SITE_OTHER): Payer: Medicare HMO | Admitting: Family Medicine

## 2019-11-10 ENCOUNTER — Other Ambulatory Visit: Payer: Self-pay

## 2019-11-10 ENCOUNTER — Encounter: Payer: Self-pay | Admitting: Family Medicine

## 2019-11-10 ENCOUNTER — Other Ambulatory Visit: Payer: Self-pay | Admitting: *Deleted

## 2019-11-10 VITALS — BP 143/86 | Ht 67.0 in | Wt 245.0 lb

## 2019-11-10 DIAGNOSIS — E119 Type 2 diabetes mellitus without complications: Secondary | ICD-10-CM

## 2019-11-10 DIAGNOSIS — L255 Unspecified contact dermatitis due to plants, except food: Secondary | ICD-10-CM | POA: Diagnosis not present

## 2019-11-10 MED ORDER — DAPAGLIFLOZIN PROPANEDIOL 10 MG PO TABS: 10.0000 mg | ORAL_TABLET | Freq: Every day | ORAL | 0 refills | Status: DC

## 2019-11-10 MED ORDER — PREDNISONE 10 MG (21) PO TBPK: ORAL_TABLET | ORAL | 0 refills | Status: DC

## 2019-11-10 NOTE — Progress Notes (Signed)
Virtual Visit via Telephone Note   This visit type was conducted due to national recommendations for restrictions regarding the COVID-19 Pandemic (e.g. social distancing) in an effort to limit this patient's exposure and mitigate transmission in our community.  Due to his co-morbid illnesses, this patient is at least at moderate risk for complications without adequate follow up.  This format is felt to be most appropriate for this patient at this time.  The patient did not have access to video technology/had technical difficulties with video requiring transitioning to audio format only (telephone).  All issues noted in this document were discussed and addressed.  No physical exam could be performed with this format.    Evaluation Performed:  Follow-up visit  Date:  11/10/2019   ID:  Thomas Rollins, DOB 1960-10-20, MRN [id number]  Patient Location: Home Provider Location: Office/Clinic  Location of Patient: Home Location of Provider: Telehealth Consent was obtain for visit to be over via telehealth. I verified that I am speaking with the correct person using two identifiers.  PCP:  Freddy Finner, NP   Chief Complaint:  Rash from poison oak  History of Present Illness:    Thomas Rollins is a 59 y.o. male with history of anxiety, depression, chronic pain, hypertension, diabetes.  Reports today after having increased rash over his body that started on his legs now spread to his arms and his upper abdomen.  He reports last week he was cutting a tree in his yard he noted a line that he was like this does not look like poison ivy he did not think it would be poison oak or anything of the nature.  He proceeded to help cut down a tree and within 24 hours he was having dermatitis with clear blisters, bubbling up.  He has tried home remedies which included spraying bleach, rubbing alcohol, calamine lotion.  He is not had any success with this but rather just bread continuously.  He reports he is  miserable with itching and and some discomfort and pain especially when the blister start.  He reports no signs of infection no excessive pus.  No fevers no chills no nausea no vomiting the rash is not located on his face near his eyes near his mouth.  He denies having any chest pain, headaches, vision changes, palpitations, leg swelling, dizziness.  The patient does not have symptoms concerning for COVID-19 infection (fever, chills, cough, or new shortness of breath).   Past Medical, Surgical, Social History, Allergies, and Medications have been Reviewed.  Past Medical History:  Diagnosis Date  . Anxiety    panic attacks  . Chronic pain   . Depression   . Diabetes mellitus without complication (HCC)   . Encounter for hepatitis C screening test for low risk patient 04/05/2019  . Hyperlipidemia   . Hypertension    Past Surgical History:  Procedure Laterality Date  . ankle sx    . ARTHROSCOPIC REPAIR ACL    . CARPAL TUNNEL RELEASE    . HERNIA REPAIR    . SHOULDER ARTHROSCOPY       No outpatient medications have been marked as taking for the 11/10/19 encounter (Video Visit) with Freddy Finner, NP.     Allergies:   Codeine, Other, and Penicillins   ROS:   Please see the history of present illness.    All other systems reviewed and are negative.   Labs/Other Tests and Data Reviewed:    Recent Labs: 04/06/2019: ALT 53;  BUN 19; Creatinine, Ser 0.81; Hemoglobin 17.3; Platelets 223; Potassium 4.6; Sodium 140   Recent Lipid Panel Lab Results  Component Value Date/Time   CHOL 131 04/06/2019 10:00 AM   TRIG 101 04/06/2019 10:00 AM   HDL 45 04/06/2019 10:00 AM   CHOLHDL 3.7 02/05/2017 11:01 AM   LDLCALC 67 04/06/2019 10:00 AM   LDLCALC 87 02/05/2017 11:01 AM    Wt Readings from Last 3 Encounters:  11/10/19 245 lb (111.1 kg)  11/01/19 245 lb (111.1 kg)  10/23/19 245 lb (111.1 kg)     Objective:    Vital Signs:  BP (!) 143/86   Ht 5\' 7"  (1.702 m)   Wt 245 lb (111.1 kg)    BMI 38.37 kg/m    VITAL SIGNS:  reviewed GEN:  Alert and oriented RESPIRATORY:  No shortness of breath noted in conversation PSYCH:  Normal affect and mood  ASSESSMENT & PLAN:    1. Dermatitis due to plants, including poison ivy, sumac, and oak  - predniSONE (STERAPRED UNI-PAK 21 TAB) 10 MG (21) TBPK tablet; Take as directed  Dispense: 21 tablet; Refill: 0   Time:   Today, I have spent 5 minutes with the patient with telehealth technology discussing the above problems.     Medication Adjustments/Labs and Tests Ordered: Current medicines are reviewed at length with the patient today.  Concerns regarding medicines are outlined above.   Tests Ordered: No orders of the defined types were placed in this encounter.   Medication Changes: No orders of the defined types were placed in this encounter.   Disposition:  Follow up as scheduled Signed, , NP  11/10/2019 8:26 AM     11/12/[facility]

## 2019-11-10 NOTE — Assessment & Plan Note (Signed)
Prednisone Dosepak sent in secondary to the amount of body that the poison oak has covered. Encouraged the use of topical hydrocortisone and Benadryl cream discouraged the use of spray bleach and rubbing alcohol.  Reviewed side effects, risks and benefits of medication.    Patient acknowledged agreement and understanding of the plan.   [name]

## 2019-11-10 NOTE — Patient Instructions (Signed)
I appreciate the opportunity to provide you with care for your health and wellness. Today we discussed: Poison oak rash  Follow up: As scheduled  No labs or referrals today  Prednisone medication, take as directed.  Pick up Benadryl cream and/or hydrocortisone over-the-counter to use topically as needed  Please continue to practice social distancing to keep you, your family, and our community safe.  If you must go out, please wear a mask and practice good handwashing.  It was a pleasure to see you and I look forward to continuing to work together on your health and well-being. Please do not hesitate to call the office if you need care or have questions about your care.  Have a wonderful day and week. With Gratitude, Tereasa Coop, DNP, AGNP-BC

## 2019-11-14 ENCOUNTER — Other Ambulatory Visit: Payer: Self-pay | Admitting: *Deleted

## 2019-11-14 MED ORDER — TRESIBA FLEXTOUCH 200 UNIT/ML ~~LOC~~ SOPN: 50.0000 [IU] | PEN_INJECTOR | Freq: Every day | SUBCUTANEOUS | 0 refills | Status: DC

## 2019-11-20 ENCOUNTER — Other Ambulatory Visit: Payer: Medicare HMO

## 2019-11-20 ENCOUNTER — Other Ambulatory Visit: Payer: Self-pay | Admitting: Critical Care Medicine

## 2019-11-20 ENCOUNTER — Other Ambulatory Visit: Payer: Self-pay

## 2019-11-20 DIAGNOSIS — Z20822 Contact with and (suspected) exposure to covid-19: Secondary | ICD-10-CM

## 2019-11-21 ENCOUNTER — Other Ambulatory Visit: Payer: Self-pay

## 2019-11-21 LAB — SPECIMEN STATUS REPORT

## 2019-11-21 LAB — SARS-COV-2, NAA 2 DAY TAT

## 2019-11-21 LAB — NOVEL CORONAVIRUS, NAA: SARS-CoV-2, NAA: NOT DETECTED

## 2019-11-22 ENCOUNTER — Ambulatory Visit: Payer: Medicare HMO | Admitting: Family Medicine

## 2019-11-22 ENCOUNTER — Ambulatory Visit (INDEPENDENT_AMBULATORY_CARE_PROVIDER_SITE_OTHER): Payer: Medicare HMO | Admitting: Neurology

## 2019-11-22 DIAGNOSIS — G4709 Other insomnia: Secondary | ICD-10-CM

## 2019-11-22 DIAGNOSIS — Z9989 Dependence on other enabling machines and devices: Secondary | ICD-10-CM

## 2019-11-22 DIAGNOSIS — G4733 Obstructive sleep apnea (adult) (pediatric): Secondary | ICD-10-CM | POA: Diagnosis not present

## 2019-11-22 DIAGNOSIS — K08109 Complete loss of teeth, unspecified cause, unspecified class: Secondary | ICD-10-CM

## 2019-11-30 NOTE — Procedures (Signed)
  Patient Information     First Name: Thomas Last Name: Rollins ID: [id number]  Birth Date: [birth year] Age: 59 Gender: Male  Referring Provider: Freddy Finner, NP BMI: 38.4 (W=244 lb, H=5' 7'')    Epworth:  4/24   Sleep Study Information    Study Date: 11/22/19 S/H/A Version: [id number] / 4.2.1023 / 79  History:    Thomas Rollins is a 37-Year-old Caucasian male patient and originally from [place]. He is seen upon an urgent referral on 10/23/2019 from NP Tereasa Coop.  Thomas Rollins, a right-handed Caucasian male , has a past medical history of Anxiety, Chronic pain, Depression, Diabetes mellitus without complication (HCC), Hyperlipidemia, and Hypertension. He weighed 351 pounds when he fell and became disabled. [name] reports that he underwent a UPPP, about 25 years ago- He has been a CPAP user for over 2 decades and his machine is truly an antique. He has nasal septal surgery as well.  He has been using a nasal mask and underwent a home sleep test at any [facility] ordered by Dr. Juanetta Gosling. Apparently, this was an Alice 4 testing device used on 12-9 2020.  The patient stated that then he was informed that the doctor who referred him had retired, and nobody followed up.  His AHI was 6.4 which is a mild sleep apnea total number of events was 60 more events were noticed when he was on the right side sleeping or in supine position sleeping on his back.  He uses his machine at 7 cm water pressure even when he takes a nap.  Summary & Diagnosis:    The diagnosis of severe sleep apnea was confirmed at an AHI of 33.6/h. unfortunately, the testing device was unable to differentiate between NREM and REM sleep. Sleep apnea was mostly influenced by supine position. Sleep appears to be extremely fragmented and - the patient is likely CPAP dependent.   Recommendations:     Urgent replacement of the current machine with auto CPAP , settings from 5 through 12 cm Water with 2 cm EPR and mask of patient's choice.    Interpreting Physician: Jacklynn Ganong, MD           Sleep Summary  Oxygen Saturation Statistics   Start Study Time: End Study Time: Total Recording Time:  8:34:59 PM 4:19:18 AM   7 h, 44 min  Total Sleep Time % REM of Sleep Time:  2 h, 40 min  11.8    Mean: 94 Minimum: 89 Maximum: 99  Mean of Desaturations Nadirs (%):   92  Oxygen Desaturation. %: 4-9 10-20 >20 Total  Events Number Total  39 100.0  0 0.0  0 0.0  39 100.0  Oxygen Saturation: <90 <=88 <85 <80 <70  Duration (minutes): Sleep % 0.1 0.0 0.1 0.0 0.0 0.0 0.0 0.0 0.0 0.0     Respiratory Indices      Total Events REM NREM All Night  pRDI: pAHI 3%: ODI 4%: pAHIc 3%: % CSR: pAHI 4%:  96  90  39  29 0.0 50 N/A N/A N/A N/A N/A N/A N/A N/A 35.9 33.6 14.6 10.8 18.7       Pulse Rate Statistics during Sleep (BPM)      Mean: 84 Minimum: 49 Maximum: 103    Indices are calculated using technically valid sleep time of 2 h, 40 min.

## 2019-11-30 NOTE — Progress Notes (Signed)
CPAP dependent patient , unable to get quality sleep without CPAP. Needs urgent replacement. He uses his machine at 7 cm water pressure even when he takes a  nap.   Summary & Diagnosis:   The diagnosis of severe sleep apnea was confirmed at an AHI of  33.6/h. unfortunately, the testing device was unable to  differentiate between NREM and REM sleep. Sleep apnea was mostly  influenced by supine position. Sleep appears to be extremely  fragmented and - the patient is likely CPAP dependent.   Recommendations:    Urgent replacement of the current machine with auto CPAP ,  settings from 5 through 12 cm Water pressure with  humidity, 2 cm EPR , and mask of  patient's choice.   Interpreting Physician: Jacklynn Ganong, MD

## 2019-11-30 NOTE — Addendum Note (Signed)
Addended by: Melvyn Novas on: 11/30/2019 05:05 PM   Modules accepted: Orders

## 2019-12-04 ENCOUNTER — Telehealth: Payer: Self-pay | Admitting: Neurology

## 2019-12-04 NOTE — Telephone Encounter (Signed)
-----   Message from Melvyn Novas, MD sent at 11/30/2019  5:05 PM EDT ----- CPAP dependent patient , unable to get quality sleep without CPAP. Needs urgent replacement. He uses his machine at 7 cm water pressure even when he takes a  nap.   Summary & Diagnosis:   The diagnosis of severe sleep apnea was confirmed at an AHI of  33.6/h. unfortunately, the testing device was unable to  differentiate between NREM and REM sleep. Sleep apnea was mostly  influenced by supine position. Sleep appears to be extremely  fragmented and - the patient is likely CPAP dependent.   Recommendations:    Urgent replacement of the current machine with auto CPAP ,  settings from 5 through 12 cm Water pressure with  humidity, 2 cm EPR , and mask of  patient's choice.   Interpreting Physician: Jacklynn Ganong, MD

## 2019-12-04 NOTE — Telephone Encounter (Signed)
I called pt. I advised pt that Dr. Vickey Huger reviewed their sleep study results and found that pt has severe apnea. Dr. Vickey Huger recommends that pt starts auto CPAP. I reviewed PAP compliance expectations with the pt. Pt is agreeable to starting a CPAP. I advised pt that an order will be sent to a DME, [facility], and [facility] will call the pt within about one week after they file with the pt's insurance. [facility] will show the pt how to use the machine, fit for masks, and troubleshoot the CPAP if needed. A follow up appt will need to be made 31-90 days after starting the new machine.  Pt verbalized understanding. A letter with all of this information in it will be mailed to the pt as a reminder. I verified with the pt that the address we have on file is correct. Pt verbalized understanding of results. Pt had no questions at this time but was encouraged to call back if questions arise. I have sent the order to [facility] and have received confirmation that they have received the order.

## 2019-12-06 ENCOUNTER — Other Ambulatory Visit: Payer: Self-pay | Admitting: Family Medicine

## 2019-12-25 ENCOUNTER — Other Ambulatory Visit: Payer: Self-pay | Admitting: Family Medicine

## 2019-12-28 ENCOUNTER — Telehealth: Payer: Self-pay

## 2019-12-28 ENCOUNTER — Other Ambulatory Visit: Payer: Self-pay

## 2019-12-28 MED ORDER — DULOXETINE HCL 60 MG PO CPEP: 60.0000 mg | ORAL_CAPSULE | Freq: Every day | ORAL | 0 refills | Status: DC

## 2019-12-28 NOTE — Telephone Encounter (Signed)
Pt called wanting to know if he could start something for low testosterone? He has not had labs drawn to check for this so he is willing to have that done or if there are any otc supplements you advise for him to take? He recently got into a relationship and feels as if he may need something. Please advise.

## 2019-12-28 NOTE — Telephone Encounter (Signed)
Needs appt so can discuss and labs. I do not prescribe testosterone, I usually refer to Endo for those needs. But we should discuss what the concerns are and then develop a plan. thanks

## 2020-01-01 ENCOUNTER — Other Ambulatory Visit: Payer: Self-pay

## 2020-01-01 MED ORDER — GLIPIZIDE ER 10 MG PO TB24: 10.0000 mg | ORAL_TABLET | Freq: Every day | ORAL | 0 refills | Status: DC

## 2020-01-01 NOTE — Telephone Encounter (Signed)
Pt informed. Appt made

## 2020-01-03 ENCOUNTER — Ambulatory Visit: Payer: Medicare HMO | Admitting: Family Medicine

## 2020-01-04 ENCOUNTER — Other Ambulatory Visit: Payer: Self-pay | Admitting: Family Medicine

## 2020-01-13 ENCOUNTER — Other Ambulatory Visit: Payer: Self-pay | Admitting: Family Medicine

## 2020-01-23 ENCOUNTER — Other Ambulatory Visit: Payer: Self-pay

## 2020-01-23 DIAGNOSIS — E119 Type 2 diabetes mellitus without complications: Secondary | ICD-10-CM

## 2020-01-23 MED ORDER — TRESIBA FLEXTOUCH 200 UNIT/ML ~~LOC~~ SOPN
50.0000 [IU] | PEN_INJECTOR | Freq: Every day | SUBCUTANEOUS | 0 refills | Status: DC
Start: 2020-01-23 — End: 2020-01-31

## 2020-01-23 MED ORDER — SIMVASTATIN 40 MG PO TABS
ORAL_TABLET | ORAL | 3 refills | Status: DC
Start: 2020-01-23 — End: 2020-05-13

## 2020-01-23 MED ORDER — DAPAGLIFLOZIN PROPANEDIOL 10 MG PO TABS: 10.0000 mg | ORAL_TABLET | Freq: Every day | ORAL | 0 refills | Status: DC

## 2020-01-30 ENCOUNTER — Other Ambulatory Visit: Payer: Self-pay

## 2020-01-30 DIAGNOSIS — M792 Neuralgia and neuritis, unspecified: Secondary | ICD-10-CM

## 2020-01-30 MED ORDER — GABAPENTIN 300 MG PO CAPS: 300.0000 mg | ORAL_CAPSULE | Freq: Two times a day (BID) | ORAL | 1 refills | Status: DC

## 2020-01-30 MED ORDER — GLIPIZIDE ER 10 MG PO TB24
10.0000 mg | ORAL_TABLET | Freq: Every day | ORAL | 0 refills | Status: DC
Start: 2020-01-30 — End: 2020-03-26

## 2020-01-31 ENCOUNTER — Ambulatory Visit (INDEPENDENT_AMBULATORY_CARE_PROVIDER_SITE_OTHER): Payer: Medicare HMO | Admitting: Family Medicine

## 2020-01-31 ENCOUNTER — Encounter: Payer: Self-pay | Admitting: Family Medicine

## 2020-01-31 ENCOUNTER — Other Ambulatory Visit: Payer: Self-pay

## 2020-01-31 VITALS — BP 162/84 | HR 70 | Temp 97.9°F | Ht 67.0 in | Wt 262.0 lb

## 2020-01-31 DIAGNOSIS — Z794 Long term (current) use of insulin: Secondary | ICD-10-CM

## 2020-01-31 DIAGNOSIS — F419 Anxiety disorder, unspecified: Secondary | ICD-10-CM

## 2020-01-31 DIAGNOSIS — F339 Major depressive disorder, recurrent, unspecified: Secondary | ICD-10-CM | POA: Diagnosis not present

## 2020-01-31 DIAGNOSIS — G4733 Obstructive sleep apnea (adult) (pediatric): Secondary | ICD-10-CM | POA: Insufficient documentation

## 2020-01-31 DIAGNOSIS — I10 Essential (primary) hypertension: Secondary | ICD-10-CM

## 2020-01-31 DIAGNOSIS — E119 Type 2 diabetes mellitus without complications: Secondary | ICD-10-CM

## 2020-01-31 DIAGNOSIS — E039 Hypothyroidism, unspecified: Secondary | ICD-10-CM | POA: Diagnosis not present

## 2020-01-31 DIAGNOSIS — E559 Vitamin D deficiency, unspecified: Secondary | ICD-10-CM

## 2020-01-31 DIAGNOSIS — G4730 Sleep apnea, unspecified: Secondary | ICD-10-CM

## 2020-01-31 DIAGNOSIS — Z9989 Dependence on other enabling machines and devices: Secondary | ICD-10-CM

## 2020-01-31 LAB — POCT GLYCOSYLATED HEMOGLOBIN (HGB A1C): Hemoglobin A1C: 9.9 % — AB (ref 4.0–5.6)

## 2020-01-31 MED ORDER — TRESIBA FLEXTOUCH 200 UNIT/ML ~~LOC~~ SOPN: 55.0000 [IU] | PEN_INJECTOR | Freq: Every day | SUBCUTANEOUS | 0 refills | Status: DC

## 2020-01-31 MED ORDER — LISINOPRIL 10 MG PO TABS: 10.0000 mg | ORAL_TABLET | Freq: Every day | ORAL | 2 refills | Status: DC

## 2020-01-31 NOTE — Assessment & Plan Note (Signed)
Reports buspar did not help. Needs therapy- referral made.  Still think complicated grief is an issue as well. He reports wanting xanax again, but I will not prescribe that. He also reports drinking one drink daily now and smoking hemp cigarettes. He is noting eating well or taking care of himself like he was.  He is willing to talk to someone.

## 2020-01-31 NOTE — Assessment & Plan Note (Signed)
Under treatment for this-encouraged use.

## 2020-01-31 NOTE — Patient Instructions (Addendum)
  I appreciate the opportunity to provide you with care for your health and wellness. Today we discussed: overall health   Follow up: 3 months   Labs- non fasting today  Referrals today: Endocrinology for Diabetes, Psychiatry for therapy  Please work on diet changes. I know you know what to do, it is just a matter of one day at a time. Please avoid drinking alcohol as well.  I have increased your Evaristo Bury to 55 units daily  I am also starting a BP medication on your to protect your heart and kidneys.  Please continue to practice social distancing to keep you, your family, and our community safe.  If you must go out, please wear a mask and practice good handwashing.  It was a pleasure to see you and I look forward to continuing to work together on your health and well-being. Please do not hesitate to call the office if you need care or have questions about your care.  Have a wonderful day. With Gratitude, Tereasa Coop, DNP, AGNP-BC

## 2020-01-31 NOTE — Assessment & Plan Note (Signed)
Needs updated labs ordered today. 

## 2020-01-31 NOTE — Assessment & Plan Note (Signed)
Encouraged CPAP use and weight loss.

## 2020-01-31 NOTE — Assessment & Plan Note (Signed)
Reports buspar did not help. Needs therapy- referral made.  Still think complicated grief is an issue as well. He reports wanting xanax again, but I will not prescribe that. He also reports drinking one drink daily now and smoking hemp cigarettes. He is noting eating well or taking care of himself like he was.  He is willing to talk to someone.  

## 2020-01-31 NOTE — Assessment & Plan Note (Signed)
Not controlled.  Thomas Rollins is encouraged to check blood sugar daily as directed. Meds adjusted: tresiba up to 55 units, continue oral agents for now- referral to Endo as well.  Is on statin as well. Started on ace today. Educated on importance of maintain a well balanced diabetic friendly diet. He is reminded the importance of maintaining  good blood sugars,  taking medications as directed, daily foot care, annual eye exams. Additionally educated about keeping good control over blood pressure and cholesterol as well.

## 2020-01-31 NOTE — Assessment & Plan Note (Signed)
Thomas Rollins is encouraged to maintain a well balanced diet that is low in salt. Not Controlled,  Started on ace   Additionally, he is also reminded that exercise is beneficial for heart health and control of  Blood pressure. 30-60 minutes daily is recommended-walking was suggested.

## 2020-01-31 NOTE — Assessment & Plan Note (Signed)
Updated labs- reports feeling down and poorly recently. Might be time change causing decreased sun light as he is outside when he can be.

## 2020-01-31 NOTE — Progress Notes (Signed)
Subjective:  Patient ID: Thomas Rollins, male    DOB: 1960-11-23  Age: 59 y.o. MRN: [id number]  CC:  Chief Complaint  Patient presents with  . Diabetes    f/u      HPI  HPI  Thomas Rollins is a 59 year old male patient of mine who presents to follow up on his diabetes. He was well controlled earlier this year, but has since put on 20 pounds, started eating poorly and not taking care of himself. Though he reports taking his medication as directed. He did start drinking alcohol again- and smoking hemp cigarettes- non tobacco product.  He is having increased anxiety and is very talkative today during the visit but maintains a flat and depressed mood. He misses his daughter terrible, and the holidays make this worse. He needs to be seen by psychiatry willing for me to try to do another referral, as the first time he didn't really connect to the provider.   He denies hypo or hyper gylcemic events. Does not really check his sugar like he was. He knows the foods that are causing the trouble, but can not bring himself to stop. He is willing to have referral to endo for better control given he is on several oral agents as well as tresiba.   Today patient denies signs and symptoms of COVID 19 infection including fever, chills, cough, shortness of breath, and headache. Past Medical, Surgical, Social History, Allergies, and Medications have been Reviewed.   Past Medical History:  Diagnosis Date  . Anxiety    panic attacks  . Chronic pain   . Depression   . Diabetes mellitus without complication (HCC)   . Encounter for hepatitis C screening test for low risk patient 04/05/2019  . Hyperlipidemia   . Hypertension     Current Meds  Medication Sig  . ALPHAGAN P 0.1 % SOLN Apply 1 drop to eye 2 (two) times daily.  . dapagliflozin propanediol (FARXIGA) 10 MG TABS tablet Take 1 tablet (10 mg total) by mouth daily.  . DULoxetine (CYMBALTA) 60 MG capsule Take 1 capsule (60 mg total) by  mouth daily.  . fluticasone (FLONASE) 50 MCG/ACT nasal spray USE ONE SPRAY in each nostril ONCE EVERY DAY  . gabapentin (NEURONTIN) 300 MG capsule Take 1 capsule (300 mg total) by mouth 2 (two) times daily.  [name] glipiZIDE (GLUCOTROL XL) 10 MG 24 hr tablet Take 1 tablet (10 mg total) by mouth daily with breakfast.  . GLOBAL EASE INJECT PEN NEEDLES 31G X 8 MM MISC 1 each by Other route as directed.  [name] levothyroxine (SYNTHROID) 125 MCG tablet TAKE 1 TABLET BY MOUTH IN THE MORNING ON AN EMPTY STOMACH ONCE DAILY  . lidocaine (LIDODERM) 5 % Place 1 patch onto the skin daily. Remove & Discard patch within 12 hours or as directed by MD  . metFORMIN (GLUCOPHAGE) 1000 MG tablet TAKE 1 TABLET BY MOUTH TWICE DAILY FOR 30 DAYS  . morphine (MS CONTIN) 30 MG 12 hr tablet Take 1 tablet by mouth 2 (two) times daily. Take with the 60mg    . morphine (MS CONTIN) 60 MG 12 hr tablet Take 1 tablet by mouth 2 (two) times daily. Takes with the 30mg    . Omega-3 Fatty Acids (FISH OIL PO) Take 1 capsule by mouth daily.  . predniSONE (STERAPRED UNI-PAK 21 TAB) 10 MG (21) TBPK tablet Take as directed  . simvastatin (ZOCOR) 40 MG tablet TAKE 1 TABLET BY MOUTH IN THE  EVENING ONCE A DAILY EMERGENCY REFILL FAXED DR  . Evaristo Bury FLEXTOUCH 200 UNIT/ML FlexTouch Pen Inject 56 Units into the skin daily.  . [DISCONTINUED] busPIRone (BUSPAR) 10 MG tablet Take 1 tablet (10 mg total) by mouth 2 (two) times daily.  . [DISCONTINUED] TRESIBA FLEXTOUCH 200 UNIT/ML FlexTouch Pen Inject 50-52 Units into the skin at bedtime.    ROS:  Review of Systems  Constitutional: Negative.   HENT: Negative.   Eyes: Negative.   Respiratory: Negative.   Cardiovascular: Negative.   Gastrointestinal: Negative.   Genitourinary: Negative.   Musculoskeletal: Negative.   Skin: Negative.   Neurological: Negative.   Endo/Heme/Allergies: Negative.   Psychiatric/Behavioral: Positive for depression. The patient is nervous/anxious.      Objective:   Today's  Vitals: BP (!) 162/84 (BP Location: Right Arm, Patient Position: Sitting, Cuff Size: Normal)   Pulse 70   Temp 97.9 F (36.6 C) (Temporal)   Ht 5\' 7"  (1.702 m)   Wt 262 lb (118.8 kg)   SpO2 98%   BMI 41.04 kg/m  Vitals with BMI 01/31/2020 11/10/2019 11/01/2019  Height 5\' 7"  5\' 7"  5\' 7"   Weight 262 lbs 245 lbs 245 lbs  BMI 41.03 38.36 38.36  Systolic 162 143 01/01/2020  Diastolic 84 86 86  Pulse 70 - -     Physical Exam Vitals and nursing note reviewed.  Constitutional:      Appearance: Normal appearance. He is well-developed and well-groomed. He is morbidly obese.  HENT:     Head: Normocephalic and atraumatic.     Right Ear: External ear normal.     Left Ear: External ear normal.     Mouth/Throat:     Comments: Mask in place  Eyes:     General:        Right eye: No discharge.        Left eye: No discharge.     Conjunctiva/sclera: Conjunctivae normal.  Cardiovascular:     Rate and Rhythm: Normal rate and regular rhythm.     Pulses: Normal pulses.     Heart sounds: Normal heart sounds.  Pulmonary:     Effort: Pulmonary effort is normal.     Breath sounds: Normal breath sounds.  Musculoskeletal:        General: Normal range of motion.     Cervical back: Normal range of motion and neck supple.  Skin:    General: Skin is warm.  Neurological:     General: No focal deficit present.     Mental Status: He is alert and oriented to person, place, and time.  Psychiatric:        Attention and Perception: Attention normal.        Mood and Affect: Mood is depressed. Affect is flat.        Speech: Speech normal.        Behavior: Behavior normal. Behavior is cooperative.        Thought Content: Thought content normal.        Cognition and Memory: Cognition normal.        Judgment: Judgment normal.     Depression screen [facility] 2/9 01/31/2020 11/01/2019 09/06/2019 08/24/2019 05/24/2019  Decreased Interest 0 0 0 0 1  Down, Depressed, Hopeless 1 2 0 0 1  PHQ - 2 Score 1 2 0 0 2  Altered  sleeping - 2 - - 3  Tired, decreased energy - 2 - - 1  Change in appetite - 2 - - 0  Feeling bad  or failure about yourself  - 1 - - 0  Trouble concentrating - 0 - - 0  Moving slowly or fidgety/restless - 1 - - 0  Suicidal thoughts - 0 - - 0  PHQ-9 Score - 10 - - 6  Difficult doing work/chores - Somewhat difficult - - Somewhat difficult     Assessment   1. Depression, recurrent (HCC)   2. Diabetes mellitus type 2, insulin dependent (HCC)   3. Acquired hypothyroidism   4. Vitamin D deficiency   5. Essential hypertension   6. Anxiety   7. CPAP (continuous positive airway pressure) dependence   8. Sleep apnea, unspecified type     Tests ordered Orders Placed This Encounter  Procedures  . CBC  . Comprehensive metabolic panel  . TSH  . VITAMIN D 25 Hydroxy (Vit-D Deficiency, Fractures)  . Ambulatory referral to Endocrinology  . Ambulatory referral to Psychiatry  . POCT glycosylated hemoglobin (Hb A1C)     Plan: Please see assessment and plan per problem list above.   Meds ordered this encounter  Medications  . TRESIBA FLEXTOUCH 200 UNIT/ML FlexTouch Pen    Sig: Inject 56 Units into the skin daily.    Dispense:  3 mL    Refill:  0    Order Specific Question:   Supervising Provider    Answer:   SIMPSON, MARGARET E [[id number]]  . lisinopril (ZESTRIL) 10 MG tablet    Sig: Take 1 tablet (10 mg total) by mouth daily.    Dispense:  30 tablet    Refill:  2    Order Specific Question:   Supervising Provider    Answer:   Kerri Perches [[id number]]    Patient to follow-up in Visit date not found  Note: This dictation was prepared with Dragon dictation along with smaller phrase technology. Similar sounding words can be transcribed inadequately or may not be corrected upon review. Any transcriptional errors that result from this process are unintentional.      Freddy Finner, NP

## 2020-02-01 ENCOUNTER — Other Ambulatory Visit: Payer: Self-pay

## 2020-02-01 DIAGNOSIS — I10 Essential (primary) hypertension: Secondary | ICD-10-CM

## 2020-02-01 DIAGNOSIS — E119 Type 2 diabetes mellitus without complications: Secondary | ICD-10-CM

## 2020-02-01 LAB — COMPREHENSIVE METABOLIC PANEL
ALT: 37 IU/L (ref 0–44)
AST: 51 IU/L — ABNORMAL HIGH (ref 0–40)
Albumin/Globulin Ratio: 1.7 (ref 1.2–2.2)
Albumin: 4.8 g/dL (ref 3.8–4.8)
Alkaline Phosphatase: 91 IU/L (ref 44–121)
BUN/Creatinine Ratio: 13 (ref 10–24)
BUN: 14 mg/dL (ref 8–27)
Bilirubin Total: 0.4 mg/dL (ref 0.0–1.2)
CO2: 23 mmol/L (ref 20–29)
Calcium: 10.1 mg/dL (ref 8.6–10.2)
Chloride: 96 mmol/L (ref 96–106)
Creatinine, Ser: 1.07 mg/dL (ref 0.76–1.27)
GFR calc Af Amer: 86 mL/min/{1.73_m2} (ref >59)
GFR calc non Af Amer: 75 mL/min/{1.73_m2} (ref >59)
Globulin, Total: 2.8 g/dL (ref 1.5–4.5)
Glucose: 274 mg/dL — ABNORMAL HIGH (ref 65–99)
Potassium: 4.3 mmol/L (ref 3.5–5.2)
Sodium: 137 mmol/L (ref 134–144)
Total Protein: 7.6 g/dL (ref 6.0–8.5)

## 2020-02-01 LAB — CBC
Hematocrit: 50.6 % (ref 37.5–51.0)
Hemoglobin: 16.4 g/dL (ref 13.0–17.7)
MCH: 27.9 pg (ref 26.6–33.0)
MCHC: 32.4 g/dL (ref 31.5–35.7)
MCV: 86 fL (ref 79–97)
Platelets: 185 10*3/uL (ref 150–450)
RBC: 5.88 x10E6/uL — ABNORMAL HIGH (ref 4.14–5.80)
RDW: 15.1 % (ref 11.6–15.4)
WBC: 7.7 10*3/uL (ref 3.4–10.8)

## 2020-02-01 LAB — VITAMIN D 25 HYDROXY (VIT D DEFICIENCY, FRACTURES): Vit D, 25-Hydroxy: 33.1 ng/mL (ref 30.0–100.0)

## 2020-02-01 LAB — TSH: TSH: 82.6 u[IU]/mL — ABNORMAL HIGH (ref 0.450–4.500)

## 2020-02-01 MED ORDER — LEVOTHYROXINE SODIUM 200 MCG PO TABS: 200.0000 ug | ORAL_TABLET | Freq: Every day | ORAL | 0 refills | Status: DC

## 2020-02-02 ENCOUNTER — Other Ambulatory Visit: Payer: Self-pay | Admitting: Family Medicine

## 2020-02-02 DIAGNOSIS — E119 Type 2 diabetes mellitus without complications: Secondary | ICD-10-CM

## 2020-02-02 MED ORDER — INSULIN DETEMIR 100 UNIT/ML FLEXPEN: 30.0000 [IU] | PEN_INJECTOR | Freq: Two times a day (BID) | SUBCUTANEOUS | 11 refills | Status: DC

## 2020-02-07 ENCOUNTER — Other Ambulatory Visit: Payer: Self-pay

## 2020-02-07 DIAGNOSIS — E039 Hypothyroidism, unspecified: Secondary | ICD-10-CM

## 2020-02-09 LAB — T3, FREE: T3, Free: 0.6 pg/mL — ABNORMAL LOW (ref 2.0–4.4)

## 2020-02-09 LAB — SPECIMEN STATUS REPORT

## 2020-02-09 LAB — T4, FREE: Free T4: <0.1 ng/dL — ABNORMAL LOW (ref 0.82–1.77)

## 2020-02-29 NOTE — Patient Instructions (Signed)
Please continue using your CPAP regularly. While your insurance requires that you use CPAP at least 4 hours each night on 70% of the nights, I recommend, that you not skip any nights and use it throughout the night if you can. Getting used to CPAP and staying with the treatment long term does take time and patience and discipline. Untreated obstructive sleep apnea when it is moderate to severe can have an adverse impact on cardiovascular health and raise her risk for heart disease, arrhythmias, hypertension, congestive heart failure, stroke and diabetes. Untreated obstructive sleep apnea causes sleep disruption, nonrestorative sleep, and sleep deprivation. This can have an impact on your day to day functioning and cause daytime sleepiness and impairment of cognitive function, memory loss, mood disturbance, and problems focussing. Using CPAP regularly can improve these symptoms.   Follow up in 1 year   Sleep Apnea Sleep apnea affects breathing during sleep. It causes breathing to stop for a short time or to become shallow. It can also increase the risk of:  Heart attack.  Stroke.  Being very overweight (obese).  Diabetes.  Heart failure.  Irregular heartbeat. The goal of treatment is to help you breathe normally again. What are the causes? There are three kinds of sleep apnea:  Obstructive sleep apnea. This is caused by a blocked or collapsed airway.  Central sleep apnea. This happens when the brain does not send the right signals to the muscles that control breathing.  Mixed sleep apnea. This is a combination of obstructive and central sleep apnea. The most common cause of this condition is a collapsed or blocked airway. This can happen if:  Your throat muscles are too relaxed.  Your tongue and tonsils are too large.  You are overweight.  Your airway is too small. What increases the risk?  Being overweight.  Smoking.  Having a small airway.  Being older.  Being  male.  Drinking alcohol.  Taking medicines to calm yourself (sedatives or tranquilizers).  Having family members with the condition. What are the signs or symptoms?  Trouble staying asleep.  Being sleepy or tired during the day.  Getting angry a lot.  Loud snoring.  Headaches in the morning.  Not being able to focus your mind (concentrate).  Forgetting things.  Less interest in sex.  Mood swings.  Personality changes.  Feelings of sadness (depression).  Waking up a lot during the night to pee (urinate).  Dry mouth.  Sore throat. How is this diagnosed?  Your medical history.  A physical exam.  A test that is done when you are sleeping (sleep study). The test is most often done in a sleep lab but may also be done at home. How is this treated?   Sleeping on your side.  Using a medicine to get rid of mucus in your nose (decongestant).  Avoiding the use of alcohol, medicines to help you relax, or certain pain medicines (narcotics).  Losing weight, if needed.  Changing your diet.  Not smoking.  Using a machine to open your airway while you sleep, such as: ? An oral appliance. This is a mouthpiece that shifts your lower jaw forward. ? A CPAP device. This device blows air through a mask when you breathe out (exhale). ? An EPAP device. This has valves that you put in each nostril. ? A BPAP device. This device blows air through a mask when you breathe in (inhale) and breathe out.  Having surgery if other treatments do not work. It is   important to get treatment for sleep apnea. Without treatment, it can lead to:  High blood pressure.  Coronary artery disease.  In men, not being able to have an erection (impotence).  Reduced thinking ability. Follow these instructions at home: Lifestyle  Make changes that your doctor recommends.  Eat a healthy diet.  Lose weight if needed.  Avoid alcohol, medicines to help you relax, and some pain  medicines.  Do not use any products that contain nicotine or tobacco, such as cigarettes, e-cigarettes, and chewing tobacco. If you need help quitting, ask your doctor. General instructions  Take over-the-counter and prescription medicines only as told by your doctor.  If you were given a machine to use while you sleep, use it only as told by your doctor.  If you are having surgery, make sure to tell your doctor you have sleep apnea. You may need to bring your device with you.  Keep all follow-up visits as told by your doctor. This is important. Contact a doctor if:  The machine that you were given to use during sleep bothers you or does not seem to be working.  You do not get better.  You get worse. Get help right away if:  Your chest hurts.  You have trouble breathing in enough air.  You have an uncomfortable feeling in your back, arms, or stomach.  You have trouble talking.  One side of your body feels weak.  A part of your face is hanging down. These symptoms may be an emergency. Do not wait to see if the symptoms will go away. Get medical help right away. Call your local emergency services (911 in the [place] Do not drive yourself to the hospital. Summary  This condition affects breathing during sleep.  The most common cause is a collapsed or blocked airway.  The goal of treatment is to help you breathe normally while you sleep. This information is not intended to replace advice given to you by your health care provider. Make sure you discuss any questions you have with your health care provider. Document Revised: 11/26/2017 Document Reviewed: 10/05/2017 [employer] Patient Education  2020 [employer]  

## 2020-02-29 NOTE — Progress Notes (Signed)
PATIENT: Thomas Rollins DOB: [birth year]  REASON FOR VISIT: follow up HISTORY FROM: patient  Chief Complaint  Patient presents with  . Follow-up    Rm 1 alone Pt is well      HISTORY OF PRESENT ILLNESS: Today 03/04/20 [name] is a 60 y.o. male here today for follow up for OSA on CPAP.  He has been on CPAP therapy for years but needed new machine. Recent sleep study confirmed severe OSA. He reports new machine is working great. He denies any concerns with machine or supplies. He has noted a leak but is uncertain if it is the mask or hose. He will continue to monitor at home.  He does report that he uses CPAP at times when he is not asleep.  Compliance report dated 02/03/2020 through 03/03/2020 reveals that he used CPAP 30 of the past 30 days for compliance of 100%.  He used CPAP greater than 4 hours all 30 days.  Average usage on days used was 13 hours and 14 minutes.  Residual AHI was 1.4 on 5 to 12 cm of water and an EPR of 2.  There was a significant leak noted in the 95th percentile at 56.9 L/min.   HISTORY: (copied from Dr Dohmeier's previous note)  Delance Johnsonis a 60  Year-old  [place] and seen upon an urgent referralon 10/23/2019 from NP Tereasa Coop, NP. I have the pleasure of seeing [name] today,a righyt-handed White or Caucasian male who has a  has a past medical history of Anxiety, Chronic pain, Depression, Diabetes mellitus without complication (HCC), Hyperlipidemia, and Hypertension. He weighted 351 pounds when he fell and became disabled.   Chiefconcernaccording to patient : Patient has been followed by Dr.Hawkins who has retired. He claims he had no follow up since his HST 01-2019. [name] reports that he under went a UPPP, about 25 years ago- He has been a CPAP user for over 2 decades and his machine is truly an antique, as he can show me here. He has nasal septal surgery as well.  He has been using a nasal  mask and underwent a home sleep test at any [facility] ordered by Dr. Juanetta Gosling.  Apparently this was an Allis testing device used on 12-9 2020.  The patient stated that then his doctor he was informed that the doctor who referred him had retired and nobody followed up.  His AHI was 6.4 which is a mild sleep apnea total number of events was 60 more events were noticed when he was on the right side sleeping or in supine position sleeping on his back. He uses hs machine even when he takes a nap.   He has been faithful use of CPAP mainly because he feels as if he suffocates and cannot really sleep without it.  He is routinely in need of a new machine.  I would like to add that the nadir of oxygen saturation was 73% during his home sleep test and that his heart rate varied very greatly between 51 and 255 bpm I am thinking that this may be a technical error.  He did snore loudly.   My nurse was able to get a download of the C-Flex machine that shows an average AHI of still 5.2/h with a CPAP setting of only 7 cmH2O 100% compliance.  [name] presented to his nurse practitioner, who is now functioning as his primary care provider recently , several days after a  tick bite and he states that he was bitten by more than 2 ticks and that he developed chills, low-grade fever and fatigue.  Mild aches all over.  He did not present with symptoms concerning for COVID-19 infection.  He is fully vaccinated.   Familymedical /sleep history:he lost his 74 year old daughter due to cancer 3 years ago, a second daughter also has cancer.  Social history: he went to school until 89 th garde. Was asingle parent , raised his second daughter alone. 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 and lives alone. Tobacco use never .Marihuana : not since spring of this year-  ETOH use seldomly , he used to take xanax and sleeping pills.  Caffeine intake  in form of Coffee( /) Soda( /) Tea ( /) or energy drinks. Chocolate milk shake 3-4 times a week.  Regular exercise - none     Sleep habits are as follows:The patient's dinner time is between 4.30 PM. The patient goes to bed at 11 PM and continues to sleep for 2-3 hours, wakes for one of two bathroom breaks.   The preferred sleep position is left, with the support of 1 pillows.  Dreams are reportedly  Frequent/vivid. Nightmares. Wakes with palpitations.  7 AM is the usual rise time. The patient wakes up spontaneously at 6.30AM.   He reports not feeling refreshed or restored in AM, with symptoms such as dry mouth,Naps are avoided.     REVIEW OF SYSTEMS: Out of a complete 14 system review of symptoms, the patient complains only of the following symptoms, depression, chronic pain and all other reviewed systems are negative.  ESS: 11 FSS: 24  ALLERGIES: Allergies  Allergen Reactions  . Codeine Other (See Comments)    Slurred Speech   . Other     [place], sweat potatoes,   . Penicillins Rash    HOME MEDICATIONS: Outpatient Medications Prior to Visit  Medication Sig Dispense Refill  . DULoxetine (CYMBALTA) 60 MG capsule Take 1 capsule (60 mg total) by mouth daily. 90 capsule 0  . gabapentin (NEURONTIN) 300 MG capsule Take 1 capsule (300 mg total) by mouth 2 (two) times daily. 90 capsule 1  . glipiZIDE (GLUCOTROL XL) 10 MG 24 hr tablet Take 1 tablet (10 mg total) by mouth daily with breakfast. 30 tablet 0  . GLOBAL EASE INJECT PEN NEEDLES 31G X 8 MM MISC 1 each by Other route as directed.    . insulin detemir (LEVEMIR) 100 UNIT/ML FlexPen Inject 30 Units into the skin 2 (two) times daily. 15 mL 11  . levothyroxine (SYNTHROID) 200 MCG tablet Take 1 tablet (200 mcg total) by mouth daily before breakfast. 30 tablet 0  . lisinopril (ZESTRIL) 10 MG tablet Take 1 tablet (10 mg total) by mouth daily. 30 tablet 2  . metFORMIN (GLUCOPHAGE) 1000 MG tablet TAKE 1 TABLET BY MOUTH TWICE DAILY FOR 30  DAYS 60 tablet 3  . morphine (MS CONTIN) 30 MG 12 hr tablet Take 1 tablet by mouth 2 (two) times daily. Take with the 60mg      . morphine (MS CONTIN) 60 MG 12 hr tablet Take 1 tablet by mouth 2 (two) times daily. Takes with the 30mg      . Omega-3 Fatty Acids (FISH OIL PO) Take 1 capsule by mouth daily.    . simvastatin (ZOCOR) 40 MG tablet TAKE 1 TABLET BY MOUTH IN THE  EVENING ONCE A DAILY EMERGENCY REFILL FAXED DR 30 tablet 3  . ALPHAGAN P 0.1 % SOLN Apply 1 drop to eye 2 (two) times daily. (Patient not taking: Reported on 03/04/2020)    . dapagliflozin propanediol (FARXIGA) 10 MG TABS tablet Take 1 tablet (10 mg total) by mouth daily. (Patient not taking: Reported on 03/04/2020) 90 tablet 0  . fluticasone (FLONASE) 50 MCG/ACT nasal spray USE ONE SPRAY in each nostril ONCE EVERY DAY (Patient not taking: Reported on 03/04/2020)  0  . lidocaine (LIDODERM) 5 % Place 1 patch onto the skin daily. Remove & Discard patch within 12 hours or as directed by MD (Patient not taking: Reported on 03/04/2020) 30 patch 0   No facility-administered medications prior to visit.    PAST MEDICAL HISTORY: Past Medical History:  Diagnosis Date  . Anxiety    panic attacks  . Chronic pain   . Depression   . Diabetes mellitus without complication (HCC)   . Encounter for hepatitis C screening test for low risk patient 04/05/2019  . Hyperlipidemia   . Hypertension     PAST SURGICAL HISTORY: Past Surgical History:  Procedure Laterality Date  . ankle sx    . ARTHROSCOPIC REPAIR ACL    . CARPAL TUNNEL RELEASE    . HERNIA REPAIR    . SHOULDER ARTHROSCOPY      FAMILY HISTORY: Family History  Problem Relation Age of Onset  . Stomach cancer Mother   . Heart Problems Father   . Dementia Father   . Cancer Father   . Bone cancer Brother   . Lung cancer Brother   . Cancer Daughter     SOCIAL HISTORY: Social History   Socioeconomic History  . Marital status: Divorced    Spouse name: Not on file  . Number  of children: 1  . Years of education: Not on file  . Highest education level: 12th grade  Occupational History  . Not on file  Tobacco Use  . Smoking status: Current Every Day Smoker  . Smokeless tobacco: Never Used  Vaping Use  . Vaping Use: Never used  Substance and Sexual Activity  . Alcohol use: Yes    Comment: occ  . Drug use: Yes    Types: Marijuana    Comment: 1-2 times a year   . Sexual activity: Never  Other Topics Concern  . 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   Takes time release morphine b/c fell 15-20 feet from a building while working.    Now disabled        Exercises/walks: 7 days a week   Has pets at home: 5 dogs in the home.      Diet: Avoids white foods, fruits, veggies, does eat some fast food/eat out    Caffeine: none only hot chocolate or soda when stomach is upset   Water: 8 cups daily if not more      Wears seat belt   Smoke detectors and carbon monoxide detectors at home    Does not use phone while driving                      Social Determinants of Health   Financial Resource Strain: Low Risk   . Difficulty of Paying Living  Expenses: Not hard at all  Food Insecurity: Food Insecurity Present  . Worried About Programme researcher, broadcasting/film/video in the Last Year: Sometimes true  . Ran Out of Food in the Last Year: Sometimes true  Transportation Needs: No Transportation Needs  . Lack of Transportation (Medical): No  . Lack of Transportation (Non-Medical): No  Physical Activity: Sufficiently Active  . Days of Exercise per Week: 7 days  . Minutes of Exercise per Session: 30 min  Stress: Stress Concern Present  . Feeling of Stress : Rather much  Social Connections: Socially Isolated  . Frequency of Communication with Friends and Family: More than three times a week  . Frequency of Social Gatherings with  Friends and Family: Three times a week  . Attends Religious Services: Never  . Active Member of Clubs or Organizations: No  . Attends Banker Meetings: Never  . Marital Status: Divorced  Catering manager Violence: Not At Risk  . Fear of Current or Ex-Partner: No  . Emotionally Abused: No  . Physically Abused: No  . Sexually Abused: No     PHYSICAL EXAM  Vitals:   03/04/20 0736  BP: 127/74  Pulse: 79  Weight: 258 lb 6.4 oz (117.2 kg)  Height: 5\' 7"  (1.702 m)   Body mass index is 40.47 kg/m.  Generalized: Well developed, in no acute distress  Cardiology: normal rate and rhythm, no murmur noted Respiratory: clear to auscultation bilaterally  Neurological examination  Mentation: Alert oriented to time, place, history taking. Follows all commands speech and language fluent Cranial nerve II-XII: Pupils were equal round reactive to light. Extraocular movements were full, visual field were full  Motor: The motor testing reveals 5 over 5 strength of all 4 extremities. Good symmetric motor tone is noted throughout.  Gait and station: Gait is normal.    DIAGNOSTIC DATA (LABS, IMAGING, TESTING) - I reviewed patient records, labs, notes, testing and imaging myself where available.  No flowsheet data found.   Lab Results  Component Value Date   WBC 7.7 01/31/2020   HGB 16.4 01/31/2020   HCT 50.6 01/31/2020   MCV 86 01/31/2020   PLT 185 01/31/2020      Component Value Date/Time   NA 137 01/31/2020 1128   K 4.3 01/31/2020 1128   CL 96 01/31/2020 1128   CO2 23 01/31/2020 1128   GLUCOSE 274 (H) 01/31/2020 1128   GLUCOSE 292 (H) 02/05/2017 1101   BUN 14 01/31/2020 1128   CREATININE 1.07 01/31/2020 1128   CREATININE 0.72 02/05/2017 1101   CALCIUM 10.1 01/31/2020 1128   PROT 7.6 01/31/2020 1128   ALBUMIN 4.8 01/31/2020 1128   AST 51 (H) 01/31/2020 1128   ALT 37 01/31/2020 1128   ALKPHOS 91 01/31/2020 1128   BILITOT 0.4 01/31/2020 1128   GFRNONAA 75 01/31/2020  1128   GFRAA 86 01/31/2020 1128   Lab Results  Component Value Date   CHOL 131 04/06/2019   HDL 45 04/06/2019   LDLCALC 67 04/06/2019   TRIG 101 04/06/2019   CHOLHDL 3.7 02/05/2017   Lab Results  Component Value Date   HGBA1C 9.9 (A) 01/31/2020   No results found for: VITAMINB12 Lab Results  Component Value Date   TSH 82.600 (H) 01/31/2020     ASSESSMENT AND PLAN 60 y.o. year old male  has a past medical history of Anxiety, Chronic pain, Depression, Diabetes mellitus without complication (HCC), Encounter for hepatitis C screening test for low risk patient (04/05/2019), Hyperlipidemia, and Hypertension.  here with     ICD-10-CM   1. OSA on CPAP  G47.33    Z99.89      Romie Raekwon Winkowski is doing well on CPAP therapy. Compliance report reveals excellent compliance.  A large leak was noted on current report.  He will monitor for correct mask seal at home.  He is currently using a nasal mask.  He will reach out to his DME company if leak continues.  May consider mask refitting.  He was encouraged to continue using CPAP nightly and for greater than 4 hours each night. We will update supply orders as indicated. Risks of untreated sleep apnea review and education materials provided. Healthy lifestyle habits encouraged. He will follow up in 1 year, sooner if needed. He verbalizes understanding and agreement with this plan.    No orders of the defined types were placed in this encounter.    No orders of the defined types were placed in this encounter.     I spent 15 minutes with the patient. 50% of this time was spent counseling and educating patient on plan of care and medications.    Debbora Presto, FNP-C 03/04/2020, 8:15 AM [facility] [address], [place], [place] [address] [phone]

## 2020-03-04 ENCOUNTER — Ambulatory Visit: Payer: Medicare HMO | Admitting: Family Medicine

## 2020-03-04 ENCOUNTER — Other Ambulatory Visit: Payer: Self-pay

## 2020-03-04 ENCOUNTER — Encounter: Payer: Self-pay | Admitting: Family Medicine

## 2020-03-04 VITALS — BP 127/74 | HR 79 | Ht 67.0 in | Wt 258.4 lb

## 2020-03-04 DIAGNOSIS — G4733 Obstructive sleep apnea (adult) (pediatric): Secondary | ICD-10-CM | POA: Diagnosis not present

## 2020-03-04 DIAGNOSIS — Z9989 Dependence on other enabling machines and devices: Secondary | ICD-10-CM

## 2020-03-07 DIAGNOSIS — I10 Essential (primary) hypertension: Secondary | ICD-10-CM | POA: Diagnosis not present

## 2020-03-07 DIAGNOSIS — E039 Hypothyroidism, unspecified: Secondary | ICD-10-CM | POA: Diagnosis not present

## 2020-03-07 DIAGNOSIS — E119 Type 2 diabetes mellitus without complications: Secondary | ICD-10-CM | POA: Diagnosis not present

## 2020-03-07 DIAGNOSIS — Z794 Long term (current) use of insulin: Secondary | ICD-10-CM | POA: Diagnosis not present

## 2020-03-08 LAB — COMPREHENSIVE METABOLIC PANEL
ALT: 49 IU/L — ABNORMAL HIGH (ref 0–44)
AST: 39 IU/L (ref 0–40)
Albumin/Globulin Ratio: 1.6 (ref 1.2–2.2)
Albumin: 4.4 g/dL (ref 3.8–4.8)
Alkaline Phosphatase: 73 IU/L (ref 44–121)
BUN/Creatinine Ratio: 18 (ref 10–24)
BUN: 15 mg/dL (ref 8–27)
Bilirubin Total: 0.4 mg/dL (ref 0.0–1.2)
CO2: 23 mmol/L (ref 20–29)
Calcium: 10 mg/dL (ref 8.6–10.2)
Chloride: 96 mmol/L (ref 96–106)
Creatinine, Ser: 0.85 mg/dL (ref 0.76–1.27)
GFR calc Af Amer: 109 mL/min/{1.73_m2} (ref >59)
GFR calc non Af Amer: 94 mL/min/{1.73_m2} (ref >59)
Globulin, Total: 2.7 g/dL (ref 1.5–4.5)
Glucose: 282 mg/dL — ABNORMAL HIGH (ref 65–99)
Potassium: 4.8 mmol/L (ref 3.5–5.2)
Sodium: 135 mmol/L (ref 134–144)
Total Protein: 7.1 g/dL (ref 6.0–8.5)

## 2020-03-08 LAB — TSH: TSH: 1.43 u[IU]/mL (ref 0.450–4.500)

## 2020-03-09 DIAGNOSIS — G4733 Obstructive sleep apnea (adult) (pediatric): Secondary | ICD-10-CM | POA: Diagnosis not present

## 2020-03-10 DIAGNOSIS — M62512 Muscle wasting and atrophy, not elsewhere classified, left shoulder: Secondary | ICD-10-CM | POA: Diagnosis not present

## 2020-03-10 DIAGNOSIS — M62511 Muscle wasting and atrophy, not elsewhere classified, right shoulder: Secondary | ICD-10-CM | POA: Diagnosis not present

## 2020-03-15 ENCOUNTER — Other Ambulatory Visit: Payer: Self-pay | Admitting: Family Medicine

## 2020-03-20 DIAGNOSIS — E1342 Other specified diabetes mellitus with diabetic polyneuropathy: Secondary | ICD-10-CM | POA: Diagnosis not present

## 2020-03-20 DIAGNOSIS — M160 Bilateral primary osteoarthritis of hip: Secondary | ICD-10-CM | POA: Diagnosis not present

## 2020-03-20 DIAGNOSIS — Z79899 Other long term (current) drug therapy: Secondary | ICD-10-CM | POA: Diagnosis not present

## 2020-03-20 DIAGNOSIS — M25511 Pain in right shoulder: Secondary | ICD-10-CM | POA: Diagnosis not present

## 2020-03-22 ENCOUNTER — Other Ambulatory Visit: Payer: Self-pay

## 2020-03-22 ENCOUNTER — Emergency Department (HOSPITAL_COMMUNITY)
Admission: EM | Admit: 2020-03-22 | Discharge: 2020-03-22 | Disposition: A | Discharge status: Home / Self Care | Payer: Medicare HMO | Attending: Emergency Medicine | Admitting: Emergency Medicine

## 2020-03-22 ENCOUNTER — Encounter (HOSPITAL_COMMUNITY): Payer: Self-pay | Admitting: *Deleted

## 2020-03-22 DIAGNOSIS — R739 Hyperglycemia, unspecified: Secondary | ICD-10-CM

## 2020-03-22 DIAGNOSIS — F17200 Nicotine dependence, unspecified, uncomplicated: Secondary | ICD-10-CM | POA: Diagnosis not present

## 2020-03-22 DIAGNOSIS — Z79899 Other long term (current) drug therapy: Secondary | ICD-10-CM | POA: Diagnosis not present

## 2020-03-22 DIAGNOSIS — E871 Hypo-osmolality and hyponatremia: Secondary | ICD-10-CM | POA: Insufficient documentation

## 2020-03-22 DIAGNOSIS — Z7984 Long term (current) use of oral hypoglycemic drugs: Secondary | ICD-10-CM | POA: Insufficient documentation

## 2020-03-22 DIAGNOSIS — M1990 Unspecified osteoarthritis, unspecified site: Secondary | ICD-10-CM | POA: Diagnosis not present

## 2020-03-22 DIAGNOSIS — E039 Hypothyroidism, unspecified: Secondary | ICD-10-CM | POA: Insufficient documentation

## 2020-03-22 DIAGNOSIS — Z794 Long term (current) use of insulin: Secondary | ICD-10-CM | POA: Diagnosis not present

## 2020-03-22 DIAGNOSIS — M069 Rheumatoid arthritis, unspecified: Secondary | ICD-10-CM | POA: Diagnosis not present

## 2020-03-22 DIAGNOSIS — I10 Essential (primary) hypertension: Secondary | ICD-10-CM | POA: Diagnosis not present

## 2020-03-22 DIAGNOSIS — E1165 Type 2 diabetes mellitus with hyperglycemia: Secondary | ICD-10-CM | POA: Insufficient documentation

## 2020-03-22 LAB — CBC WITH DIFFERENTIAL/PLATELET
Abs Immature Granulocytes: 0.03 10*3/uL (ref 0.00–0.07)
Basophils Absolute: 0.1 10*3/uL (ref 0.0–0.1)
Basophils Relative: 1 %
Eosinophils Absolute: 0.5 10*3/uL (ref 0.0–0.5)
Eosinophils Relative: 5 %
HCT: 45.6 % (ref 39.0–52.0)
Hemoglobin: 15.2 g/dL (ref 13.0–17.0)
Immature Granulocytes: 0 %
Lymphocytes Relative: 29 %
Lymphs Abs: 2.7 10*3/uL (ref 0.7–4.0)
MCH: 28.4 pg (ref 26.0–34.0)
MCHC: 33.3 g/dL (ref 30.0–36.0)
MCV: 85.2 fL (ref 80.0–100.0)
Monocytes Absolute: 0.7 10*3/uL (ref 0.1–1.0)
Monocytes Relative: 7 %
Neutro Abs: 5.2 10*3/uL (ref 1.7–7.7)
Neutrophils Relative %: 58 %
Platelets: 170 10*3/uL (ref 150–400)
RBC: 5.35 MIL/uL (ref 4.22–5.81)
RDW: 14.7 % (ref 11.5–15.5)
WBC: 9.2 10*3/uL (ref 4.0–10.5)
nRBC: 0 % (ref 0.0–0.2)

## 2020-03-22 LAB — BASIC METABOLIC PANEL
Anion gap: 7 (ref 5–15)
BUN: 18 mg/dL (ref 8–23)
CO2: 23 mmol/L (ref 22–32)
Calcium: 9.3 mg/dL (ref 8.9–10.3)
Chloride: 98 mmol/L (ref 98–111)
Creatinine, Ser: 0.73 mg/dL (ref 0.61–1.24)
GFR, Estimated: >60 mL/min (ref >60)
Glucose, Bld: 230 mg/dL — ABNORMAL HIGH (ref 70–99)
Potassium: 4 mmol/L (ref 3.5–5.1)
Sodium: 128 mmol/L — ABNORMAL LOW (ref 135–145)

## 2020-03-22 LAB — CBG MONITORING, ED: Glucose-Capillary: 256 mg/dL — ABNORMAL HIGH (ref 70–99)

## 2020-03-22 MED ORDER — SODIUM CHLORIDE 0.9 % IV BOLUS (SEPSIS)
1000.0000 mL | Freq: Once | INTRAVENOUS | Status: AC
  Administered 2020-03-22: 1000 mL via INTRAVENOUS

## 2020-03-22 MED ORDER — METHYLPREDNISOLONE SODIUM SUCC 125 MG IJ SOLR
125.0000 mg | Freq: Once | INTRAMUSCULAR | Status: AC
  Administered 2020-03-22: 125 mg via INTRAVENOUS
  Filled 2020-03-22: qty 2

## 2020-03-22 MED ORDER — HYDROMORPHONE HCL 1 MG/ML IJ SOLN
1.0000 mg | Freq: Once | INTRAMUSCULAR | Status: AC
  Administered 2020-03-22: 1 mg via INTRAVENOUS
  Filled 2020-03-22: qty 1

## 2020-03-22 NOTE — Discharge Instructions (Addendum)
Follow-up with your primary care doctor about your cream.  Go back to taking your pain medicine the way you are taking it before it was changed

## 2020-03-22 NOTE — ED Notes (Signed)
Attempted 3 times on pt for IV access, pt clenching fists or jerks when stuck. CN notified and will attempt

## 2020-03-22 NOTE — ED Provider Notes (Signed)
[facility] EMERGENCY DEPARTMENT Provider Note   CSN: [id number] Arrival date & time: 03/22/20  1403     History Chief Complaint  Patient presents with  . Hyperglycemia    Thomas Rollins is a 60 y.o. male.  Patient complains of his glucose being a little elevated.  He also has severe arthritis in all his extremities and states that his doctor changed the way he taking his pain medicine and it does not seem to be working.  He still takes the same amount but it spread over 4 times a day instead of twice a day  The history is provided by the patient and medical records.  Hyperglycemia Blood sugar level PTA:  230 Severity:  Mild Onset quality:  Sudden Timing:  Constant Progression:  Improving Chronicity:  Recurrent Associated symptoms: no abdominal pain, no chest pain and no fatigue        Past Medical History:  Diagnosis Date  . Anxiety    panic attacks  . Chronic pain   . Depression   . Diabetes mellitus without complication (HCC)   . Encounter for hepatitis C screening test for low risk patient 04/05/2019  . Hyperlipidemia   . Hypertension     Patient Active Problem List   Diagnosis Date Noted  . Sleep apnea 01/31/2020  . CPAP (continuous positive airway pressure) dependence 10/23/2019  . Opioid contract exists 10/23/2019  . Edentulous 10/23/2019  . Other insomnia 10/23/2019  . Anxiety 04/12/2019  . Panic attacks 04/12/2019  . Vitamin D deficiency 04/11/2019  . Hypothyroidism 04/11/2019  . Diabetes mellitus type 2, insulin dependent (HCC) 02/05/2017  . Depression, recurrent (HCC) 02/05/2017  . Hyperlipidemia associated with type 2 diabetes mellitus (HCC) 02/05/2017  . Essential hypertension 02/05/2017    Past Surgical History:  Procedure Laterality Date  . ankle sx    . ARTHROSCOPIC REPAIR ACL    . CARPAL TUNNEL RELEASE    . HERNIA REPAIR    . SHOULDER ARTHROSCOPY         Family History  Problem Relation Age of Onset  . Stomach cancer  Mother   . Heart Problems Father   . Dementia Father   . Cancer Father   . Bone cancer Brother   . Lung cancer Brother   . Cancer Daughter     Social History   Tobacco Use  . Smoking status: Current Every Day Smoker  . Smokeless tobacco: Never Used  Vaping Use  . Vaping Use: Never used  Substance Use Topics  . Alcohol use: Yes    Comment: occ  . Drug use: Yes    Types: Marijuana    Comment: 1-2 times a year     Home Medications Prior to Admission medications   Medication Sig Start Date End Date Taking? Authorizing Provider  ALPHAGAN P 0.1 % SOLN Apply 1 drop to eye 2 (two) times daily. Patient not taking: Reported on 03/04/2020 08/18/19   [provider]  dapagliflozin propanediol (FARXIGA) 10 MG TABS tablet Take 1 tablet (10 mg total) by mouth daily. Patient not taking: Reported on 03/04/2020 01/23/20   Freddy Finner, NP  DULoxetine (CYMBALTA) 60 MG capsule Take 1 capsule (60 mg total) by mouth daily. 12/28/19   Freddy Finner, NP  fluticasone Aleda Grana) 50 MCG/ACT nasal spray USE ONE SPRAY in each nostril ONCE EVERY DAY Patient not taking: Reported on 03/04/2020 01/18/17   [provider]  gabapentin (NEURONTIN) 300 MG capsule Take 1 capsule (300 mg total)  by mouth 2 (two) times daily. 01/30/20   Freddy Finner, NP  glipiZIDE (GLUCOTROL XL) 10 MG 24 hr tablet Take 1 tablet (10 mg total) by mouth daily with breakfast. 01/30/20   Freddy Finner, NP  GLOBAL EASE INJECT PEN NEEDLES 31G X 8 MM MISC 1 each by Other route as directed. 02/20/19   [provider]  insulin detemir (LEVEMIR) 100 UNIT/ML FlexPen Inject 30 Units into the skin 2 (two) times daily. 02/02/20   Freddy Finner, NP  levothyroxine (SYNTHROID) 200 MCG tablet Take 1 tablet (200 mcg total) by mouth daily before breakfast. 02/01/20   Freddy Finner, NP  lidocaine (LIDODERM) 5 % Place 1 patch onto the skin daily. Remove & Discard patch within 12 hours or as directed by MD Patient not taking:  Reported on 03/04/2020 09/07/19   Burgess Amor, PA-C  lisinopril (ZESTRIL) 10 MG tablet Take 1 tablet (10 mg total) by mouth daily. 01/31/20   Freddy Finner, NP  metFORMIN (GLUCOPHAGE) 1000 MG tablet TAKE 1 TABLET BY MOUTH TWICE DAILY FOR 30 DAYS 12/06/19   Kerri Perches, MD  morphine (MS CONTIN) 30 MG 12 hr tablet Take 1 tablet by mouth 2 (two) times daily. Take with the 60mg   07/01/16   [provider]  morphine (MS CONTIN) 60 MG 12 hr tablet Take 1 tablet by mouth 2 (two) times daily. Takes with the 30mg   07/01/16   [provider]  Omega-3 Fatty Acids (FISH OIL PO) Take 1 capsule by mouth daily.    [provider]  simvastatin (ZOCOR) 40 MG tablet TAKE 1 TABLET BY MOUTH IN THE EVENING ONCE A DAILY EMERGENCY REFILL FAXED DR 01/23/20   08/31/16, NP    Allergies    Codeine, Other, and Penicillins  Review of Systems   Review of Systems  Constitutional: Negative for appetite change and fatigue.  HENT: Negative for congestion, ear discharge and sinus pressure.   Eyes: Negative for discharge.  Respiratory: Negative for cough.   Cardiovascular: Negative for chest pain.  Gastrointestinal: Negative for abdominal pain and diarrhea.  Genitourinary: Negative for frequency and hematuria.  Musculoskeletal: Negative for back pain.       Arthritis pain  Skin: Negative for rash.  Neurological: Negative for seizures and headaches.  Psychiatric/Behavioral: Negative for hallucinations.    Physical Exam Updated Vital Signs BP 139/80 (BP Location: Left Arm)   Pulse 75   Temp 98.2 F (36.8 C) (Oral)   Resp 16   Ht 5\' 7"  (1.702 m)   Wt 117.9 kg   SpO2 100%   BMI 40.72 kg/m   Physical Exam Vitals reviewed.  Constitutional:      Appearance: He is well-developed.  HENT:     Head: Normocephalic.     Nose: Nose normal.  Eyes:     General: No scleral icterus.    Extraocular Movements: EOM normal.     Conjunctiva/sclera: Conjunctivae normal.  Neck:      Thyroid: No thyromegaly.  Cardiovascular:     Rate and Rhythm: Normal rate and regular rhythm.     Heart sounds: No murmur heard. No friction rub. No gallop.   Pulmonary:     Breath sounds: No stridor. No wheezing or rales.  Chest:     Chest wall: No tenderness.  Abdominal:     General: There is no distension.     Tenderness: There is no abdominal tenderness. There is no rebound.  Musculoskeletal:  General: No edema. Normal range of motion.     Cervical back: Neck supple.  Lymphadenopathy:     Cervical: No cervical adenopathy.  Skin:    Findings: No erythema or rash.  Neurological:     Mental Status: He is alert and oriented to person, place, and time.     Motor: No abnormal muscle tone.     Coordination: Coordination normal.  Psychiatric:        Mood and Affect: Mood and affect normal.        Behavior: Behavior normal.     ED Results / Procedures / Treatments   Labs (all labs ordered are listed, but only abnormal results are displayed) Labs Reviewed  BASIC METABOLIC PANEL - Abnormal; Notable for the following components:      Result Value   Sodium 128 (*)    Glucose, Bld 230 (*)    All other components within normal limits  CBG MONITORING, ED - Abnormal; Notable for the following components:   Glucose-Capillary 256 (*)    All other components within normal limits  CBC WITH DIFFERENTIAL/PLATELET    EKG None  Radiology No results found.  Procedures Procedures   Medications Ordered in ED Medications  sodium chloride 0.9 % bolus 1,000 mL (0 mLs Intravenous Stopped 03/22/20 2222)  HYDROmorphone (DILAUDID) injection 1 mg (1 mg Intravenous Given 03/22/20 2136)  methylPREDNISolone sodium succinate (SOLU-MEDROL) 125 mg/2 mL injection 125 mg (125 mg Intravenous Given 03/22/20 2216)    ED Course  I have reviewed the triage vital signs and the nursing notes.  Pertinent labs & imaging results that were available during my care of the patient were reviewed by me  and considered in my medical decision making (see chart for details).    MDM Rules/Calculators/A&P                          Patient with mild elevated glucose and hyponatremia.  He was hydrated with normal saline.  Patient also has severe arthritis and was instructed to go back to taking his pain medicine.  The way he was before because that helped.  He also was given 1 shot of steroids which has helped his arthritis before.  He will follow up with his PCP Final Clinical Impression(s) / ED Diagnoses Final diagnoses:  Hyperglycemia  Arthritis    Rx / DC Orders ED Discharge Orders    None       Bethann Berkshire, MD 03/22/20 2243

## 2020-03-22 NOTE — ED Triage Notes (Signed)
Pt with elevated blood sugar at home.  Also c/o pain all over. Pt with hx of arthritis, states his Morphine is not helping at home.

## 2020-03-25 ENCOUNTER — Telehealth: Payer: Self-pay

## 2020-03-25 NOTE — Telephone Encounter (Signed)
Transition Care Management Follow-up Telephone Call  Date of discharge and from where: 03/22/2020 from [facility]  How have you been since you were released from the hospital? Pt stated that he is feeling much better now and will call PCP for a follow up appointment and will also reach out to pain management for arthritis.   Any questions or concerns? No  Items Reviewed:  Did the pt receive and understand the discharge instructions provided? Yes   Medications obtained and verified? Yes   Other? No   Any new allergies since your discharge? No   Dietary orders reviewed? Yes  Do you have support at home? Yes   Functional Questionnaire: (I = Independent and D = Dependent) ADLs: I  Bathing/Dressing- I  Meal Prep- I  Eating- I  Maintaining continence- I  Transferring/Ambulation- I  Managing Meds- I  Follow up appointments reviewed:   PCP Hospital f/u appt confirmed? No  Pt will call for follow up appointment.   Specialist Hospital f/u appt confirmed? No  Pt will call for follow up appointment.   Are transportation arrangements needed? No   If their condition worsens, is the pt aware to call PCP or go to the Emergency Dept.? Yes  Was the patient provided with contact information for the PCP's office or ED? Yes  Was to pt encouraged to call back with questions or concerns? Yes

## 2020-03-26 ENCOUNTER — Other Ambulatory Visit: Payer: Self-pay | Admitting: Family Medicine

## 2020-03-26 DIAGNOSIS — M792 Neuralgia and neuritis, unspecified: Secondary | ICD-10-CM

## 2020-03-27 ENCOUNTER — Ambulatory Visit (INDEPENDENT_AMBULATORY_CARE_PROVIDER_SITE_OTHER): Payer: Medicare HMO | Admitting: Nurse Practitioner

## 2020-03-27 ENCOUNTER — Other Ambulatory Visit: Payer: Self-pay

## 2020-03-27 ENCOUNTER — Encounter: Payer: Self-pay | Admitting: Nurse Practitioner

## 2020-03-27 DIAGNOSIS — E119 Type 2 diabetes mellitus without complications: Secondary | ICD-10-CM

## 2020-03-27 DIAGNOSIS — Z794 Long term (current) use of insulin: Secondary | ICD-10-CM

## 2020-03-27 DIAGNOSIS — M1250 Traumatic arthropathy, unspecified site: Secondary | ICD-10-CM | POA: Diagnosis not present

## 2020-03-27 MED ORDER — MELOXICAM 7.5 MG PO TABS: 7.5000 mg | ORAL_TABLET | Freq: Every day | ORAL | 0 refills | Status: DC

## 2020-03-27 MED ORDER — BLOOD GLUCOSE METER KIT: PACK | 0 refills | Status: AC

## 2020-03-27 NOTE — Progress Notes (Signed)
Acute Office Visit  Subjective:    Patient ID: Thomas Rollins, male    DOB: 1960/03/11, 60 y.o.   MRN: [id number]  Chief Complaint  Patient presents with  . Arthritis    Hx of arthritis and chronic pain due to injury in 2004.   [name] Hyperglycemia    HPI Patient is in today for acute visit for hyperglycemia and arthritis pain.  He was seen in the ED on 03/22/20 for hyperglycemia and arthritis pain in all of his extremities.  He states that his medication was changed from BID to QID, although the total dosage remained the same.  He is followed by Sonny Masters with pain mgmt in [place], [place].  It appears that his gabapentin was recently changed from TID to BID.  He states that he got a steroid injection and an IV pain medication, and that helped him a lot.  He states his blood sugar has been elevated.  He checks this 3 times per day.  His numbers have been elevated in the 200-300s, but he does not trust his meter.  Past Medical History:  Diagnosis Date  . Anxiety    panic attacks  . Chronic pain   . Depression   . Diabetes mellitus without complication ([place])   . Encounter for hepatitis C screening test for low risk patient 04/05/2019  . Hyperlipidemia   . Hypertension     Past Surgical History:  Procedure Laterality Date  . ankle sx    . ARTHROSCOPIC REPAIR ACL    . CARPAL TUNNEL RELEASE    . HERNIA REPAIR    . SHOULDER ARTHROSCOPY      Family History  Problem Relation Age of Onset  . Stomach cancer Mother   . Heart Problems Father   . Dementia Father   . Cancer Father   . Bone cancer Brother   . Lung cancer Brother   . Cancer Daughter     Social History   Socioeconomic History  . Marital status: Divorced    Spouse name: Not on file  . Number of children: 1  . Years of education: Not on file  . Highest education level: 12th grade  Occupational History  . Not on file  Tobacco Use  . Smoking status: Former Smoker    Types: Cigarettes  .  Smokeless tobacco: Never Used  Vaping Use  . Vaping Use: Never used  Substance and Sexual Activity  . Alcohol use: Yes    Comment: occ  . Drug use: Yes    Types: Marijuana    Comment: 1-2 times a year   . Sexual activity: Never  Other Topics Concern  . 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   Takes time release morphine b/c fell 15-20 feet from a building while working.    Now disabled        Exercises/walks: 7 days a week   Has pets at home: 5 dogs in the home.      Diet: Avoids white foods, fruits, veggies, does eat some fast food/eat out    Caffeine: none only hot chocolate or soda when stomach is upset   Water: 8 cups daily if not more      Wears seat belt   Smoke detectors and carbon monoxide detectors  at home    Does not use phone while driving                      Social Determinants of Health   Financial Resource Strain: Low Risk   . Difficulty of Paying Living Expenses: Not hard at all  Food Insecurity: Food Insecurity Present  . Worried About Charity fundraiser in the Last Year: Sometimes true  . Ran Out of Food in the Last Year: Sometimes true  Transportation Needs: No Transportation Needs  . Lack of Transportation (Medical): No  . Lack of Transportation (Non-Medical): No  Physical Activity: Sufficiently Active  . Days of Exercise per Week: 7 days  . Minutes of Exercise per Session: 30 min  Stress: Stress Concern Present  . Feeling of Stress : Rather much  Social Connections: Socially Isolated  . Frequency of Communication with Friends and Family: More than three times a week  . Frequency of Social Gatherings with Friends and Family: Three times a week  . Attends Religious Services: Never  . Active Member of Clubs or Organizations: No  . Attends Archivist Meetings: Never  . Marital Status:  Divorced  Human resources officer Violence: Not At Risk  . Fear of Current or Ex-Partner: No  . Emotionally Abused: No  . Physically Abused: No  . Sexually Abused: No    Outpatient Medications Prior to Visit  Medication Sig Dispense Refill  . ALPHAGAN P 0.1 % SOLN Apply 1 drop to eye 2 (two) times daily.    . dapagliflozin propanediol (FARXIGA) 10 MG TABS tablet Take 1 tablet (10 mg total) by mouth daily. 90 tablet 0  . DULoxetine (CYMBALTA) 60 MG capsule TAKE 1 CAPSULE BY MOUTH EVERY DAY 90 capsule 0  . fluticasone (FLONASE) 50 MCG/ACT nasal spray   0  . gabapentin (NEURONTIN) 300 MG capsule TAKE ONE CAPSULE BY MOUTH TWICE DAILY 60 capsule 2  . glipiZIDE (GLUCOTROL XL) 10 MG 24 hr tablet TAKE 1 TABLET BY MOUTH DAILY WITH BREAKFAST 30 tablet 0  . GLOBAL EASE INJECT PEN NEEDLES 31G X 8 MM MISC 1 each by Other route as directed.    . insulin detemir (LEVEMIR) 100 UNIT/ML FlexPen Inject 30 Units into the skin 2 (two) times daily. 15 mL 11  . levothyroxine (SYNTHROID) 125 MCG tablet TAKE 1 TABLET BY MOUTH EVERY MORNING ON AN EMPTY STOMACH 30 tablet 2  . levothyroxine (SYNTHROID) 200 MCG tablet Take 1 tablet (200 mcg total) by mouth daily before breakfast. 30 tablet 0  . lidocaine (LIDODERM) 5 % Place 1 patch onto the skin daily. Remove & Discard patch within 12 hours or as directed by MD 30 patch 0  . lisinopril (ZESTRIL) 10 MG tablet Take 1 tablet (10 mg total) by mouth daily. 30 tablet 2  . metFORMIN (GLUCOPHAGE) 1000 MG tablet TAKE 1 TABLET BY MOUTH TWICE DAILY FOR 30 DAYS 60 tablet 3  . morphine (MS CONTIN) 30 MG 12 hr tablet Take 1 tablet by mouth 2 (two) times daily. Take with the 37m     . morphine (MS CONTIN) 60 MG 12 hr tablet Take 1 tablet by mouth 2 (two) times daily. Takes with the 36m    . Omega-3 Fatty Acids (FISH OIL PO) Take 1 capsule by mouth daily.    . simvastatin (ZOCOR) 40 MG tablet TAKE 1 TABLET BY MOUTH IN THE EVENING ONCE A DAILY EMERGENCY REFILL FAXED DR 30 tablet 3  No  facility-administered medications prior to visit.    Allergies  Allergen Reactions  . Codeine Other (See Comments)    Slurred Speech   . Other     [place], sweat potatoes,   . Penicillins Rash    Review of Systems  Constitutional: Negative.   Respiratory: Negative.   Cardiovascular: Negative.   Endocrine: Positive for polydipsia.  Musculoskeletal: Positive for arthralgias.       Objective:    Physical Exam Constitutional:      Appearance: He is obese.  Musculoskeletal:        General: Deformity present.     Comments: Hx of trauma to hips; fell from 20 ft onto concrete  Neurological:     Mental Status: He is alert.     BP (!) 150/80   Pulse 76   Temp 98.6 F (37 C)   Resp 20   Ht 5' 8.5" (1.74 m)   Wt 252 lb (114.3 kg)   SpO2 96%   BMI 37.76 kg/m  Wt Readings from Last 3 Encounters:  03/27/20 252 lb (114.3 kg)  03/22/20 260 lb (117.9 kg)  03/04/20 258 lb 6.4 oz (117.2 kg)    Health Maintenance Due  Topic Date Due  . PNEUMOCOCCAL POLYSACCHARIDE VACCINE AGE 19-64 HIGH RISK  Never done  . OPHTHALMOLOGY EXAM  Never done  . COLONOSCOPY (Pts 45-65yr Insurance coverage will need to be confirmed)  Never done    There are no preventive care reminders to display for this patient.   Lab Results  Component Value Date   TSH 1.430 03/07/2020   Lab Results  Component Value Date   WBC 9.2 03/22/2020   HGB 15.2 03/22/2020   HCT 45.6 03/22/2020   MCV 85.2 03/22/2020   PLT 170 03/22/2020   Lab Results  Component Value Date   NA 128 (L) 03/22/2020   K 4.0 03/22/2020   CO2 23 03/22/2020   GLUCOSE 230 (H) 03/22/2020   BUN 18 03/22/2020   CREATININE 0.73 03/22/2020   BILITOT 0.4 03/07/2020   ALKPHOS 73 03/07/2020   AST 39 03/07/2020   ALT 49 (H) 03/07/2020   PROT 7.1 03/07/2020   ALBUMIN 4.4 03/07/2020   CALCIUM 9.3 03/22/2020   ANIONGAP 7 03/22/2020   Lab Results  Component Value Date   CHOL 131 04/06/2019   Lab Results  Component Value Date    HDL 45 04/06/2019   Lab Results  Component Value Date   LDLCALC 67 04/06/2019   Lab Results  Component Value Date   TRIG 101 04/06/2019   Lab Results  Component Value Date   CHOLHDL 3.7 02/05/2017   Lab Results  Component Value Date   HGBA1C 9.9 (A) 01/31/2020       Assessment & Plan:   Problem List Items Addressed This Visit      Endocrine   Diabetes mellitus type 2, insulin dependent ([place]    -check CBG and A1c today -Rx. Meter and DM supplies; he thinks his current one is not correctly calibrated bc it was off by 200 points from hospital meter -poor dietary compliance -he refused endocrinology d/t copay      Relevant Orders   CBC with Differential/Platelet   CMP14+EGFR   Hemoglobin A1c   Lipid Panel With LDL/HDL Ratio     Musculoskeletal and Integument   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    -hx of hip fx after falling 20 ft onto concrete -Rx. meloxicam -narcotics [place] pain mgmt [place]  [place], [place] ordered this encounter  Medications  . blood glucose meter kit and supplies    Sig: Dispense based on patient and insurance preference. Use up to four times daily as directed. (FOR ICD-10 E10.9, E11.9).    Dispense:  1 each    Refill:  0    E11.65; 30-day supply plus meter    Order Specific Question:   Number of strips    Answer:   100    Order Specific Question:   Number of lancets    Answer:   100  . meloxicam (MOBIC) 7.5 MG tablet    Sig: Take 1 tablet (7.5 mg total) by mouth daily.    Dispense:  30 tablet    Refill:  0   Time spent 35 min  Noreene Larsson, NP

## 2020-03-27 NOTE — Assessment & Plan Note (Addendum)
-  check CBG and A1c today -Rx. Meter and DM supplies; he thinks his current one is not correctly calibrated bc it was off by 200 points from hospital meter -poor dietary compliance -he refused endocrinology d/t copay

## 2020-03-27 NOTE — Assessment & Plan Note (Signed)
-  hx of hip fx after falling 20 ft onto concrete -Rx. meloxicam -narcotics managed by pain mgmt in [place], [place]

## 2020-03-27 NOTE — Patient Instructions (Signed)
We will draw labs today.  We will follow-up in 1 week to make changes to your diabetic regimen.

## 2020-03-28 LAB — CBC WITH DIFFERENTIAL/PLATELET
Basophils Absolute: 0.1 10*3/uL (ref 0.0–0.2)
Basos: 1 %
EOS (ABSOLUTE): 0.6 10*3/uL — ABNORMAL HIGH (ref 0.0–0.4)
Eos: 7 %
Hematocrit: 46.1 % (ref 37.5–51.0)
Hemoglobin: 15.6 g/dL (ref 13.0–17.7)
Immature Grans (Abs): 0 10*3/uL (ref 0.0–0.1)
Immature Granulocytes: 0 %
Lymphocytes Absolute: 2.5 10*3/uL (ref 0.7–3.1)
Lymphs: 31 %
MCH: 28 pg (ref 26.6–33.0)
MCHC: 33.8 g/dL (ref 31.5–35.7)
MCV: 83 fL (ref 79–97)
Monocytes Absolute: 0.7 10*3/uL (ref 0.1–0.9)
Monocytes: 8 %
Neutrophils Absolute: 4.3 10*3/uL (ref 1.4–7.0)
Neutrophils: 53 %
Platelets: 206 10*3/uL (ref 150–450)
RBC: 5.57 x10E6/uL (ref 4.14–5.80)
RDW: 14.8 % (ref 11.6–15.4)
WBC: 8.1 10*3/uL (ref 3.4–10.8)

## 2020-03-28 LAB — CMP14+EGFR
ALT: 47 IU/L — ABNORMAL HIGH (ref 0–44)
AST: 30 IU/L (ref 0–40)
Albumin/Globulin Ratio: 2.1 (ref 1.2–2.2)
Albumin: 4.8 g/dL (ref 3.8–4.8)
Alkaline Phosphatase: 72 IU/L (ref 44–121)
BUN/Creatinine Ratio: 21 (ref 10–24)
BUN: 16 mg/dL (ref 8–27)
Bilirubin Total: 0.3 mg/dL (ref 0.0–1.2)
CO2: 23 mmol/L (ref 20–29)
Calcium: 9.9 mg/dL (ref 8.6–10.2)
Chloride: 97 mmol/L (ref 96–106)
Creatinine, Ser: 0.75 mg/dL — ABNORMAL LOW (ref 0.76–1.27)
GFR calc Af Amer: 114 mL/min/{1.73_m2} (ref >59)
GFR calc non Af Amer: 99 mL/min/{1.73_m2} (ref >59)
Globulin, Total: 2.3 g/dL (ref 1.5–4.5)
Glucose: 213 mg/dL — ABNORMAL HIGH (ref 65–99)
Potassium: 4.5 mmol/L (ref 3.5–5.2)
Sodium: 137 mmol/L (ref 134–144)
Total Protein: 7.1 g/dL (ref 6.0–8.5)

## 2020-03-28 LAB — LIPID PANEL WITH LDL/HDL RATIO
Cholesterol, Total: 150 mg/dL (ref 100–199)
HDL: 42 mg/dL (ref >39)
LDL Chol Calc (NIH): 78 mg/dL (ref 0–99)
LDL/HDL Ratio: 1.9 ratio (ref 0.0–3.6)
Triglycerides: 176 mg/dL — ABNORMAL HIGH (ref 0–149)
VLDL Cholesterol Cal: 30 mg/dL (ref 5–40)

## 2020-03-28 LAB — HEMOGLOBIN A1C
Est. average glucose Bld gHb Est-mCnc: 266 mg/dL
Hgb A1c MFr Bld: 10.9 % — ABNORMAL HIGH (ref 4.8–5.6)

## 2020-03-28 NOTE — Progress Notes (Signed)
His A1c was 10.9 and triglycerides were elevated as well. Triglycerides will go up when blood sugar is not well controlled.  This A1c gives me a better idea of how to treat his blood sugar, and we will discuss this at his appointment in a week.

## 2020-04-03 ENCOUNTER — Encounter: Payer: Self-pay | Admitting: Nurse Practitioner

## 2020-04-03 ENCOUNTER — Other Ambulatory Visit: Payer: Self-pay

## 2020-04-03 ENCOUNTER — Ambulatory Visit (INDEPENDENT_AMBULATORY_CARE_PROVIDER_SITE_OTHER): Payer: Medicare HMO | Admitting: Nurse Practitioner

## 2020-04-03 DIAGNOSIS — E785 Hyperlipidemia, unspecified: Secondary | ICD-10-CM

## 2020-04-03 DIAGNOSIS — I10 Essential (primary) hypertension: Secondary | ICD-10-CM | POA: Diagnosis not present

## 2020-04-03 DIAGNOSIS — E1169 Type 2 diabetes mellitus with other specified complication: Secondary | ICD-10-CM | POA: Diagnosis not present

## 2020-04-03 DIAGNOSIS — Z794 Long term (current) use of insulin: Secondary | ICD-10-CM | POA: Diagnosis not present

## 2020-04-03 DIAGNOSIS — E119 Type 2 diabetes mellitus without complications: Secondary | ICD-10-CM

## 2020-04-03 MED ORDER — FISH OIL 1000 MG PO CAPS: 2.0000 | ORAL_CAPSULE | Freq: Two times a day (BID) | ORAL | 3 refills | Status: DC

## 2020-04-03 MED ORDER — INSULIN ASPART 100 UNIT/ML FLEXPEN: 0.0000 [IU] | PEN_INJECTOR | Freq: Three times a day (TID) | SUBCUTANEOUS | 1 refills | Status: DC

## 2020-04-03 NOTE — Assessment & Plan Note (Signed)
-  BP well controlled today -no change in medication

## 2020-04-03 NOTE — Progress Notes (Signed)
Established Patient Office Visit  Subjective:  Patient ID: Thomas Rollins, male    DOB: [birth year]  Age: 60 y.o. MRN: [id number]  CC:  Chief Complaint  Patient presents with  . Follow-up    Diabetes and med check     HPI Thomas Rollins presents for lab follow-up for diabetes.  His trigs and A1c were elevated on his labs. His home blood sugar has been high at home.  Past Medical History:  Diagnosis Date  . Anxiety    panic attacks  . Chronic pain   . Depression   . Diabetes mellitus without complication ([place])   . Encounter for hepatitis C screening test for low risk patient 04/05/2019  . Hyperlipidemia   . Hypertension     Past Surgical History:  Procedure Laterality Date  . ankle sx    . ARTHROSCOPIC REPAIR ACL    . CARPAL TUNNEL RELEASE    . HERNIA REPAIR    . SHOULDER ARTHROSCOPY      Family History  Problem Relation Age of Onset  . Stomach cancer Mother   . Heart Problems Father   . Dementia Father   . Cancer Father   . Bone cancer Brother   . Lung cancer Brother   . Cancer Daughter     Social History   Socioeconomic History  . Marital status: Divorced    Spouse name: Not on file  . Number of children: 1  . Years of education: Not on file  . Highest education level: 12th grade  Occupational History  . Not on file  Tobacco Use  . Smoking status: Former Smoker    Types: Cigarettes  . Smokeless tobacco: Never Used  Vaping Use  . Vaping Use: Never used  Substance and Sexual Activity  . Alcohol use: Yes    Comment: occ  . Drug use: Yes    Types: Marijuana    Comment: 1-2 times a year   . Sexual activity: Never  Other Topics Concern  . 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   Takes time release morphine b/c fell 15-20 feet from a building while working.    Now disabled         Exercises/walks: 7 days a week   Has pets at home: 5 dogs in the home.      Diet: Avoids white foods, fruits, veggies, does eat some fast food/eat out    Caffeine: none only hot chocolate or soda when stomach is upset   Water: 8 cups daily if not more      Wears seat belt   Smoke detectors and carbon monoxide detectors at home    Does not use phone while driving                      Social Determinants of Health   Financial Resource Strain: Low Risk   . Difficulty of Paying Living Expenses: Not hard at all  Food Insecurity: Food Insecurity Present  . Worried About Charity fundraiser in the Last Year: Sometimes true  . Ran Out of Food in the Last Year: Sometimes true  Transportation Needs: No Transportation Needs  . Lack of Transportation (Medical): No  . Lack of Transportation (Non-Medical): No  Physical Activity: Sufficiently  Active  . Days of Exercise per Week: 7 days  . Minutes of Exercise per Session: 30 min  Stress: Stress Concern Present  . Feeling of Stress : Rather much  Social Connections: Socially Isolated  . Frequency of Communication with Friends and Family: More than three times a week  . Frequency of Social Gatherings with Friends and Family: Three times a week  . Attends Religious Services: Never  . Active Member of Clubs or Organizations: No  . Attends Archivist Meetings: Never  . Marital Status: Divorced  Human resources officer Violence: Not At Risk  . Fear of Current or Ex-Partner: No  . Emotionally Abused: No  . Physically Abused: No  . Sexually Abused: No    Outpatient Medications Prior to Visit  Medication Sig Dispense Refill  . ALPHAGAN P 0.1 % SOLN Apply 1 drop to eye 2 (two) times daily.    . blood glucose meter kit and supplies Dispense based on patient and insurance preference. Use up to four times daily as directed. (FOR ICD-10 E10.9, E11.9). 1 each 0  . DULoxetine (CYMBALTA) 60 MG capsule TAKE 1 CAPSULE BY MOUTH EVERY DAY 90 capsule  0  . fluticasone (FLONASE) 50 MCG/ACT nasal spray   0  . gabapentin (NEURONTIN) 300 MG capsule TAKE ONE CAPSULE BY MOUTH TWICE DAILY 60 capsule 2  . glipiZIDE (GLUCOTROL XL) 10 MG 24 hr tablet TAKE 1 TABLET BY MOUTH DAILY WITH BREAKFAST 30 tablet 0  . GLOBAL EASE INJECT PEN NEEDLES 31G X 8 MM MISC 1 each by Other route as directed.    . insulin detemir (LEVEMIR) 100 UNIT/ML FlexPen Inject 30 Units into the skin 2 (two) times daily. 15 mL 11  . levothyroxine (SYNTHROID) 125 MCG tablet TAKE 1 TABLET BY MOUTH EVERY MORNING ON AN EMPTY STOMACH 30 tablet 2  . levothyroxine (SYNTHROID) 200 MCG tablet Take 1 tablet (200 mcg total) by mouth daily before breakfast. 30 tablet 0  . lidocaine (LIDODERM) 5 % Place 1 patch onto the skin daily. Remove & Discard patch within 12 hours or as directed by MD 30 patch 0  . lisinopril (ZESTRIL) 10 MG tablet Take 1 tablet (10 mg total) by mouth daily. 30 tablet 2  . meloxicam (MOBIC) 7.5 MG tablet Take 1 tablet (7.5 mg total) by mouth daily. 30 tablet 0  . metFORMIN (GLUCOPHAGE) 1000 MG tablet TAKE 1 TABLET BY MOUTH TWICE DAILY FOR 30 DAYS 60 tablet 3  . morphine (MS CONTIN) 30 MG 12 hr tablet Take 1 tablet by mouth 2 (two) times daily. Take with the $Remove'60mg'yKToCvA$      . morphine (MS CONTIN) 60 MG 12 hr tablet Take 1 tablet by mouth 2 (two) times daily. Takes with the $Remove'30mg'pDNSldT$      . simvastatin (ZOCOR) 40 MG tablet TAKE 1 TABLET BY MOUTH IN THE EVENING ONCE A DAILY EMERGENCY REFILL FAXED DR 30 tablet 3  . dapagliflozin propanediol (FARXIGA) 10 MG TABS tablet Take 1 tablet (10 mg total) by mouth daily. 90 tablet 0  . Omega-3 Fatty Acids (FISH OIL PO) Take 1 capsule by mouth daily.     No facility-administered medications prior to visit.    Allergies  Allergen Reactions  . Codeine Other (See Comments)    Slurred Speech   . Other     [place], sweat potatoes,   . Penicillins Rash    ROS Review of Systems  Constitutional: Negative.   Respiratory: Negative.   Cardiovascular:  Negative.   Endocrine:  Negative.       Objective:    Physical Exam Constitutional:      Appearance: Normal appearance.  Cardiovascular:     Rate and Rhythm: Normal rate and regular rhythm.     Pulses: Normal pulses.     Heart sounds: Normal heart sounds.  Pulmonary:     Effort: Pulmonary effort is normal.     Breath sounds: Normal breath sounds.  Neurological:     Mental Status: He is alert.     BP 136/80   Pulse 80   Temp 98.6 F (37 C)   Resp 20   Ht $R'5\' 8"'uW$  (1.727 m)   Wt 252 lb (114.3 kg)   SpO2 94%   BMI 38.32 kg/m  Wt Readings from Last 3 Encounters:  04/03/20 252 lb (114.3 kg)  03/27/20 252 lb (114.3 kg)  03/22/20 260 lb (117.9 kg)     Health Maintenance Due  Topic Date Due  . PNEUMOCOCCAL POLYSACCHARIDE VACCINE AGE 32-64 HIGH RISK  Never done  . OPHTHALMOLOGY EXAM  Never done  . COLONOSCOPY (Pts 45-48yrs Insurance coverage will need to be confirmed)  Never done    There are no preventive care reminders to display for this patient.  Lab Results  Component Value Date   TSH 1.430 03/07/2020   Lab Results  Component Value Date   WBC 8.1 03/27/2020   HGB 15.6 03/27/2020   HCT 46.1 03/27/2020   MCV 83 03/27/2020   PLT 206 03/27/2020   Lab Results  Component Value Date   NA 137 03/27/2020   K 4.5 03/27/2020   CO2 23 03/27/2020   GLUCOSE 213 (H) 03/27/2020   BUN 16 03/27/2020   CREATININE 0.75 (L) 03/27/2020   BILITOT 0.3 03/27/2020   ALKPHOS 72 03/27/2020   AST 30 03/27/2020   ALT 47 (H) 03/27/2020   PROT 7.1 03/27/2020   ALBUMIN 4.8 03/27/2020   CALCIUM 9.9 03/27/2020   ANIONGAP 7 03/22/2020   Lab Results  Component Value Date   CHOL 150 03/27/2020   Lab Results  Component Value Date   HDL 42 03/27/2020   Lab Results  Component Value Date   LDLCALC 78 03/27/2020   Lab Results  Component Value Date   TRIG 176 (H) 03/27/2020   Lab Results  Component Value Date   CHOLHDL 3.7 02/05/2017   Lab Results  Component Value Date    HGBA1C 10.9 (H) 03/27/2020      Assessment & Plan:   Problem List Items Addressed This Visit      Cardiovascular and Mediastinum   Essential hypertension    -BP well controlled today -no change in medication        Endocrine   Diabetes mellitus type 2, insulin dependent ([place])    Lab Results  Component Value Date   HGBA1C 10.9 (H) 03/27/2020   -poor dietary compliance -he refused endocrinology d/t copay -taking metformin 1000 mg BID -Rx. novolog per high dose sliding scale -on lisinopril and simvastatin -STOP glipizide -he has not been taking [place]      Relevant Medications   insulin aspart (NOVOLOG) 100 UNIT/ML FlexPen   Hyperlipidemia associated with type 2 diabetes mellitus ([place])    Lab Results  Component Value Date   CHOL 150 03/27/2020   HDL 42 03/27/2020   LDLCALC 78 03/27/2020   TRIG 176 (H) 03/27/2020   CHOLHDL 3.7 02/05/2017   -Rx. Fish oil 2 g PO BID -trigs will likely go down with blood glucose -  Continue simvastatin      Relevant Medications   insulin aspart (NOVOLOG) 100 UNIT/ML FlexPen      Meds ordered this encounter  Medications  . insulin aspart (NOVOLOG) 100 UNIT/ML FlexPen    Sig: Inject 0-14 Units into the skin 3 (three) times daily with meals.    Dispense:  15 mL    Refill:  1  . Omega-3 Fatty Acids (FISH OIL) 1000 MG CAPS    Sig: Take 2 capsules (2,000 mg total) by mouth in the morning and at bedtime.    Dispense:  360 capsule    Refill:  3    Follow-up: Return in about 2 weeks (around 04/17/2020) for Blood sugar check.  Time spent: 30 min  Noreene Larsson, NP

## 2020-04-03 NOTE — Assessment & Plan Note (Addendum)
Lab Results  Component Value Date   CHOL 150 03/27/2020   HDL 42 03/27/2020   LDLCALC 78 03/27/2020   TRIG 176 (H) 03/27/2020   CHOLHDL 3.7 02/05/2017   -Rx. Fish oil 2 g PO BID -trigs will likely go down with blood glucose -Continue simvastatin

## 2020-04-03 NOTE — Assessment & Plan Note (Addendum)
Lab Results  Component Value Date   HGBA1C 10.9 (H) 03/27/2020   -poor dietary compliance -he refused endocrinology d/t copay -taking metformin 1000 mg BID -Rx. novolog per high dose sliding scale -on lisinopril and simvastatin -STOP glipizide -he has not been taking [place]

## 2020-04-04 ENCOUNTER — Other Ambulatory Visit: Payer: Self-pay | Admitting: Family Medicine

## 2020-04-09 DIAGNOSIS — G4733 Obstructive sleep apnea (adult) (pediatric): Secondary | ICD-10-CM | POA: Diagnosis not present

## 2020-04-15 ENCOUNTER — Other Ambulatory Visit: Payer: Self-pay | Admitting: Family Medicine

## 2020-04-15 DIAGNOSIS — I10 Essential (primary) hypertension: Secondary | ICD-10-CM

## 2020-04-17 ENCOUNTER — Encounter: Payer: Self-pay | Admitting: Nurse Practitioner

## 2020-04-17 ENCOUNTER — Ambulatory Visit (INDEPENDENT_AMBULATORY_CARE_PROVIDER_SITE_OTHER): Payer: Medicare HMO | Admitting: Nurse Practitioner

## 2020-04-17 ENCOUNTER — Other Ambulatory Visit: Payer: Self-pay

## 2020-04-17 VITALS — BP 125/80 | HR 80 | Temp 98.5°F | Resp 20 | Ht 67.0 in | Wt 249.0 lb

## 2020-04-17 DIAGNOSIS — E119 Type 2 diabetes mellitus without complications: Secondary | ICD-10-CM | POA: Diagnosis not present

## 2020-04-17 DIAGNOSIS — Z794 Long term (current) use of insulin: Secondary | ICD-10-CM | POA: Diagnosis not present

## 2020-04-17 DIAGNOSIS — Z0000 Encounter for general adult medical examination without abnormal findings: Secondary | ICD-10-CM | POA: Diagnosis not present

## 2020-04-17 MED ORDER — INSULIN ASPART 100 UNIT/ML FLEXPEN: 0.0000 [IU] | PEN_INJECTOR | Freq: Three times a day (TID) | SUBCUTANEOUS | 1 refills | Status: DC

## 2020-04-17 MED ORDER — INSULIN DETEMIR 100 UNIT/ML FLEXPEN: 35.0000 [IU] | PEN_INJECTOR | Freq: Two times a day (BID) | SUBCUTANEOUS | 11 refills | Status: DC

## 2020-04-17 NOTE — Progress Notes (Signed)
Established Patient Office Visit  Subjective:  Patient ID: Thomas Rollins, male    DOB: 12/06/1960  Age: 60 y.o. MRN: [id number]  CC:  Chief Complaint  Patient presents with  . Diabetes    Blood sugar follow up    HPI Thomas Rollins presents for blood sugar check. He was seen in-office on 04/03/20, and at that time he was started on novolog per sliding scale. He was instructed to bring blood sugar logs with him to this visit. He was started on high dose sliding scale, and his blood sugars have been between 189 and 336 with most around 220.  Past Medical History:  Diagnosis Date  . Anxiety    panic attacks  . Chronic pain   . Depression   . Diabetes mellitus without complication ([place])   . Encounter for hepatitis C screening test for low risk patient 04/05/2019  . Hyperlipidemia   . Hypertension     Past Surgical History:  Procedure Laterality Date  . ankle sx    . ARTHROSCOPIC REPAIR ACL    . CARPAL TUNNEL RELEASE    . HERNIA REPAIR    . SHOULDER ARTHROSCOPY      Family History  Problem Relation Age of Onset  . Stomach cancer Mother   . Heart Problems Father   . Dementia Father   . Cancer Father   . Bone cancer Brother   . Lung cancer Brother   . Cancer Daughter     Social History   Socioeconomic History  . Marital status: Divorced    Spouse name: Not on file  . Number of children: 1  . Years of education: Not on file  . Highest education level: 12th grade  Occupational History  . Not on file  Tobacco Use  . Smoking status: Former Smoker    Types: Cigarettes  . Smokeless tobacco: Never Used  Vaping Use  . Vaping Use: Never used  Substance and Sexual Activity  . Alcohol use: Yes    Comment: occ  . Drug use: Yes    Types: Marijuana    Comment: 1-2 times a year   . Sexual activity: Never  Other Topics Concern  . 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   Takes time release morphine b/c fell 15-20 feet from a building while working.    Now disabled        Exercises/walks: 7 days a week   Has pets at home: 5 dogs in the home.      Diet: Avoids white foods, fruits, veggies, does eat some fast food/eat out    Caffeine: none only hot chocolate or soda when stomach is upset   Water: 8 cups daily if not more      Wears seat belt   Smoke detectors and carbon monoxide detectors at home    Does not use phone while driving                      Social Determinants of Health   Financial Resource Strain: Not on file  Food Insecurity: Not on file  Transportation Needs: Not on file  Physical Activity: Not on file  Stress: Not on file  Social Connections: Not on file  Intimate Partner Violence: Not on  file    Outpatient Medications Prior to Visit  Medication Sig Dispense Refill  . ALPHAGAN P 0.1 % SOLN Apply 1 drop to eye 2 (two) times daily.    . blood glucose meter kit and supplies Dispense based on patient and insurance preference. Use up to four times daily as directed. (FOR ICD-10 E10.9, E11.9). 1 each 0  . DULoxetine (CYMBALTA) 60 MG capsule TAKE 1 CAPSULE BY MOUTH EVERY DAY 90 capsule 0  . fluticasone (FLONASE) 50 MCG/ACT nasal spray   0  . gabapentin (NEURONTIN) 300 MG capsule TAKE ONE CAPSULE BY MOUTH TWICE DAILY 60 capsule 2  . glipiZIDE (GLUCOTROL XL) 10 MG 24 hr tablet TAKE 1 TABLET BY MOUTH DAILY WITH BREAKFAST 30 tablet 0  . GLOBAL EASE INJECT PEN NEEDLES 31G X 8 MM MISC 1 each by Other route as directed.    [name] levothyroxine (SYNTHROID) 125 MCG tablet TAKE 1 TABLET BY MOUTH EVERY MORNING ON AN EMPTY STOMACH 30 tablet 2  . levothyroxine (SYNTHROID) 200 MCG tablet Take 1 tablet (200 mcg total) by mouth daily before breakfast. 30 tablet 0  . lidocaine (LIDODERM) 5 % Place 1 patch onto the skin daily. Remove & Discard patch within 12 hours or as directed by MD 30  patch 0  . lisinopril (ZESTRIL) 10 MG tablet TAKE 1 TABLET BY MOUTH DAILY 30 tablet 2  . meloxicam (MOBIC) 7.5 MG tablet Take 1 tablet (7.5 mg total) by mouth daily. 30 tablet 0  . metFORMIN (GLUCOPHAGE) 1000 MG tablet TAKE 1 TABLET BY MOUTH TWICE DAILY FOR 30 DAYS 60 tablet 3  . morphine (MS CONTIN) 30 MG 12 hr tablet Take 1 tablet by mouth 2 (two) times daily. Take with the $Remove'60mg'HCitkDW$      . morphine (MS CONTIN) 60 MG 12 hr tablet Take 1 tablet by mouth 2 (two) times daily. Takes with the $Remove'30mg'yOVpKaO$      . Omega-3 Fatty Acids (FISH OIL) 1000 MG CAPS Take 2 capsules (2,000 mg total) by mouth in the morning and at bedtime. 360 capsule 3  . simvastatin (ZOCOR) 40 MG tablet TAKE 1 TABLET BY MOUTH IN THE EVENING ONCE A DAILY EMERGENCY REFILL FAXED DR 30 tablet 3  . insulin aspart (NOVOLOG) 100 UNIT/ML FlexPen Inject 0-14 Units into the skin 3 (three) times daily with meals. 15 mL 1  . insulin detemir (LEVEMIR) 100 UNIT/ML FlexPen Inject 30 Units into the skin 2 (two) times daily. 15 mL 11   No facility-administered medications prior to visit.    Allergies  Allergen Reactions  . Codeine Other (See Comments)    Slurred Speech   . Other     [place], sweat potatoes,   . Penicillins Rash    ROS Review of Systems  Constitutional: Negative.   Respiratory: Negative.   Cardiovascular: Negative.   Musculoskeletal: Negative.   Psychiatric/Behavioral: Negative.       Objective:    Physical Exam Constitutional:      Appearance: He is obese.  Cardiovascular:     Rate and Rhythm: Normal rate and regular rhythm.     Pulses: Normal pulses.     Heart sounds: Normal heart sounds.  Pulmonary:     Effort: Pulmonary effort is normal.     Breath sounds: Normal breath sounds.  Neurological:     Mental Status: He is alert.  Psychiatric:        Mood and Affect: Mood normal.        Behavior: Behavior normal.  Thought Content: Thought content normal.        Judgment: Judgment normal.     BP 125/80    Pulse 80   Temp 98.5 F (36.9 C)   Resp 20   Ht $R'5\' 7"'Uz$  (1.702 m)   Wt 249 lb (112.9 kg)   SpO2 95%   BMI 39.00 kg/m  Wt Readings from Last 3 Encounters:  04/17/20 249 lb (112.9 kg)  04/03/20 252 lb (114.3 kg)  03/27/20 252 lb (114.3 kg)     Health Maintenance Due  Topic Date Due  . PNEUMOCOCCAL POLYSACCHARIDE VACCINE AGE 63-64 HIGH RISK  Never done  . OPHTHALMOLOGY EXAM  Never done  . COLONOSCOPY (Pts 45-1yrs Insurance coverage will need to be confirmed)  Never done  . COVID-19 Vaccine (3 - Booster for Moderna series) 04/19/2020    There are no preventive care reminders to display for this patient.  Lab Results  Component Value Date   TSH 1.430 03/07/2020   Lab Results  Component Value Date   WBC 8.1 03/27/2020   HGB 15.6 03/27/2020   HCT 46.1 03/27/2020   MCV 83 03/27/2020   PLT 206 03/27/2020   Lab Results  Component Value Date   NA 137 03/27/2020   K 4.5 03/27/2020   CO2 23 03/27/2020   GLUCOSE 213 (H) 03/27/2020   BUN 16 03/27/2020   CREATININE 0.75 (L) 03/27/2020   BILITOT 0.3 03/27/2020   ALKPHOS 72 03/27/2020   AST 30 03/27/2020   ALT 47 (H) 03/27/2020   PROT 7.1 03/27/2020   ALBUMIN 4.8 03/27/2020   CALCIUM 9.9 03/27/2020   ANIONGAP 7 03/22/2020   Lab Results  Component Value Date   CHOL 150 03/27/2020   Lab Results  Component Value Date   HDL 42 03/27/2020   Lab Results  Component Value Date   LDLCALC 78 03/27/2020   Lab Results  Component Value Date   TRIG 176 (H) 03/27/2020   Lab Results  Component Value Date   CHOLHDL 3.7 02/05/2017   Lab Results  Component Value Date   HGBA1C 10.9 (H) 03/27/2020      Assessment & Plan:   Problem List Items Addressed This Visit      Endocrine   Diabetes mellitus type 2, insulin dependent ([place])    Lab Results  Component Value Date   HGBA1C 10.9 (H) 03/27/2020   -poor dietary compliance -he refused endocrinology d/t copay -taking metformin 1000 mg BID -INCREASE novolog to custom  dose sliding scale -on lisinopril and simvastatin -stopped glipizide when he was started on insulin -INCREASE levemir to 35 units BID      Relevant Medications   insulin detemir (LEVEMIR) 100 UNIT/ML FlexPen   insulin aspart (NOVOLOG) 100 UNIT/ML FlexPen   Other Relevant Orders   CBC with Differential/Platelet   CMP14+EGFR   Hemoglobin A1c   Microalbumin / creatinine urine ratio   Lipid Panel With LDL/HDL Ratio    Other Visit Diagnoses    General medical exam    -  Primary   Relevant Orders   PSA      Meds ordered this encounter  Medications  . insulin detemir (LEVEMIR) 100 UNIT/ML FlexPen    Sig: Inject 35 Units into the skin 2 (two) times daily.    Dispense:  15 mL    Refill:  11  . insulin aspart (NOVOLOG) 100 UNIT/ML FlexPen    Sig: Inject 0-20 Units into the skin 3 (three) times daily with meals. For blood sugar  70-139, no insulin; 140-180 = 7 units; 181-240 = 9 units; 241-300 = 12 units; 301-350 = 14 units; 351-400 = 17 units; > 400 = 20 units and call MD    Dispense:  15 mL    Refill:  1    Follow-up: Return in about 2 weeks (around 05/01/2020).    Noreene Larsson, NP

## 2020-04-17 NOTE — Patient Instructions (Signed)
Diabetes Mellitus and Nutrition, Adult When you have diabetes, or diabetes mellitus, it is very important to have healthy eating habits because your blood sugar (glucose) levels are greatly affected by what you eat and drink. Eating healthy foods in the right amounts, at about the same times every day, can help you:  Control your blood glucose.  Lower your risk of heart disease.  Improve your blood pressure.  Reach or maintain a healthy weight. What can affect my meal plan? Every person with diabetes is different, and each person has different needs for a meal plan. Your health care provider may recommend that you work with a dietitian to make a meal plan that is best for you. Your meal plan may vary depending on factors such as:  The calories you need.  The medicines you take.  Your weight.  Your blood glucose, blood pressure, and cholesterol levels.  Your activity level.  Other health conditions you have, such as heart or kidney disease. How do carbohydrates affect me? Carbohydrates, also called carbs, affect your blood glucose level more than any other type of food. Eating carbs naturally raises the amount of glucose in your blood. Carb counting is a method for keeping track of how many carbs you eat. Counting carbs is important to keep your blood glucose at a healthy level, especially if you use insulin or take certain oral diabetes medicines. It is important to know how many carbs you can safely have in each meal. This is different for every person. Your dietitian can help you calculate how many carbs you should have at each meal and for each snack. How does alcohol affect me? Alcohol can cause a sudden decrease in blood glucose (hypoglycemia), especially if you use insulin or take certain oral diabetes medicines. Hypoglycemia can be a life-threatening condition. Symptoms of hypoglycemia, such as sleepiness, dizziness, and confusion, are similar to symptoms of having too much  alcohol.  Do not drink alcohol if: ? Your health care provider tells you not to drink. ? You are pregnant, may be pregnant, or are planning to become pregnant.  If you drink alcohol: ? Do not drink on an empty stomach. ? Limit how much you use to:  0-1 drink a day for women.  0-2 drinks a day for men. ? Be aware of how much alcohol is in your drink. In the [place] one drink equals one 12 oz bottle of beer (355 mL), one 5 oz glass of wine (148 mL), or one 1 oz glass of hard liquor (44 mL). ? Keep yourself hydrated with water, diet soda, or unsweetened iced tea.  Keep in mind that regular soda, juice, and other mixers may contain a lot of sugar and must be counted as carbs. What are tips for following this plan? Reading food labels  Start by checking the serving size on the "Nutrition Facts" label of packaged foods and drinks. The amount of calories, carbs, fats, and other nutrients listed on the label is based on one serving of the item. Many items contain more than one serving per package.  Check the total grams (g) of carbs in one serving. You can calculate the number of servings of carbs in one serving by dividing the total carbs by 15. For example, if a food has 30 g of total carbs per serving, it would be equal to 2 servings of carbs.  Check the number of grams (g) of saturated fats and trans fats in one serving. Choose foods that have   a low amount or none of these fats.  Check the number of milligrams (mg) of salt (sodium) in one serving. Most people should limit total sodium intake to less than 2,300 mg per day.  Always check the nutrition information of foods labeled as "low-fat" or "nonfat." These foods may be higher in added sugar or refined carbs and should be avoided.  Talk to your dietitian to identify your daily goals for nutrients listed on the label. Shopping  Avoid buying canned, pre-made, or processed foods. These foods tend to be high in fat, sodium, and added  sugar.  Shop around the outside edge of the grocery store. This is where you will most often find fresh fruits and vegetables, bulk grains, fresh meats, and fresh dairy. Cooking  Use low-heat cooking methods, such as baking, instead of high-heat cooking methods like deep frying.  Cook using healthy oils, such as olive, canola, or sunflower oil.  Avoid cooking with butter, cream, or high-fat meats. Meal planning  Eat meals and snacks regularly, preferably at the same times every day. Avoid going long periods of time without eating.  Eat foods that are high in fiber, such as fresh fruits, vegetables, beans, and whole grains. Talk with your dietitian about how many servings of carbs you can eat at each meal.  Eat 4-6 oz (112-168 g) of lean protein each day, such as lean meat, chicken, fish, eggs, or tofu. One ounce (oz) of lean protein is equal to: ? 1 oz (28 g) of meat, chicken, or fish. ? 1 egg. ?  cup (62 g) of tofu.  Eat some foods each day that contain healthy fats, such as avocado, nuts, seeds, and fish.   What foods should I eat? Fruits Berries. Apples. Oranges. Peaches. Apricots. Plums. Grapes. Mango. Papaya. Pomegranate. Kiwi. Cherries. Vegetables Lettuce. Spinach. Leafy greens, including kale, chard, collard greens, and mustard greens. Beets. Cauliflower. Cabbage. Broccoli. Carrots. Green beans. Tomatoes. Peppers. Onions. Cucumbers. Brussels sprouts. Grains Whole grains, such as whole-wheat or whole-grain bread, crackers, tortillas, cereal, and pasta. Unsweetened oatmeal. Quinoa. Brown or wild rice. Meats and other proteins Seafood. Poultry without skin. Lean cuts of poultry and beef. Tofu. Nuts. Seeds. Dairy Low-fat or fat-free dairy products such as milk, yogurt, and cheese. The items listed above may not be a complete list of foods and beverages you can eat. Contact a dietitian for more information. What foods should I avoid? Fruits Fruits canned with  syrup. Vegetables Canned vegetables. Frozen vegetables with butter or cream sauce. Grains Refined white flour and flour products such as bread, pasta, snack foods, and cereals. Avoid all processed foods. Meats and other proteins Fatty cuts of meat. Poultry with skin. Breaded or fried meats. Processed meat. Avoid saturated fats. Dairy Full-fat yogurt, cheese, or milk. Beverages Sweetened drinks, such as soda or iced tea. The items listed above may not be a complete list of foods and beverages you should avoid. Contact a dietitian for more information. Questions to ask a health care provider  Do I need to meet with a diabetes educator?  Do I need to meet with a dietitian?  What number can I call if I have questions?  When are the best times to check my blood glucose? Where to find more information:  American Diabetes Association: diabetes.org  Academy of Nutrition and Dietetics: www.eatright.org  National Institute of Diabetes and Digestive and Kidney Diseases: www.niddk.nih.gov  Association of Diabetes Care and Education Specialists: www.diabeteseducator.org Summary  It is important to have healthy eating   habits because your blood sugar (glucose) levels are greatly affected by what you eat and drink.  A healthy meal plan will help you control your blood glucose and maintain a healthy lifestyle.  Your health care provider may recommend that you work with a dietitian to make a meal plan that is best for you.  Keep in mind that carbohydrates (carbs) and alcohol have immediate effects on your blood glucose levels. It is important to count carbs and to use alcohol carefully. This information is not intended to replace advice given to you by your health care provider. Make sure you discuss any questions you have with your health care provider. Document Revised: 01/17/2019 Document Reviewed: 01/17/2019 Elsevier Patient Education  2021 Elsevier Inc.  

## 2020-04-17 NOTE — Assessment & Plan Note (Addendum)
Lab Results  Component Value Date   HGBA1C 10.9 (H) 03/27/2020   -poor dietary compliance -he refused endocrinology d/t copay -taking metformin 1000 mg BID -INCREASE novolog to custom dose sliding scale -on lisinopril and simvastatin -stopped glipizide when he was started on insulin -INCREASE levemir to 35 units BID

## 2020-04-18 DIAGNOSIS — M25551 Pain in right hip: Secondary | ICD-10-CM | POA: Diagnosis not present

## 2020-04-18 DIAGNOSIS — Z79899 Other long term (current) drug therapy: Secondary | ICD-10-CM | POA: Diagnosis not present

## 2020-04-18 DIAGNOSIS — G894 Chronic pain syndrome: Secondary | ICD-10-CM | POA: Diagnosis not present

## 2020-04-18 DIAGNOSIS — M5417 Radiculopathy, lumbosacral region: Secondary | ICD-10-CM | POA: Diagnosis not present

## 2020-04-22 ENCOUNTER — Other Ambulatory Visit: Payer: Self-pay | Admitting: Family Medicine

## 2020-04-25 ENCOUNTER — Telehealth: Payer: Self-pay

## 2020-04-25 ENCOUNTER — Other Ambulatory Visit: Payer: Self-pay

## 2020-04-25 DIAGNOSIS — M1250 Traumatic arthropathy, unspecified site: Secondary | ICD-10-CM

## 2020-04-25 MED ORDER — MELOXICAM 7.5 MG PO TABS: 7.5000 mg | ORAL_TABLET | Freq: Every day | ORAL | 0 refills | Status: DC

## 2020-04-25 NOTE — Telephone Encounter (Signed)
Patient called needs his arthritis med refill   Pharmacy: [facility]

## 2020-04-25 NOTE — Telephone Encounter (Signed)
Rx refilled.

## 2020-04-30 ENCOUNTER — Other Ambulatory Visit: Payer: Self-pay | Admitting: Family Medicine

## 2020-04-30 DIAGNOSIS — Z23 Encounter for immunization: Secondary | ICD-10-CM | POA: Diagnosis not present

## 2020-04-30 NOTE — Progress Notes (Signed)
His labs show that his glucose is still elevated, but otherwise his labs look good.

## 2020-05-01 ENCOUNTER — Other Ambulatory Visit: Payer: Self-pay

## 2020-05-01 ENCOUNTER — Encounter: Payer: Self-pay | Admitting: Nurse Practitioner

## 2020-05-01 ENCOUNTER — Telehealth (INDEPENDENT_AMBULATORY_CARE_PROVIDER_SITE_OTHER): Payer: Medicare HMO | Admitting: Nurse Practitioner

## 2020-05-01 DIAGNOSIS — E785 Hyperlipidemia, unspecified: Secondary | ICD-10-CM

## 2020-05-01 DIAGNOSIS — E1169 Type 2 diabetes mellitus with other specified complication: Secondary | ICD-10-CM

## 2020-05-01 LAB — CBC WITH DIFFERENTIAL/PLATELET
Basophils Absolute: 0.1 10*3/uL (ref 0.0–0.2)
Basos: 1 %
EOS (ABSOLUTE): 0.5 10*3/uL — ABNORMAL HIGH (ref 0.0–0.4)
Eos: 8 %
Hematocrit: 45.4 % (ref 37.5–51.0)
Hemoglobin: 14.9 g/dL (ref 13.0–17.7)
Immature Grans (Abs): 0 10*3/uL (ref 0.0–0.1)
Immature Granulocytes: 0 %
Lymphocytes Absolute: 2.2 10*3/uL (ref 0.7–3.1)
Lymphs: 35 %
MCH: 28.3 pg (ref 26.6–33.0)
MCHC: 32.8 g/dL (ref 31.5–35.7)
MCV: 86 fL (ref 79–97)
Monocytes Absolute: 0.6 10*3/uL (ref 0.1–0.9)
Monocytes: 9 %
Neutrophils Absolute: 2.9 10*3/uL (ref 1.4–7.0)
Neutrophils: 47 %
Platelets: 184 10*3/uL (ref 150–450)
RBC: 5.27 x10E6/uL (ref 4.14–5.80)
RDW: 14.5 % (ref 11.6–15.4)
WBC: 6.1 10*3/uL (ref 3.4–10.8)

## 2020-05-01 LAB — LIPID PANEL WITH LDL/HDL RATIO
Cholesterol, Total: 144 mg/dL (ref 100–199)
HDL: 38 mg/dL — ABNORMAL LOW (ref >39)
LDL Chol Calc (NIH): 76 mg/dL (ref 0–99)
LDL/HDL Ratio: 2 ratio (ref 0.0–3.6)
Triglycerides: 173 mg/dL — ABNORMAL HIGH (ref 0–149)
VLDL Cholesterol Cal: 30 mg/dL (ref 5–40)

## 2020-05-01 LAB — CMP14+EGFR
ALT: 38 IU/L (ref 0–44)
AST: 24 IU/L (ref 0–40)
Albumin/Globulin Ratio: 1.8 (ref 1.2–2.2)
Albumin: 4.5 g/dL (ref 3.8–4.8)
Alkaline Phosphatase: 73 IU/L (ref 44–121)
BUN/Creatinine Ratio: 18 (ref 10–24)
BUN: 18 mg/dL (ref 8–27)
Bilirubin Total: 0.4 mg/dL (ref 0.0–1.2)
CO2: 18 mmol/L — ABNORMAL LOW (ref 20–29)
Calcium: 9.8 mg/dL (ref 8.6–10.2)
Chloride: 99 mmol/L (ref 96–106)
Creatinine, Ser: 0.99 mg/dL (ref 0.76–1.27)
Globulin, Total: 2.5 g/dL (ref 1.5–4.5)
Glucose: 224 mg/dL — ABNORMAL HIGH (ref 65–99)
Potassium: 4.7 mmol/L (ref 3.5–5.2)
Sodium: 136 mmol/L (ref 134–144)
Total Protein: 7 g/dL (ref 6.0–8.5)
eGFR: 87 mL/min/{1.73_m2} (ref >59)

## 2020-05-01 LAB — PSA: Prostate Specific Ag, Serum: 1.2 ng/mL (ref 0.0–4.0)

## 2020-05-01 LAB — MICROALBUMIN / CREATININE URINE RATIO
Creatinine, Urine: 203.6 mg/dL
Microalb/Creat Ratio: 8 mg/g creat (ref 0–29)
Microalbumin, Urine: 16.1 ug/mL

## 2020-05-01 LAB — HEMOGLOBIN A1C
Est. average glucose Bld gHb Est-mCnc: 252 mg/dL
Hgb A1c MFr Bld: 10.4 % — ABNORMAL HIGH (ref 4.8–5.6)

## 2020-05-01 MED ORDER — INSULIN ASPART 100 UNIT/ML FLEXPEN: 0.0000 [IU] | PEN_INJECTOR | Freq: Three times a day (TID) | SUBCUTANEOUS | 1 refills | Status: DC

## 2020-05-01 NOTE — Assessment & Plan Note (Signed)
-  keeps sliding scale, but will add 5 units of novolog at bedtime for CBG > 200 -f/u in 3 monts

## 2020-05-01 NOTE — Progress Notes (Signed)
Acute Office Visit  Subjective:    Patient ID: Thomas Rollins, male    DOB: [birth year], 60 y.o.   MRN: [id number]  Chief Complaint  Patient presents with  . Diabetes    Follow up, go over labs.     HPI Patient is in today for blood sugar check. His blood sugars have been running   He was seen in-office on 04/17/20, and at that time he was started on novolog per custom sliding scale. He was instructed to bring blood sugar logs with him to this visit. He states his breakfast readings have been from 170-292. He states his afternoon blood sugars have been pretty good, but he is snacking after his dinner insulin dose, so his AM sugars are elevated.  He is having televisit today because he had covid booster yesterday and is not feeling great.   Past Medical History:  Diagnosis Date  . Anxiety    panic attacks  . Chronic pain   . Depression   . Diabetes mellitus without complication ([place])   . Encounter for hepatitis C screening test for low risk patient 04/05/2019  . Hyperlipidemia   . Hypertension     Past Surgical History:  Procedure Laterality Date  . ankle sx    . ARTHROSCOPIC REPAIR ACL    . CARPAL TUNNEL RELEASE    . HERNIA REPAIR    . SHOULDER ARTHROSCOPY      Family History  Problem Relation Age of Onset  . Stomach cancer Mother   . Heart Problems Father   . Dementia Father   . Cancer Father   . Bone cancer Brother   . Lung cancer Brother   . Cancer Daughter     Social History   Socioeconomic History  . Marital status: Divorced    Spouse name: Not on file  . Number of children: 1  . Years of education: Not on file  . Highest education level: 12th grade  Occupational History  . Not on file  Tobacco Use  . Smoking status: Former Smoker    Types: Cigarettes  . Smokeless tobacco: Never Used  Vaping Use  . Vaping Use: Never used  Substance and Sexual Activity  . Alcohol use: Yes    Comment: occ  . Drug use: Yes    Types: Marijuana     Comment: 1-2 times a year   . Sexual activity: Never  Other Topics Concern  . 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   Takes time release morphine b/c fell 15-20 feet from a building while working.    Now disabled        Exercises/walks: 7 days a week   Has pets at home: 5 dogs in the home.      Diet: Avoids white foods, fruits, veggies, does eat some fast food/eat out    Caffeine: none only hot chocolate or soda when stomach is upset   Water: 8 cups daily if not more      Wears seat belt   Smoke detectors and carbon monoxide detectors at home    Does not use phone while driving                      Social Determinants of Engineer, drilling  Resource Strain: Not on file  Food Insecurity: Not on file  Transportation Needs: Not on file  Physical Activity: Not on file  Stress: Not on file  Social Connections: Not on file  Intimate Partner Violence: Not on file    Outpatient Medications Prior to Visit  Medication Sig Dispense Refill  . ALPHAGAN P 0.1 % SOLN Apply 1 drop to eye 2 (two) times daily.    . blood glucose meter kit and supplies Dispense based on patient and insurance preference. Use up to four times daily as directed. (FOR ICD-10 E10.9, E11.9). 1 each 0  . DULoxetine (CYMBALTA) 60 MG capsule TAKE 1 CAPSULE BY MOUTH EVERY DAY 90 capsule 0  . fluticasone (FLONASE) 50 MCG/ACT nasal spray   0  . gabapentin (NEURONTIN) 300 MG capsule TAKE ONE CAPSULE BY MOUTH TWICE DAILY 60 capsule 2  . glipiZIDE (GLUCOTROL XL) 10 MG 24 hr tablet TAKE 1 TABLET BY MOUTH DAILY WITH BREAKFAST 30 tablet 0  . GLOBAL EASE INJECT PEN NEEDLES 31G X 8 MM MISC 1 each by Other route as directed.    . insulin aspart (NOVOLOG) 100 UNIT/ML FlexPen Inject 0-20 Units into the skin 3 (three) times daily with meals. For blood sugar 70-139, no insulin;  140-180 = 7 units; 181-240 = 9 units; 241-300 = 12 units; 301-350 = 14 units; 351-400 = 17 units; > 400 = 20 units and call MD 15 mL 1  . insulin detemir (LEVEMIR) 100 UNIT/ML FlexPen Inject 35 Units into the skin 2 (two) times daily. 15 mL 11  . levothyroxine (SYNTHROID) 125 MCG tablet TAKE 1 TABLET BY MOUTH EVERY MORNING ON AN EMPTY STOMACH 30 tablet 2  . levothyroxine (SYNTHROID) 200 MCG tablet Take 1 tablet (200 mcg total) by mouth daily before breakfast. 30 tablet 0  . lidocaine (LIDODERM) 5 % Place 1 patch onto the skin daily. Remove & Discard patch within 12 hours or as directed by MD 30 patch 0  . lisinopril (ZESTRIL) 10 MG tablet TAKE 1 TABLET BY MOUTH DAILY 30 tablet 2  . meloxicam (MOBIC) 7.5 MG tablet Take 1 tablet (7.5 mg total) by mouth daily. 30 tablet 0  . metFORMIN (GLUCOPHAGE) 1000 MG tablet TAKE 1 TABLET BY MOUTH TWICE DAILY FOR 30 DAYS 60 tablet 3  . morphine (MS CONTIN) 30 MG 12 hr tablet Take 1 tablet by mouth 2 (two) times daily. Take with the 91m     . morphine (MS CONTIN) 60 MG 12 hr tablet Take 1 tablet by mouth 2 (two) times daily. Takes with the 324m    . Omega-3 Fatty Acids (FISH OIL) 1000 MG CAPS Take 2 capsules (2,000 mg total) by mouth in the morning and at bedtime. 360 capsule 3  . simvastatin (ZOCOR) 40 MG tablet TAKE 1 TABLET BY MOUTH IN THE EVENING ONCE A DAILY EMERGENCY REFILL FAXED DR 30 tablet 3  . TRESIBA FLEXTOUCH 200 UNIT/ML FlexTouch Pen INJECT 50-52 UNITS INTO THE SKIN AT BEDTIME 9 mL 0   No facility-administered medications prior to visit.    Allergies  Allergen Reactions  . Codeine Other (See Comments)    Slurred Speech   . Other     [place] potatoes,   . Penicillins Rash    Review of Systems  Constitutional: Negative.   Respiratory: Negative.   Cardiovascular: Negative.   Endocrine:       Blood sugars elevated with AM worse than PM       Objective:  Physical Exam  There were no vitals taken for this visit. Wt Readings  from Last 3 Encounters:  04/17/20 249 lb (112.9 kg)  04/03/20 252 lb (114.3 kg)  03/27/20 252 lb (114.3 kg)    Health Maintenance Due  Topic Date Due  . PNEUMOCOCCAL POLYSACCHARIDE VACCINE AGE 73-64 HIGH RISK  Never done  . OPHTHALMOLOGY EXAM  Never done  . COLONOSCOPY (Pts 45-51yr Insurance coverage will need to be confirmed)  Never done  . COVID-19 Vaccine (3 - Booster for Moderna series) 04/19/2020    There are no preventive care reminders to display for this patient.   Lab Results  Component Value Date   TSH 1.430 03/07/2020   Lab Results  Component Value Date   WBC 6.1 04/29/2020   HGB 14.9 04/29/2020   HCT 45.4 04/29/2020   MCV 86 04/29/2020   PLT 184 04/29/2020   Lab Results  Component Value Date   NA 136 04/29/2020   K 4.7 04/29/2020   CO2 18 (L) 04/29/2020   GLUCOSE 224 (H) 04/29/2020   BUN 18 04/29/2020   CREATININE 0.99 04/29/2020   BILITOT 0.4 04/29/2020   ALKPHOS 73 04/29/2020   AST 24 04/29/2020   ALT 38 04/29/2020   PROT 7.0 04/29/2020   ALBUMIN 4.5 04/29/2020   CALCIUM 9.8 04/29/2020   ANIONGAP 7 03/22/2020   Lab Results  Component Value Date   CHOL 144 04/29/2020   Lab Results  Component Value Date   HDL 38 (L) 04/29/2020   Lab Results  Component Value Date   LDLCALC 76 04/29/2020   Lab Results  Component Value Date   TRIG 173 (H) 04/29/2020   Lab Results  Component Value Date   CHOLHDL 3.7 02/05/2017   Lab Results  Component Value Date   HGBA1C 10.4 (H) 04/29/2020       Assessment & Plan:   Problem List Items Addressed This Visit      Endocrine   Hyperlipidemia associated with type 2 diabetes mellitus ([place]    -keeps sliding scale, but will add 5 units of novolog at bedtime for CBG > 200 -f/u in 3 monts          No orders of the defined types were placed in this encounter.  Date:  05/01/2020   Location of Patient: Home Location of Provider: Office Consent was obtain for visit to be over via telehealth. I  verified that I am speaking with the correct person using two identifiers.  I connected with  JLenardo Westwoodon 05/01/20 via telephone and verified that I am speaking with the correct person using two identifiers.   I discussed the limitations of evaluation and management by telemedicine. The patient expressed understanding and agreed to proceed.  Time spent: 9 minutes   JNoreene Larsson NP

## 2020-05-07 DIAGNOSIS — G4733 Obstructive sleep apnea (adult) (pediatric): Secondary | ICD-10-CM | POA: Diagnosis not present

## 2020-05-12 ENCOUNTER — Other Ambulatory Visit: Payer: Self-pay | Admitting: Nurse Practitioner

## 2020-05-12 ENCOUNTER — Other Ambulatory Visit: Payer: Self-pay | Admitting: Family Medicine

## 2020-05-12 DIAGNOSIS — I10 Essential (primary) hypertension: Secondary | ICD-10-CM

## 2020-05-16 DIAGNOSIS — M25512 Pain in left shoulder: Secondary | ICD-10-CM | POA: Diagnosis not present

## 2020-05-16 DIAGNOSIS — E1140 Type 2 diabetes mellitus with diabetic neuropathy, unspecified: Secondary | ICD-10-CM | POA: Diagnosis not present

## 2020-05-16 DIAGNOSIS — G894 Chronic pain syndrome: Secondary | ICD-10-CM | POA: Diagnosis not present

## 2020-05-16 DIAGNOSIS — Z79899 Other long term (current) drug therapy: Secondary | ICD-10-CM | POA: Diagnosis not present

## 2020-05-23 DIAGNOSIS — G4733 Obstructive sleep apnea (adult) (pediatric): Secondary | ICD-10-CM | POA: Diagnosis not present

## 2020-05-27 ENCOUNTER — Other Ambulatory Visit: Payer: Self-pay | Admitting: Nurse Practitioner

## 2020-05-27 ENCOUNTER — Other Ambulatory Visit: Payer: Self-pay | Admitting: Family Medicine

## 2020-05-27 DIAGNOSIS — M1250 Traumatic arthropathy, unspecified site: Secondary | ICD-10-CM

## 2020-06-04 ENCOUNTER — Other Ambulatory Visit: Payer: Self-pay | Admitting: Nurse Practitioner

## 2020-06-07 DIAGNOSIS — G4733 Obstructive sleep apnea (adult) (pediatric): Secondary | ICD-10-CM | POA: Diagnosis not present

## 2020-06-11 ENCOUNTER — Other Ambulatory Visit: Payer: Self-pay | Admitting: Nurse Practitioner

## 2020-06-11 DIAGNOSIS — I10 Essential (primary) hypertension: Secondary | ICD-10-CM

## 2020-06-16 ENCOUNTER — Other Ambulatory Visit: Payer: Self-pay | Admitting: Nurse Practitioner

## 2020-06-16 DIAGNOSIS — M1250 Traumatic arthropathy, unspecified site: Secondary | ICD-10-CM

## 2020-06-17 DIAGNOSIS — M47817 Spondylosis without myelopathy or radiculopathy, lumbosacral region: Secondary | ICD-10-CM | POA: Diagnosis not present

## 2020-06-17 DIAGNOSIS — M5417 Radiculopathy, lumbosacral region: Secondary | ICD-10-CM | POA: Diagnosis not present

## 2020-06-17 DIAGNOSIS — M170 Bilateral primary osteoarthritis of knee: Secondary | ICD-10-CM | POA: Diagnosis not present

## 2020-06-22 DIAGNOSIS — G4733 Obstructive sleep apnea (adult) (pediatric): Secondary | ICD-10-CM | POA: Diagnosis not present

## 2020-06-24 ENCOUNTER — Other Ambulatory Visit: Payer: Self-pay | Admitting: Nurse Practitioner

## 2020-06-29 DIAGNOSIS — G5602 Carpal tunnel syndrome, left upper limb: Secondary | ICD-10-CM | POA: Diagnosis not present

## 2020-06-29 DIAGNOSIS — M19031 Primary osteoarthritis, right wrist: Secondary | ICD-10-CM | POA: Diagnosis not present

## 2020-06-29 DIAGNOSIS — M2352 Chronic instability of knee, left knee: Secondary | ICD-10-CM | POA: Diagnosis not present

## 2020-06-29 DIAGNOSIS — M19032 Primary osteoarthritis, left wrist: Secondary | ICD-10-CM | POA: Diagnosis not present

## 2020-06-29 DIAGNOSIS — M1712 Unilateral primary osteoarthritis, left knee: Secondary | ICD-10-CM | POA: Diagnosis not present

## 2020-06-29 DIAGNOSIS — G5601 Carpal tunnel syndrome, right upper limb: Secondary | ICD-10-CM | POA: Diagnosis not present

## 2020-07-01 ENCOUNTER — Other Ambulatory Visit: Payer: Self-pay | Admitting: Nurse Practitioner

## 2020-07-03 ENCOUNTER — Telehealth: Payer: Self-pay | Admitting: Family Medicine

## 2020-07-03 NOTE — Telephone Encounter (Signed)
Left message for patient to call back and schedule Medicare Annual Wellness Visit (AWV) either virtually or in office.   AWV-S PER PALMETTO 02/23/10    please schedule at anytime with [facility]  health coach  This should be a 40 minute visit.

## 2020-07-07 DIAGNOSIS — G4733 Obstructive sleep apnea (adult) (pediatric): Secondary | ICD-10-CM | POA: Diagnosis not present

## 2020-07-10 ENCOUNTER — Other Ambulatory Visit: Payer: Self-pay | Admitting: Nurse Practitioner

## 2020-07-10 ENCOUNTER — Other Ambulatory Visit: Payer: Self-pay | Admitting: Family Medicine

## 2020-07-10 DIAGNOSIS — M1250 Traumatic arthropathy, unspecified site: Secondary | ICD-10-CM

## 2020-07-10 DIAGNOSIS — I10 Essential (primary) hypertension: Secondary | ICD-10-CM

## 2020-07-16 DIAGNOSIS — M4726 Other spondylosis with radiculopathy, lumbar region: Secondary | ICD-10-CM | POA: Diagnosis not present

## 2020-07-17 ENCOUNTER — Other Ambulatory Visit: Payer: Self-pay | Admitting: Family Medicine

## 2020-07-17 ENCOUNTER — Other Ambulatory Visit: Payer: Self-pay | Admitting: Nurse Practitioner

## 2020-07-17 DIAGNOSIS — M792 Neuralgia and neuritis, unspecified: Secondary | ICD-10-CM

## 2020-07-18 ENCOUNTER — Other Ambulatory Visit: Payer: Self-pay

## 2020-07-18 ENCOUNTER — Ambulatory Visit (INDEPENDENT_AMBULATORY_CARE_PROVIDER_SITE_OTHER): Payer: Medicare HMO

## 2020-07-18 DIAGNOSIS — Z1211 Encounter for screening for malignant neoplasm of colon: Secondary | ICD-10-CM

## 2020-07-18 DIAGNOSIS — Z0000 Encounter for general adult medical examination without abnormal findings: Secondary | ICD-10-CM | POA: Diagnosis not present

## 2020-07-18 NOTE — Progress Notes (Signed)
Subjective:   [place] Lebeau is a 60 y.o. male who presents for Medicare Annual/Subsequent preventive examination.        Objective:    There were no vitals filed for this visit. There is no height or weight on file to calculate BMI.  Advanced Directives 09/17/2019 09/07/2019 09/30/2017 09/05/2017  Does Patient Have a Medical Advance Directive? No No No No    Current Medications (verified) Outpatient Encounter Medications as of 07/18/2020  Medication Sig  . ALPHAGAN P 0.1 % SOLN Apply 1 drop to eye 2 (two) times daily.  . blood glucose meter kit and supplies Dispense based on patient and insurance preference. Use up to four times daily as directed. (FOR ICD-10 E10.9, E11.9).  . DULoxetine (CYMBALTA) 60 MG capsule TAKE 1 CAPSULE BY MOUTH EVERY DAY  . fluticasone (FLONASE) 50 MCG/ACT nasal spray   . gabapentin (NEURONTIN) 300 MG capsule TAKE ONE CAPSULE BY MOUTH TWICE DAILY  . glipiZIDE (GLUCOTROL XL) 10 MG 24 hr tablet TAKE 1 TABLET BY MOUTH DAILY WITH BREAKFAST  . GLOBAL EASE INJECT PEN NEEDLES 31G X 8 MM MISC 1 each by Other route as directed.  . insulin aspart (NOVOLOG) 100 UNIT/ML FlexPen Inject 0-20 Units into the skin 4 (four) times daily -  before meals and at bedtime. For blood sugar at meal time 70-139, no insulin; 140-180 = 7 units; 181-240 = 9 units; 241-300 = 12 units; 301-350 = 14 units; 351-400 = 17 units; > 400 = 20 units and call MD At bedtime, for blood sugar > 200, take 5 units of novolog  . insulin detemir (LEVEMIR) 100 UNIT/ML FlexPen Inject 35 Units into the skin 2 (two) times daily.  [name] levothyroxine (SYNTHROID) 125 MCG tablet TAKE 1 TABLET BY MOUTH EVERY MORNING ON AN EMPTY STOMACH  . levothyroxine (SYNTHROID) 200 MCG tablet Take 1 tablet (200 mcg total) by mouth daily before breakfast.  . lidocaine (LIDODERM) 5 % Place 1 patch onto the skin daily. Remove & Discard patch within 12 hours or as directed by MD  . lisinopril (ZESTRIL) 10 MG tablet TAKE 1 TABLET  BY MOUTH DAILY  . meloxicam (MOBIC) 7.5 MG tablet TAKE 1 TABLET BY MOUTH DAILY  . metFORMIN (GLUCOPHAGE) 1000 MG tablet TAKE 1 TABLET BY MOUTH TWICE DAILY FOR 30 DAYS  . morphine (MS CONTIN) 30 MG 12 hr tablet Take 1 tablet by mouth 2 (two) times daily. Take with the $Remove'60mg'ldOlXzG$    . morphine (MS CONTIN) 60 MG 12 hr tablet Take 1 tablet by mouth 2 (two) times daily. Takes with the $Remove'30mg'vDedgQV$    . Omega-3 Fatty Acids (FISH OIL) 1000 MG CAPS Take 2 capsules (2,000 mg total) by mouth in the morning and at bedtime.  Glory Rosebush ULTRA test strip USE UP TO FOUR TIMES DAILY AS DIRECTED (25 DAY supply)  . simvastatin (ZOCOR) 40 MG tablet TAKE 1 TABLET BY MOUTH EVERY EVENING  . TRESIBA FLEXTOUCH 200 UNIT/ML FlexTouch Pen INJECT 50-52 UNITS INTO THE SKIN AT BEDTIME   No facility-administered encounter medications on file as of 07/18/2020.    Allergies (verified) Codeine, Other, and Penicillins   History: Past Medical History:  Diagnosis Date  . Anxiety    panic attacks  . Chronic pain   . Depression   . Diabetes mellitus without complication ([place])   . Encounter for hepatitis C screening test for low risk patient 04/05/2019  . Hyperlipidemia   . Hypertension    Past Surgical History:  Procedure Laterality  Date  . ankle sx    . ARTHROSCOPIC REPAIR ACL    . CARPAL TUNNEL RELEASE    . HERNIA REPAIR    . SHOULDER ARTHROSCOPY     Family History  Problem Relation Age of Onset  . Stomach cancer Mother   . Heart Problems Father   . Dementia Father   . Cancer Father   . Bone cancer Brother   . Lung cancer Brother   . Cancer Daughter    Social History   Socioeconomic History  . Marital status: Divorced    Spouse name: Not on file  . Number of children: 1  . Years of education: Not on file  . Highest education level: 12th grade  Occupational History  . Not on file  Tobacco Use  . Smoking status: Former Smoker    Types: Cigarettes  . Smokeless tobacco: Never Used  Vaping Use  . Vaping Use: Never  used  Substance and Sexual Activity  . Alcohol use: Yes    Comment: occ  . Drug use: Yes    Types: Marijuana    Comment: 1-2 times a year   . Sexual activity: Never  Other Topics Concern  . 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   Takes time release morphine b/c fell 15-20 feet from a building while working.    Now disabled        Exercises/walks: 7 days a week   Has pets at home: 5 dogs in the home.      Diet: Avoids white foods, fruits, veggies, does eat some fast food/eat out    Caffeine: none only hot chocolate or soda when stomach is upset   Water: 8 cups daily if not more      Wears seat belt   Smoke detectors and carbon monoxide detectors at home    Does not use phone while driving                      Social Determinants of Corporate investment banker Strain: Not on file  Food Insecurity: Not on file  Transportation Needs: Not on file  Physical Activity: Not on file  Stress: Not on file  Social Connections: Not on file    Tobacco Counseling Counseling given: Not Answered                  Diabetic? Yes          Activities of Daily Living In your present state of health, do you have any difficulty performing the following activities: 08/24/2019  Hearing? N  Vision? N  Difficulty concentrating or making decisions? N  Walking or climbing stairs? N  Dressing or bathing? N  Doing errands, shopping? N  Some recent data might be hidden    Patient Care Team: Freddy Finner, NP as PCP - General (Family Medicine) Aliene Beams, MD as PCP - Family Medicine (Family Medicine)  Indicate any recent Medical Services you may have received from other than [facility] providers in the past year (date may be approximate).     Assessment:   This is a routine wellness examination for [place].  Hearing/Vision  screen No exam data present  Dietary issues and exercise activities discussed:    Goals Addressed  None    Depression Screen PHQ 2/9 Scores 05/01/2020 04/17/2020 04/03/2020 03/27/2020 01/31/2020 11/01/2019 09/06/2019  PHQ - 2 Score 0 0 $R'1 2 1 2 'Gr$ 0  PHQ- 9 Score $Remov'4 3 3 6 'xgcwUX$ - 10 -    Fall Risk Fall Risk  05/01/2020 04/17/2020 04/03/2020 03/27/2020 01/31/2020  Falls in the past year? 0 0 0 0 1  Number falls in past yr: 0 0 0 0 1  Injury with Fall? 0 0 0 0 0  Risk for fall due to : No Fall Risks No Fall Risks No Fall Risks No Fall Risks Impaired balance/gait  Follow up Falls evaluation completed Falls evaluation completed Falls evaluation completed Falls evaluation completed Falls evaluation completed    FALL RISK PREVENTION PERTAINING TO THE HOME:  Any stairs in or around the home? Yes  If so, are there any without handrails? Yes  Home free of loose throw rugs in walkways, pet beds, electrical cords, etc? Yes  Adequate lighting in your home to reduce risk of falls? Yes   ASSISTIVE DEVICES UTILIZED TO PREVENT FALLS:  Life alert? No  Use of a cane, walker or w/c? No  Grab bars in the bathroom? No  Shower chair or bench in shower? No  Elevated toilet seat or a handicapped toilet? No   TIMED UP AND GO:  Was the test performed? No .           Immunizations Immunization History  Administered Date(s) Administered  . Influenza,inj,quad, With Preservative 12/16/2018  . Moderna Sars-Covid-2 Vaccination 09/20/2019, 10/18/2019    TDAP status: Due, Education has been provided regarding the importance of this vaccine. Advised may receive this vaccine at local pharmacy or Health Dept. Aware to provide a copy of the vaccination record if obtained from local pharmacy or Health Dept. Verbalized acceptance and understanding.  Flu Vaccine status: Due, Education has been provided regarding the importance of this vaccine. Advised may receive this vaccine at local pharmacy or Health Dept. Aware to provide a  copy of the vaccination record if obtained from local pharmacy or Health Dept. Verbalized acceptance and understanding.  Pneumococcal vaccine status: Due, Education has been provided regarding the importance of this vaccine. Advised may receive this vaccine at local pharmacy or Health Dept. Aware to provide a copy of the vaccination record if obtained from local pharmacy or Health Dept. Verbalized acceptance and understanding.  Covid-19 vaccine status: Completed vaccines  Qualifies for Shingles Vaccine? Yes   Zostavax completed No   Shingrix Completed?: No.    Education has been provided regarding the importance of this vaccine. Patient has been advised to call insurance company to determine out of pocket expense if they have not yet received this vaccine. Advised may also receive vaccine at local pharmacy or Health Dept. Verbalized acceptance and understanding.  Screening Tests Health Maintenance  Topic Date Due  . PNEUMOCOCCAL POLYSACCHARIDE VACCINE AGE 67-64 HIGH RISK  Never done  . OPHTHALMOLOGY EXAM  Never done  . COLONOSCOPY (Pts 45-11yrs Insurance coverage will need to be confirmed)  Never done  . COVID-19 Vaccine (3 - Booster for Moderna series) 03/19/2020  . FOOT EXAM  05/23/2020  . TETANUS/TDAP  10/31/2020 (Originally 08/27/1979)  . INFLUENZA VACCINE  09/23/2020  . HEMOGLOBIN A1C  10/30/2020  . Hepatitis C Screening  Completed  . HIV Screening  Completed  . HPV VACCINES  Aged Out    Health Maintenance  Health Maintenance Due  Topic Date Due  . PNEUMOCOCCAL POLYSACCHARIDE VACCINE AGE 67-64 HIGH  RISK  Never done  . OPHTHALMOLOGY EXAM  Never done  . COLONOSCOPY (Pts 45-33yrs Insurance coverage will need to be confirmed)  Never done  . COVID-19 Vaccine (3 - Booster for Moderna series) 03/19/2020  . FOOT EXAM  05/23/2020   Colorectal Screening: ordered.   Lung Cancer Screening: (Low Dose CT Chest recommended if Age 2-80 years, 30 pack-year currently smoking OR have quit w/in  15years.) does not qualify.    Additional Screening:  Hepatitis C Screening: does qualify; Completed.   Vision Screening: Recommended annual ophthalmology exams for early detection of glaucoma and other disorders of the eye. Is the patient up to date with their annual eye exam?  Yes  Who is the provider or what is the name of the office in which the patient attends annual eye exams? [facility]   If pt is not established with a provider, would they like to be referred to a provider to establish care? No .   Dental Screening: Recommended annual dental exams for proper oral hygiene  Community Resource Referral / Chronic Care Management: CRR required this visit?  No   CCM required this visit?  No      Plan:     I have personally reviewed and noted the following in the patient's chart:   . Medical and social history . Use of alcohol, tobacco or illicit drugs  . Current medications and supplements including opioid prescriptions. Patient is currently taking opioid prescriptions. Information provided to patient regarding non-opioid alternatives. Patient advised to discuss non-opioid treatment plan with their provider. . Functional ability and status . Nutritional status . Physical activity . Advanced directives . List of other physicians . Hospitalizations, surgeries, and ER visits in previous 12 months . Vitals . Screenings to include cognitive, depression, and falls . Referrals and appointments  In addition, I have reviewed and discussed with patient certain preventive protocols, quality metrics, and best practice recommendations. A written personalized care plan for preventive services as well as general preventive health recommendations were provided to patient.     Lonn Georgia, LPN   8/34/1964   Nurse Notes: AWV conducted over the phone with pt consent to televisit via audio. Pt was present in the home at the time of call and provider present in the office. This  call took approx. 20 minutes. Pt advised on immunizations due. Colonoscopy ordered.

## 2020-07-18 NOTE — Patient Instructions (Signed)
Thomas Rollins , Thank you for taking time to come for your Medicare Wellness Visit. I appreciate your ongoing commitment to your health goals. Please review the following plan we discussed and let me know if I can assist you in the future.   Screening recommendations/referrals: Colonoscopy: ordered   Recommended yearly ophthalmology/optometry visit for glaucoma screening and checkup Recommended yearly dental visit for hygiene and checkup  Vaccinations: Influenza vaccine: due, pt advised Fall of 2022  Pneumococcal vaccine: Eligible, due  Tdap vaccine: Due 10/31/20  Shingles vaccine: Eligible, education provided check with local pharmacy for pricing.     Advanced directives: Not at this time.   Conditions/risks identified: None   Next appointment: 08/01/20 @ 1:00pm with Laury Axon, AGNP-C   Preventive Care 40-64 Years, Male Preventive care refers to lifestyle choices and visits with your health care provider that can promote health and wellness. What does preventive care include?  A yearly physical exam. This is also called an annual well check.  Dental exams once or twice a year.  Routine eye exams. Ask your health care provider how often you should have your eyes checked.  Personal lifestyle choices, including:  Daily care of your teeth and gums.  Regular physical activity.  Eating a healthy diet.  Avoiding tobacco and drug use.  Limiting alcohol use.  Practicing safe sex.  Taking low-dose aspirin every day starting at age 61. What happens during an annual well check? The services and screenings done by your health care provider during your annual well check will depend on your age, overall health, lifestyle risk factors, and family history of disease. Counseling  Your health care provider may ask you questions about your:  Alcohol use.  Tobacco use.  Drug use.  Emotional well-being.  Home and relationship well-being.  Sexual activity.  Eating  habits.  Work and work Astronomer. Screening  You may have the following tests or measurements:  Height, weight, and BMI.  Blood pressure.  Lipid and cholesterol levels. These may be checked every 5 years, or more frequently if you are over 69 years old.  Skin check.  Lung cancer screening. You may have this screening every year starting at age 20 if you have a 30-pack-year history of smoking and currently smoke or have quit within the past 15 years.  Fecal occult blood test (FOBT) of the stool. You may have this test every year starting at age 85.  Flexible sigmoidoscopy or colonoscopy. You may have a sigmoidoscopy every 5 years or a colonoscopy every 10 years starting at age 44.  Prostate cancer screening. Recommendations will vary depending on your family history and other risks.  Hepatitis C blood test.  Hepatitis B blood test.  Sexually transmitted disease (STD) testing.  Diabetes screening. This is done by checking your blood sugar (glucose) after you have not eaten for a while (fasting). You may have this done every 1-3 years. Discuss your test results, treatment options, and if necessary, the need for more tests with your health care provider. Vaccines  Your health care provider may recommend certain vaccines, such as:  Influenza vaccine. This is recommended every year.  Tetanus, diphtheria, and acellular pertussis (Tdap, Td) vaccine. You may need a Td booster every 10 years.  Zoster vaccine. You may need this after age 80.  Pneumococcal 13-valent conjugate (PCV13) vaccine. You may need this if you have certain conditions and have not been vaccinated.  Pneumococcal polysaccharide (PPSV23) vaccine. You may need one or two doses if you  smoke cigarettes or if you have certain conditions. Talk to your health care provider about which screenings and vaccines you need and how often you need them. This information is not intended to replace advice given to you by your  health care provider. Make sure you discuss any questions you have with your health care provider. Document Released: 03/08/2015 Document Revised: 10/30/2015 Document Reviewed: 12/11/2014 Elsevier Interactive Patient Education  2017 [employer].  Fall Prevention in the Home Falls can cause injuries. They can happen to people of all ages. There are many things you can do to make your home safe and to help prevent falls. What can I do on the outside of my home?  Regularly fix the edges of walkways and driveways and fix any cracks.  Remove anything that might make you trip as you walk through a door, such as a raised step or threshold.  Trim any bushes or trees on the path to your home.  Use bright outdoor lighting.  Clear any walking paths of anything that might make someone trip, such as rocks or tools.  Regularly check to see if handrails are loose or broken. Make sure that both sides of any steps have handrails.  Any raised decks and porches should have guardrails on the edges.  Have any leaves, snow, or ice cleared regularly.  Use sand or salt on walking paths during winter.  Clean up any spills in your garage right away. This includes oil or grease spills. What can I do in the bathroom?  Use night lights.  Install grab bars by the toilet and in the tub and shower. Do not use towel bars as grab bars.  Use non-skid mats or decals in the tub or shower.  If you need to sit down in the shower, use a plastic, non-slip stool.  Keep the floor dry. Clean up any water that spills on the floor as soon as it happens.  Remove soap buildup in the tub or shower regularly.  Attach bath mats securely with double-sided non-slip rug tape.  Do not have throw rugs and other things on the floor that can make you trip. What can I do in the bedroom?  Use night lights.  Make sure that you have a light by your bed that is easy to reach.  Do not use any sheets or blankets that are too big  for your bed. They should not hang down onto the floor.  Have a firm chair that has side arms. You can use this for support while you get dressed.  Do not have throw rugs and other things on the floor that can make you trip. What can I do in the kitchen?  Clean up any spills right away.  Avoid walking on wet floors.  Keep items that you use a lot in easy-to-reach places.  If you need to reach something above you, use a strong step stool that has a grab bar.  Keep electrical cords out of the way.  Do not use floor polish or wax that makes floors slippery. If you must use wax, use non-skid floor wax.  Do not have throw rugs and other things on the floor that can make you trip. What can I do with my stairs?  Do not leave any items on the stairs.  Make sure that there are handrails on both sides of the stairs and use them. Fix handrails that are broken or loose. Make sure that handrails are as long as the  stairways.  Check any carpeting to make sure that it is firmly attached to the stairs. Fix any carpet that is loose or worn.  Avoid having throw rugs at the top or bottom of the stairs. If you do have throw rugs, attach them to the floor with carpet tape.  Make sure that you have a light switch at the top of the stairs and the bottom of the stairs. If you do not have them, ask someone to add them for you. What else can I do to help prevent falls?  Wear shoes that:  Do not have high heels.  Have rubber bottoms.  Are comfortable and fit you well.  Are closed at the toe. Do not wear sandals.  If you use a stepladder:  Make sure that it is fully opened. Do not climb a closed stepladder.  Make sure that both sides of the stepladder are locked into place.  Ask someone to hold it for you, if possible.  Clearly mark and make sure that you can see:  Any grab bars or handrails.  First and last steps.  Where the edge of each step is.  Use tools that help you move around  (mobility aids) if they are needed. These include:  Canes.  Walkers.  Scooters.  Crutches.  Turn on the lights when you go into a dark area. Replace any light bulbs as soon as they burn out.  Set up your furniture so you have a clear path. Avoid moving your furniture around.  If any of your floors are uneven, fix them.  If there are any pets around you, be aware of where they are.  Review your medicines with your doctor. Some medicines can make you feel dizzy. This can increase your chance of falling. Ask your doctor what other things that you can do to help prevent falls. This information is not intended to replace advice given to you by your health care provider. Make sure you discuss any questions you have with your health care provider. Document Released: 12/06/2008 Document Revised: 07/18/2015 Document Reviewed: 03/16/2014 Elsevier Interactive Patient Education  2017 [employer].

## 2020-07-23 DIAGNOSIS — G4733 Obstructive sleep apnea (adult) (pediatric): Secondary | ICD-10-CM | POA: Diagnosis not present

## 2020-07-25 DIAGNOSIS — G4733 Obstructive sleep apnea (adult) (pediatric): Secondary | ICD-10-CM | POA: Diagnosis not present

## 2020-07-29 ENCOUNTER — Other Ambulatory Visit: Payer: Self-pay | Admitting: Nurse Practitioner

## 2020-07-30 ENCOUNTER — Other Ambulatory Visit: Payer: Self-pay | Admitting: Nurse Practitioner

## 2020-08-01 ENCOUNTER — Other Ambulatory Visit: Payer: Self-pay

## 2020-08-01 ENCOUNTER — Ambulatory Visit (INDEPENDENT_AMBULATORY_CARE_PROVIDER_SITE_OTHER): Payer: Medicare HMO | Admitting: Nurse Practitioner

## 2020-08-01 ENCOUNTER — Encounter: Payer: Self-pay | Admitting: Nurse Practitioner

## 2020-08-01 VITALS — BP 115/71 | HR 88 | Temp 99.4°F | Resp 20 | Ht 68.0 in | Wt 248.0 lb

## 2020-08-01 DIAGNOSIS — E119 Type 2 diabetes mellitus without complications: Secondary | ICD-10-CM

## 2020-08-01 DIAGNOSIS — I10 Essential (primary) hypertension: Secondary | ICD-10-CM

## 2020-08-01 DIAGNOSIS — G4700 Insomnia, unspecified: Secondary | ICD-10-CM

## 2020-08-01 DIAGNOSIS — Z794 Long term (current) use of insulin: Secondary | ICD-10-CM

## 2020-08-01 DIAGNOSIS — E785 Hyperlipidemia, unspecified: Secondary | ICD-10-CM

## 2020-08-01 DIAGNOSIS — E039 Hypothyroidism, unspecified: Secondary | ICD-10-CM | POA: Diagnosis not present

## 2020-08-01 DIAGNOSIS — N529 Male erectile dysfunction, unspecified: Secondary | ICD-10-CM

## 2020-08-01 DIAGNOSIS — E1169 Type 2 diabetes mellitus with other specified complication: Secondary | ICD-10-CM

## 2020-08-01 MED ORDER — TRAZODONE HCL 50 MG PO TABS: 25.0000 mg | ORAL_TABLET | Freq: Every evening | ORAL | 3 refills | Status: DC | PRN

## 2020-08-01 NOTE — Assessment & Plan Note (Signed)
Lab Results  Component Value Date   HGBA1C 10.4 (H) 04/29/2020   -poor dietary compliance -he refused endocrinology d/t copay -taking metformin 1000 mg BID -taking novolog per custom dose sliding scale -on lisinopril and simvastatin -stopped glipizide when he was started on insulin -has been taking levemir to 35 units BID -checking A1c today

## 2020-08-01 NOTE — Assessment & Plan Note (Signed)
-  tried viagra in the past and he felt like his heart was going to come out of his chest and he had headaches -referral to urology

## 2020-08-01 NOTE — Progress Notes (Signed)
Established Patient Office Visit  Subjective:  Patient ID: Thomas Rollins, male    DOB: Mar 28, 1960  Age: 60 y.o. MRN: [id number]  CC:  Chief Complaint  Patient presents with   Hyperlipidemia   Diabetes    HPI Thomas Rollins presents for lab follow-up for DM and HLD.  He didn't have labs drawn prior to this visit.  He is having insomnia.  In the past he tried,   He has ED, and he tried viagra in the past and he states it gave him headaches and he felt like his heart was going to come out of his chest.    Past Medical History:  Diagnosis Date   Anxiety    panic attacks   Chronic pain    Depression    Diabetes mellitus without complication (HCC)    Encounter for hepatitis C screening test for low risk patient 04/05/2019   Hyperlipidemia    Hypertension     Past Surgical History:  Procedure Laterality Date   ankle sx     ARTHROSCOPIC REPAIR ACL     CARPAL TUNNEL RELEASE     HERNIA REPAIR     SHOULDER ARTHROSCOPY      Family History  Problem Relation Age of Onset   Stomach cancer Mother    Heart Problems Father    Dementia Father    Cancer Father    Bone cancer Brother    Lung cancer Brother    Cancer Daughter     Social History   Socioeconomic History   Marital status: Divorced    Spouse name: Not on file   Number of children: 1   Years of education: Not on file   Highest education level: 12th grade  Occupational History   Not on file  Tobacco Use   Smoking status: Former    Pack years: 0.00    Types: Cigarettes   Smokeless tobacco: Never  Vaping Use   Vaping Use: Never used  Substance and Sexual Activity   Alcohol use: Yes    Comment: occ   Drug use: Yes    Types: Marijuana    Comment: 1-2 times a year    Sexual activity: Never  Other Topics Concern   Not on file  Social History Narrative   Lives with good friend of his, known for 30 years    Only daughter passed away from cancer in November 30, 2016       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   Takes time release morphine b/c fell 15-20 feet from a building while working.    Now disabled        Exercises/walks: 7 days a week   Has pets at home: 5 dogs in the home.      Diet: Avoids white foods, fruits, veggies, does eat some fast food/eat out    Caffeine: none only hot chocolate or soda when stomach is upset   Water: 8 cups daily if not more      Wears seat belt   Smoke detectors and carbon monoxide detectors at home    Does not use phone while driving                      Social Determinants of Corporate investment banker Strain: Low Risk    Difficulty of Paying Living Expenses: Not very hard  Food Insecurity: No Food Insecurity   Worried About Running Out of Food in the Last Year: Never  true   Ran Out of Food in the Last Year: Never true  Transportation Needs: No Transportation Needs   Lack of Transportation (Medical): No   Lack of Transportation (Non-Medical): No  Physical Activity: Sufficiently Active   Days of Exercise per Week: 4 days   Minutes of Exercise per Session: 40 min  Stress: Stress Concern Present   Feeling of Stress : To some extent  Social Connections: Socially Isolated   Frequency of Communication with Friends and Family: Twice a week   Frequency of Social Gatherings with Friends and Family: Never   Attends Religious Services: Never   Printmaker: No   Attends Music therapist: Never   Marital Status: Divorced  Human resources officer Violence: Not At Risk   Fear of Current or Ex-Partner: No   Emotionally Abused: No   Physically Abused: No   Sexually Abused: No    Outpatient Medications Prior to Visit  Medication Sig Dispense Refill   ALPHAGAN P 0.1 % SOLN Apply 1 drop to eye 2 (two) times daily.     blood glucose meter kit and supplies Dispense based on patient and insurance preference. Use up to four times daily as directed. (FOR ICD-10 E10.9, E11.9). 1 each 0   DULoxetine (CYMBALTA)  60 MG capsule TAKE 1 CAPSULE BY MOUTH EVERY DAY 90 capsule 0   fluticasone (FLONASE) 50 MCG/ACT nasal spray   0   gabapentin (NEURONTIN) 300 MG capsule TAKE ONE CAPSULE BY MOUTH TWICE DAILY 60 capsule 2   GLOBAL EASE INJECT PEN NEEDLES 31G X 8 MM MISC 1 each by Other route as directed.     insulin detemir (LEVEMIR) 100 UNIT/ML FlexPen Inject 35 Units into the skin 2 (two) times daily. 15 mL 11   levothyroxine (SYNTHROID) 125 MCG tablet TAKE 1 TABLET BY MOUTH EVERY MORNING ON AN EMPTY STOMACH 30 tablet 2   levothyroxine (SYNTHROID) 200 MCG tablet Take 1 tablet (200 mcg total) by mouth daily before breakfast. 30 tablet 0   lidocaine (LIDODERM) 5 % Place 1 patch onto the skin daily. Remove & Discard patch within 12 hours or as directed by MD 30 patch 0   lisinopril (ZESTRIL) 10 MG tablet TAKE 1 TABLET BY MOUTH DAILY 30 tablet 0   meloxicam (MOBIC) 7.5 MG tablet TAKE 1 TABLET BY MOUTH DAILY 30 tablet 0   metFORMIN (GLUCOPHAGE) 1000 MG tablet TAKE 1 TABLET BY MOUTH TWICE DAILY FOR 30 DAYS 60 tablet 3   morphine (MS CONTIN) 30 MG 12 hr tablet Take 1 tablet by mouth 2 (two) times daily. Take with the $Remove'60mg'[id number]$       morphine (MS CONTIN) 60 MG 12 hr tablet Take 1 tablet by mouth 2 (two) times daily. Takes with the $Remove'30mg'[id number]$       NOVOLOG FLEXPEN 100 UNIT/ML FlexPen INJECT 0-20 UNITS FOUR TIMES DAILY BEFORE MEALS & AT BEDTIME. FOR BLOOD SUGAR AT MEALS 70-139= NO INSULIN. 140-180=7 UNITS. 181-240=9 UNITS. 241-300=12 UNITS. 301-350=14 UNITS. 351-400=17 UNITS. >400=20 UNITS AND CALL DR. AT BEDTIME: >200=5 UNITS 15 mL 5   Omega-3 Fatty Acids (FISH OIL) 1000 MG CAPS Take 2 capsules (2,000 mg total) by mouth in the morning and at bedtime. 360 capsule 3   ONETOUCH ULTRA test strip USE UP TO FOUR TIMES DAILY AS DIRECTED (25 DAY supply) 100 strip 11   simvastatin (ZOCOR) 40 MG tablet TAKE 1 TABLET BY MOUTH EVERY EVENING 30 tablet 3   TRESIBA FLEXTOUCH 200 UNIT/ML FlexTouch Pen INJECT  50-52 UNITS INTO THE SKIN AT BEDTIME 9 mL 0    glipiZIDE (GLUCOTROL XL) 10 MG 24 hr tablet TAKE 1 TABLET BY MOUTH DAILY WITH BREAKFAST 30 tablet 0   No facility-administered medications prior to visit.    Allergies  Allergen Reactions   Codeine Other (See Comments)    Slurred Speech    Other     [place], sweat potatoes,    Penicillins Rash    ROS Review of Systems  Constitutional: Negative.   Respiratory: Negative.    Cardiovascular: Negative.   Musculoskeletal:  Positive for arthralgias and back pain.       This is chronic for him  Psychiatric/Behavioral:  Positive for sleep disturbance. Negative for self-injury and suicidal ideas.      Objective:    Physical Exam Constitutional:      Appearance: Normal appearance. He is obese.  Cardiovascular:     Rate and Rhythm: Normal rate and regular rhythm.     Pulses: Normal pulses.     Heart sounds: Normal heart sounds.  Pulmonary:     Effort: Pulmonary effort is normal.     Breath sounds: Normal breath sounds.  Musculoskeletal:        General: Normal range of motion.  Neurological:     Mental Status: He is alert.  Psychiatric:        Mood and Affect: Mood normal.        Behavior: Behavior normal.    BP 115/71 (BP Location: Right Arm, Patient Position: Sitting, Cuff Size: Large)   Pulse 88   Temp 99.4 F (37.4 C)   Resp 20   Ht 5\' 8"  (1.727 m)   Wt 248 lb (112.5 kg)   SpO2 95%   BMI 37.71 kg/m  Wt Readings from Last 3 Encounters:  08/01/20 248 lb (112.5 kg)  04/17/20 249 lb (112.9 kg)  04/03/20 252 lb (114.3 kg)     Health Maintenance Due  Topic Date Due   PNEUMOCOCCAL POLYSACCHARIDE VACCINE AGE 73-64 HIGH RISK  Never done   OPHTHALMOLOGY EXAM  Never done   COLONOSCOPY (Pts 45-33yrs Insurance coverage will need to be confirmed)  Never done   Zoster Vaccines- Shingrix (1 of 2) Never done   COVID-19 Vaccine (3 - Booster for Moderna series) 03/19/2020   FOOT EXAM  05/23/2020    There are no preventive care reminders to display for this patient.  Lab  Results  Component Value Date   TSH 1.430 03/07/2020   Lab Results  Component Value Date   WBC 6.1 04/29/2020   HGB 14.9 04/29/2020   HCT 45.4 04/29/2020   MCV 86 04/29/2020   PLT 184 04/29/2020   Lab Results  Component Value Date   NA 136 04/29/2020   K 4.7 04/29/2020   CO2 18 (L) 04/29/2020   GLUCOSE 224 (H) 04/29/2020   BUN 18 04/29/2020   CREATININE 0.99 04/29/2020   BILITOT 0.4 04/29/2020   ALKPHOS 73 04/29/2020   AST 24 04/29/2020   ALT 38 04/29/2020   PROT 7.0 04/29/2020   ALBUMIN 4.5 04/29/2020   CALCIUM 9.8 04/29/2020   ANIONGAP 7 03/22/2020   EGFR 87 04/29/2020   Lab Results  Component Value Date   CHOL 144 04/29/2020   Lab Results  Component Value Date   HDL 38 (L) 04/29/2020   Lab Results  Component Value Date   LDLCALC 76 04/29/2020   Lab Results  Component Value Date   TRIG 173 (H) 04/29/2020  Lab Results  Component Value Date   CHOLHDL 3.7 02/05/2017   Lab Results  Component Value Date   HGBA1C 10.4 (H) 04/29/2020      Assessment & Plan:   Problem List Items Addressed This Visit       Cardiovascular and Mediastinum   Essential hypertension    BP Readings from Last 3 Encounters:  08/01/20 115/71  04/17/20 125/80  04/03/20 136/80  -well controlled       Relevant Orders   CBC with Differential/Platelet   CMP14+EGFR   Lipid Panel With LDL/HDL Ratio     Endocrine   Diabetes mellitus type 2, insulin dependent ([place])    Lab Results  Component Value Date   HGBA1C 10.4 (H) 04/29/2020  -poor dietary compliance -he refused endocrinology d/t copay -taking metformin 1000 mg BID -taking novolog per custom dose sliding scale -on lisinopril and simvastatin -stopped glipizide when he was started on insulin -has been taking levemir to 35 units BID -checking A1c today       Relevant Orders   Lipid Panel With LDL/HDL Ratio   Hemoglobin A1c   Hyperlipidemia associated with type 2 diabetes mellitus ([place])    -checking labs        Hypothyroidism    -checking TSH and T4       Relevant Orders   TSH + free T4     Other   Insomnia, unspecified    -he was asking about trying a pill with MJ in it that he would test positive on drug tests, but he didn't know the name -Rx. Trazodone        Relevant Medications   traZODone (DESYREL) 50 MG tablet   Erectile dysfunction - Primary    -tried viagra in the past and he felt like his heart was going to come out of his chest and he had headaches -referral to urology       Relevant Orders   Ambulatory referral to Urology    Meds ordered this encounter  Medications   traZODone (DESYREL) 50 MG tablet    Sig: Take 0.5-1 tablets (25-50 mg total) by mouth at bedtime as needed for sleep.    Dispense:  30 tablet    Refill:  3     Follow-up: Return in about 3 months (around 11/01/2020) for Lab follow-up (DM, HLD. HTN) (same-day fasting labs).    Noreene Larsson, NP

## 2020-08-01 NOTE — Assessment & Plan Note (Signed)
BP Readings from Last 3 Encounters:  08/01/20 115/71  04/17/20 125/80  04/03/20 136/80   -well controlled

## 2020-08-01 NOTE — Assessment & Plan Note (Signed)
-  checking TSH and T4

## 2020-08-01 NOTE — Patient Instructions (Signed)
Please have fasting labs drawn this week. 

## 2020-08-01 NOTE — Assessment & Plan Note (Signed)
-  checking labs 

## 2020-08-01 NOTE — Assessment & Plan Note (Signed)
-  he was asking about trying a pill with MJ in it that he would test positive on drug tests, but he didn't know the name -Rx. Trazodone

## 2020-08-02 ENCOUNTER — Encounter: Payer: Self-pay | Admitting: *Deleted

## 2020-08-02 DIAGNOSIS — E119 Type 2 diabetes mellitus without complications: Secondary | ICD-10-CM | POA: Diagnosis not present

## 2020-08-02 DIAGNOSIS — E039 Hypothyroidism, unspecified: Secondary | ICD-10-CM | POA: Diagnosis not present

## 2020-08-02 DIAGNOSIS — I10 Essential (primary) hypertension: Secondary | ICD-10-CM | POA: Diagnosis not present

## 2020-08-02 DIAGNOSIS — Z794 Long term (current) use of insulin: Secondary | ICD-10-CM | POA: Diagnosis not present

## 2020-08-03 LAB — CMP14+EGFR
ALT: 72 IU/L — ABNORMAL HIGH (ref 0–44)
AST: 46 IU/L — ABNORMAL HIGH (ref 0–40)
Albumin/Globulin Ratio: 2.3 — ABNORMAL HIGH (ref 1.2–2.2)
Albumin: 5.1 g/dL — ABNORMAL HIGH (ref 3.8–4.8)
Alkaline Phosphatase: 66 IU/L (ref 44–121)
BUN/Creatinine Ratio: 20 (ref 10–24)
BUN: 17 mg/dL (ref 8–27)
Bilirubin Total: 0.6 mg/dL (ref 0.0–1.2)
CO2: 21 mmol/L (ref 20–29)
Calcium: 9.9 mg/dL (ref 8.6–10.2)
Chloride: 99 mmol/L (ref 96–106)
Creatinine, Ser: 0.84 mg/dL (ref 0.76–1.27)
Globulin, Total: 2.2 g/dL (ref 1.5–4.5)
Glucose: 234 mg/dL — ABNORMAL HIGH (ref 65–99)
Potassium: 4.5 mmol/L (ref 3.5–5.2)
Sodium: 138 mmol/L (ref 134–144)
Total Protein: 7.3 g/dL (ref 6.0–8.5)
eGFR: 99 mL/min/{1.73_m2} (ref >59)

## 2020-08-03 LAB — CBC WITH DIFFERENTIAL/PLATELET
Basophils Absolute: 0.1 10*3/uL (ref 0.0–0.2)
Basos: 1 %
EOS (ABSOLUTE): 0.6 10*3/uL — ABNORMAL HIGH (ref 0.0–0.4)
Eos: 8 %
Hematocrit: 46.5 % (ref 37.5–51.0)
Hemoglobin: 15.4 g/dL (ref 13.0–17.7)
Immature Grans (Abs): 0 10*3/uL (ref 0.0–0.1)
Immature Granulocytes: 0 %
Lymphocytes Absolute: 2.6 10*3/uL (ref 0.7–3.1)
Lymphs: 35 %
MCH: 28.6 pg (ref 26.6–33.0)
MCHC: 33.1 g/dL (ref 31.5–35.7)
MCV: 86 fL (ref 79–97)
Monocytes Absolute: 0.7 10*3/uL (ref 0.1–0.9)
Monocytes: 10 %
Neutrophils Absolute: 3.5 10*3/uL (ref 1.4–7.0)
Neutrophils: 46 %
Platelets: 206 10*3/uL (ref 150–450)
RBC: 5.38 x10E6/uL (ref 4.14–5.80)
RDW: 14.6 % (ref 11.6–15.4)
WBC: 7.5 10*3/uL (ref 3.4–10.8)

## 2020-08-03 LAB — LIPID PANEL WITH LDL/HDL RATIO
Cholesterol, Total: 153 mg/dL (ref 100–199)
HDL: 41 mg/dL (ref >39)
LDL Chol Calc (NIH): 84 mg/dL (ref 0–99)
LDL/HDL Ratio: 2 ratio (ref 0.0–3.6)
Triglycerides: 159 mg/dL — ABNORMAL HIGH (ref 0–149)
VLDL Cholesterol Cal: 28 mg/dL (ref 5–40)

## 2020-08-03 LAB — HEMOGLOBIN A1C
Est. average glucose Bld gHb Est-mCnc: 197 mg/dL
Hgb A1c MFr Bld: 8.5 % — ABNORMAL HIGH (ref 4.8–5.6)

## 2020-08-03 LAB — TSH+FREE T4
Free T4: 1.35 ng/dL (ref 0.82–1.77)
TSH: 2.19 u[IU]/mL (ref 0.450–4.500)

## 2020-08-05 NOTE — Progress Notes (Signed)
Cholesterol is much improved, and A1c is down to 8.5 from 10.4.  We want to get that A1c down under 7, but I'm happy with a 2-point drop.    Liver enzymes are a little elevated, but we will monitor those with routine labs.

## 2020-08-07 DIAGNOSIS — G4733 Obstructive sleep apnea (adult) (pediatric): Secondary | ICD-10-CM | POA: Diagnosis not present

## 2020-08-08 ENCOUNTER — Other Ambulatory Visit: Payer: Self-pay | Admitting: Nurse Practitioner

## 2020-08-08 DIAGNOSIS — I10 Essential (primary) hypertension: Secondary | ICD-10-CM

## 2020-08-08 DIAGNOSIS — M1250 Traumatic arthropathy, unspecified site: Secondary | ICD-10-CM

## 2020-08-13 ENCOUNTER — Telehealth: Payer: Self-pay

## 2020-08-13 DIAGNOSIS — M25512 Pain in left shoulder: Secondary | ICD-10-CM | POA: Diagnosis not present

## 2020-08-13 DIAGNOSIS — Z79899 Other long term (current) drug therapy: Secondary | ICD-10-CM | POA: Diagnosis not present

## 2020-08-13 DIAGNOSIS — E1342 Other specified diabetes mellitus with diabetic polyneuropathy: Secondary | ICD-10-CM | POA: Diagnosis not present

## 2020-08-13 DIAGNOSIS — G894 Chronic pain syndrome: Secondary | ICD-10-CM | POA: Diagnosis not present

## 2020-08-13 NOTE — Telephone Encounter (Signed)
Patient called said his pain doctor does not prescribe Marinol medicine, said this would help his leg pain. Pharmacy: [facility] Contact # [phone].

## 2020-08-14 NOTE — Telephone Encounter (Signed)
Pt says his pain doctor said this is something that would need to be prescribed by his pcp. He has never been on this before but would like to try it. Please advise.

## 2020-08-14 NOTE — Telephone Encounter (Signed)
Pt informed

## 2020-08-14 NOTE — Telephone Encounter (Signed)
That is a schedule 3 controlled substance and is not a medication that I prescribe.

## 2020-09-02 DIAGNOSIS — M4722 Other spondylosis with radiculopathy, cervical region: Secondary | ICD-10-CM | POA: Diagnosis not present

## 2020-09-02 DIAGNOSIS — G894 Chronic pain syndrome: Secondary | ICD-10-CM | POA: Diagnosis not present

## 2020-09-02 DIAGNOSIS — G5601 Carpal tunnel syndrome, right upper limb: Secondary | ICD-10-CM | POA: Diagnosis not present

## 2020-09-06 DIAGNOSIS — G4733 Obstructive sleep apnea (adult) (pediatric): Secondary | ICD-10-CM | POA: Diagnosis not present

## 2020-09-08 ENCOUNTER — Other Ambulatory Visit: Payer: Self-pay | Admitting: Nurse Practitioner

## 2020-09-08 DIAGNOSIS — I10 Essential (primary) hypertension: Secondary | ICD-10-CM

## 2020-09-08 DIAGNOSIS — M1250 Traumatic arthropathy, unspecified site: Secondary | ICD-10-CM

## 2020-09-11 ENCOUNTER — Other Ambulatory Visit: Payer: Self-pay | Admitting: *Deleted

## 2020-09-11 MED ORDER — SIMVASTATIN 40 MG PO TABS: ORAL_TABLET | ORAL | 3 refills | Status: DC

## 2020-09-17 ENCOUNTER — Encounter: Payer: Self-pay | Admitting: Nurse Practitioner

## 2020-09-17 ENCOUNTER — Ambulatory Visit: Payer: Medicare HMO

## 2020-09-17 ENCOUNTER — Telehealth (INDEPENDENT_AMBULATORY_CARE_PROVIDER_SITE_OTHER): Payer: Medicare HMO | Admitting: Nurse Practitioner

## 2020-09-17 ENCOUNTER — Ambulatory Visit: Payer: Medicare HMO | Admitting: Nurse Practitioner

## 2020-09-17 ENCOUNTER — Other Ambulatory Visit: Payer: Self-pay

## 2020-09-17 DIAGNOSIS — J069 Acute upper respiratory infection, unspecified: Secondary | ICD-10-CM | POA: Insufficient documentation

## 2020-09-17 MED ORDER — ALBUTEROL SULFATE HFA 108 (90 BASE) MCG/ACT IN AERS: 2.0000 | INHALATION_SPRAY | Freq: Four times a day (QID) | RESPIRATORY_TRACT | 0 refills | Status: DC | PRN

## 2020-09-17 MED ORDER — NOREL AD 4-10-325 MG PO TABS: 1.0000 | ORAL_TABLET | ORAL | 1 refills | Status: DC | PRN

## 2020-09-17 NOTE — Progress Notes (Signed)
Acute Office Visit  Subjective:    Patient ID: Thomas Rollins, male    DOB: 16-Mar-1960, 60 y.o.   MRN: [id number]  Chief Complaint  Patient presents with   Cough    Ongoing x2-3 days. Productive at times.    Cough Associated symptoms include chills, a fever, rhinorrhea, shortness of breath and wheezing. Pertinent negatives include no sore throat.  Patient is in today for sick visit.   Past Medical History:  Diagnosis Date   Anxiety    panic attacks   Chronic pain    Depression    Diabetes mellitus without complication (HCC)    Encounter for hepatitis C screening test for low risk patient 04/05/2019   Hyperlipidemia    Hypertension     Past Surgical History:  Procedure Laterality Date   ankle sx     ARTHROSCOPIC REPAIR ACL     CARPAL TUNNEL RELEASE     HERNIA REPAIR     SHOULDER ARTHROSCOPY      Family History  Problem Relation Age of Onset   Stomach cancer Mother    Heart Problems Father    Dementia Father    Cancer Father    Bone cancer Brother    Lung cancer Brother    Cancer Daughter     Social History   Socioeconomic History   Marital status: Divorced    Spouse name: Not on file   Number of children: 1   Years of education: Not on file   Highest education level: 12th grade  Occupational History   Not on file  Tobacco Use   Smoking status: Former    Types: Cigarettes   Smokeless tobacco: Never  Vaping Use   Vaping Use: Never used  Substance and Sexual Activity   Alcohol use: Yes    Comment: occ   Drug use: Yes    Types: Marijuana    Comment: 1-2 times a year    Sexual activity: Never  Other Topics Concern   Not on file  Social History Narrative   Lives with good friend of his, known for 30 years    Only daughter passed away from cancer in 11/02/2016       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   Takes time release morphine b/c fell 15-20 feet from a building while working.    Now disabled        Exercises/walks: 7 days a week    Has pets at home: 5 dogs in the home.      Diet: Avoids white foods, fruits, veggies, does eat some fast food/eat out    Caffeine: none only hot chocolate or soda when stomach is upset   Water: 8 cups daily if not more      Wears seat belt   Smoke detectors and carbon monoxide detectors at home    Does not use phone while driving                      Social Determinants of Corporate investment banker Strain: Low Risk    Difficulty of Paying Living Expenses: Not very hard  Food Insecurity: No Food Insecurity   Worried About Programme researcher, broadcasting/film/video in the Last Year: Never true   Ran Out of Food in the Last Year: Never true  Transportation Needs: No Transportation Needs   Lack of Transportation (Medical): No   Lack of Transportation (Non-Medical): No  Physical Activity: Sufficiently Active   Days  of Exercise per Week: 4 days   Minutes of Exercise per Session: 40 min  Stress: Stress Concern Present   Feeling of Stress : To some extent  Social Connections: Socially Isolated   Frequency of Communication with Friends and Family: Twice a week   Frequency of Social Gatherings with Friends and Family: Never   Attends Religious Services: Never   Printmaker: No   Attends Music therapist: Never   Marital Status: Divorced  Human resources officer Violence: Not At Risk   Fear of Current or Ex-Partner: No   Emotionally Abused: No   Physically Abused: No   Sexually Abused: No    Outpatient Medications Prior to Visit  Medication Sig Dispense Refill   ALPHAGAN P 0.1 % SOLN Apply 1 drop to eye 2 (two) times daily.     blood glucose meter kit and supplies Dispense based on patient and insurance preference. Use up to four times daily as directed. (FOR ICD-10 E10.9, E11.9). 1 each 0   DULoxetine (CYMBALTA) 60 MG capsule TAKE 1 CAPSULE BY MOUTH EVERY DAY 90 capsule 0   fluticasone (FLONASE) 50 MCG/ACT nasal spray   0   gabapentin (NEURONTIN) 300 MG capsule  TAKE ONE CAPSULE BY MOUTH TWICE DAILY 60 capsule 2   GLOBAL EASE INJECT PEN NEEDLES 31G X 8 MM MISC 1 each by Other route as directed.     insulin detemir (LEVEMIR) 100 UNIT/ML FlexPen Inject 35 Units into the skin 2 (two) times daily. 15 mL 11   levothyroxine (SYNTHROID) 125 MCG tablet TAKE 1 TABLET BY MOUTH EVERY MORNING ON AN EMPTY STOMACH 30 tablet 2   levothyroxine (SYNTHROID) 200 MCG tablet Take 1 tablet (200 mcg total) by mouth daily before breakfast. 30 tablet 0   lidocaine (LIDODERM) 5 % Place 1 patch onto the skin daily. Remove & Discard patch within 12 hours or as directed by MD 30 patch 0   lisinopril (ZESTRIL) 10 MG tablet TAKE 1 TABLET BY MOUTH DAILY 30 tablet 0   meloxicam (MOBIC) 7.5 MG tablet TAKE 1 TABLET BY MOUTH DAILY 30 tablet 0   metFORMIN (GLUCOPHAGE) 1000 MG tablet TAKE 1 TABLET BY MOUTH TWICE DAILY FOR 30 DAYS 60 tablet 3   morphine (MS CONTIN) 30 MG 12 hr tablet Take 1 tablet by mouth 2 (two) times daily. Take with the $Remove'60mg'sylydGM$       morphine (MS CONTIN) 60 MG 12 hr tablet Take 1 tablet by mouth 2 (two) times daily. Takes with the $Remove'30mg'YhuBdUW$       NOVOLOG FLEXPEN 100 UNIT/ML FlexPen INJECT 0-20 UNITS FOUR TIMES DAILY BEFORE MEALS & AT BEDTIME. FOR BLOOD SUGAR AT MEALS 70-139= NO INSULIN. 140-180=7 UNITS. 181-240=9 UNITS. 241-300=12 UNITS. 301-350=14 UNITS. 351-400=17 UNITS. >400=20 UNITS AND CALL DR. AT BEDTIME: >200=5 UNITS 15 mL 5   Omega-3 Fatty Acids (FISH OIL) 1000 MG CAPS Take 2 capsules (2,000 mg total) by mouth in the morning and at bedtime. 360 capsule 3   ONETOUCH ULTRA test strip USE UP TO FOUR TIMES DAILY AS DIRECTED (25 DAY supply) 100 strip 11   simvastatin (ZOCOR) 40 MG tablet TAKE 1 TABLET BY MOUTH EVERY EVENING 30 tablet 3   traZODone (DESYREL) 50 MG tablet Take 0.5-1 tablets (25-50 mg total) by mouth at bedtime as needed for sleep. 30 tablet 3   TRESIBA FLEXTOUCH 200 UNIT/ML FlexTouch Pen INJECT 50-52 UNITS INTO THE SKIN AT BEDTIME 9 mL 0   No facility-administered  medications prior  to visit.    Allergies  Allergen Reactions   Codeine Other (See Comments)    Slurred Speech    Other     [place], sweat potatoes,    Penicillins Rash    Review of Systems  Constitutional:  Positive for chills and fever. Negative for fatigue.  HENT:  Positive for congestion and rhinorrhea. Negative for sinus pressure, sinus pain and sore throat.   Respiratory:  Positive for cough, chest tightness, shortness of breath and wheezing.        Productive  Cardiovascular: Negative.       Objective:    Physical Exam  There were no vitals taken for this visit. Wt Readings from Last 3 Encounters:  08/01/20 248 lb (112.5 kg)  04/17/20 249 lb (112.9 kg)  04/03/20 252 lb (114.3 kg)    Health Maintenance Due  Topic Date Due   PNEUMOCOCCAL POLYSACCHARIDE VACCINE AGE 78-64 HIGH RISK  Never done   OPHTHALMOLOGY EXAM  Never done   COLONOSCOPY (Pts 45-93yrs Insurance coverage will need to be confirmed)  Never done   Zoster Vaccines- Shingrix (1 of 2) Never done   COVID-19 Vaccine (3 - Booster for Moderna series) 03/19/2020   FOOT EXAM  05/23/2020    There are no preventive care reminders to display for this patient.   Lab Results  Component Value Date   TSH 2.190 08/02/2020   Lab Results  Component Value Date   WBC 7.5 08/02/2020   HGB 15.4 08/02/2020   HCT 46.5 08/02/2020   MCV 86 08/02/2020   PLT 206 08/02/2020   Lab Results  Component Value Date   NA 138 08/02/2020   K 4.5 08/02/2020   CO2 21 08/02/2020   GLUCOSE 234 (H) 08/02/2020   BUN 17 08/02/2020   CREATININE 0.84 08/02/2020   BILITOT 0.6 08/02/2020   ALKPHOS 66 08/02/2020   AST 46 (H) 08/02/2020   ALT 72 (H) 08/02/2020   PROT 7.3 08/02/2020   ALBUMIN 5.1 (H) 08/02/2020   CALCIUM 9.9 08/02/2020   ANIONGAP 7 03/22/2020   EGFR 99 08/02/2020   Lab Results  Component Value Date   CHOL 153 08/02/2020   Lab Results  Component Value Date   HDL 41 08/02/2020   Lab Results  Component Value  Date   LDLCALC 84 08/02/2020   Lab Results  Component Value Date   TRIG 159 (H) 08/02/2020   Lab Results  Component Value Date   CHOLHDL 3.7 02/05/2017   Lab Results  Component Value Date   HGBA1C 8.5 (H) 08/02/2020       Assessment & Plan:   Problem List Items Addressed This Visit       Respiratory   URI (upper respiratory infection) - Primary    -cough started 2 days ago -likely viral, will test for COVID -Rx. norel and albuterol (has been having wheezing) -if no improvement by Thursday/Friday Am will consider abx       Relevant Medications   Chlorphen-PE-Acetaminophen (NOREL AD) 4-10-325 MG TABS   albuterol (VENTOLIN HFA) 108 (90 Base) MCG/ACT inhaler   Other Relevant Orders   Novel Coronavirus, NAA ([facility])     Meds ordered this encounter  Medications   Chlorphen-PE-Acetaminophen (NOREL AD) 4-10-325 MG TABS    Sig: Take 1 tablet by mouth every 4 (four) hours as needed (nasal congestion, cold symptoms).    Dispense:  20 tablet    Refill:  1   albuterol (VENTOLIN HFA) 108 (90 Base) MCG/ACT inhaler  Sig: Inhale 2 puffs into the lungs every 6 (six) hours as needed for wheezing or shortness of breath.    Dispense:  8 g    Refill:  0    Date:  09/17/2020   Location of Patient: Home Location of Provider: Office Consent was obtain for visit to be over via telehealth. I verified that I am speaking with the correct person using two identifiers.  I connected with  Oluwaseyi Raffel on 09/17/20 via telephone and verified that I am speaking with the correct person using two identifiers.   I discussed the limitations of evaluation and management by telemedicine. The patient expressed understanding and agreed to proceed.  Time spent: 8 minutes   Noreene Larsson, NP

## 2020-09-17 NOTE — Assessment & Plan Note (Signed)
-  cough started 2 days ago -likely viral, will test for COVID -Rx. norel and albuterol (has been having wheezing) -if no improvement by Thursday/Friday Am will consider abx

## 2020-09-17 NOTE — Patient Instructions (Signed)
He will come for COVID testing at 4 pm.

## 2020-09-19 LAB — NOVEL CORONAVIRUS, NAA: SARS-CoV-2, NAA: NOT DETECTED

## 2020-09-19 LAB — SARS-COV-2, NAA 2 DAY TAT

## 2020-09-19 NOTE — Progress Notes (Signed)
COVID test was negative.

## 2020-09-23 ENCOUNTER — Other Ambulatory Visit: Payer: Self-pay | Admitting: Nurse Practitioner

## 2020-09-24 ENCOUNTER — Ambulatory Visit: Payer: Medicare HMO | Admitting: Urology

## 2020-09-24 ENCOUNTER — Encounter: Payer: Self-pay | Admitting: Urology

## 2020-09-24 ENCOUNTER — Other Ambulatory Visit: Payer: Self-pay

## 2020-09-24 VITALS — BP 124/78 | HR 102

## 2020-09-24 DIAGNOSIS — N5201 Erectile dysfunction due to arterial insufficiency: Secondary | ICD-10-CM | POA: Diagnosis not present

## 2020-09-24 MED ORDER — TADALAFIL 20 MG PO TABS
20.0000 mg | ORAL_TABLET | ORAL | 11 refills | Status: DC | PRN
Start: 2020-09-24 — End: 2020-09-24

## 2020-09-24 MED ORDER — TADALAFIL 20 MG PO TABS: 20.0000 mg | ORAL_TABLET | ORAL | 11 refills | Status: DC | PRN

## 2020-09-24 NOTE — Progress Notes (Signed)
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  He has chronic pain.  He is on multiple medications for this including high-dose morphine, meloxicam, cyclobenzaprine, gabapentin.  He is also hypertensive and diabetic.  He has had issues with the ED for 2 to 3 years.  He is able to obtain an orgasm, but not to obtain an erection.  He has tried sildenafil in the past that caused his heart to race.  He has not tried tadalafil.    Past Medical History:  Diagnosis Date   Anxiety    panic attacks   Chronic pain    Depression    Diabetes mellitus without complication ([facility])    Encounter for hepatitis C screening test for low risk patient 04/05/2019   Hyperlipidemia    Hypertension     Past Surgical History:  Procedure Laterality Date   ankle sx     ARTHROSCOPIC REPAIR ACL     CARPAL TUNNEL RELEASE     HERNIA REPAIR     SHOULDER ARTHROSCOPY      Home Medications:  Allergies as of 09/24/2020       Reactions   Codeine Other (See Comments)   Slurred Speech    Other    [place], sweat potatoes,    Penicillins Rash        Medication List        Accurate as of September 24, 2020  1:06 PM. If you have any questions, ask your nurse or doctor.          albuterol 108 (90 Base) MCG/ACT inhaler Commonly known as: VENTOLIN HFA Inhale 2 puffs into the lungs every 6 (six) hours as needed for wheezing or shortness of breath.   Alphagan P 0.1 % Soln Generic drug: brimonidine Apply 1 drop to eye 2 (two) times daily.   blood glucose meter kit and supplies Dispense based on patient and insurance preference. Use up to four times daily as directed. (FOR ICD-10 E10.9, E11.9).   DULoxetine 60 MG capsule Commonly known as: CYMBALTA TAKE 1 CAPSULE BY MOUTH EVERY DAY   Fish Oil 1000 MG Caps Take 2 capsules (2,000 mg total) by mouth in the  morning and at bedtime.   fluticasone 50 MCG/ACT nasal spray Commonly known as: FLONASE   gabapentin 300 MG capsule Commonly known as: NEURONTIN TAKE ONE CAPSULE BY MOUTH TWICE DAILY   Global Ease Inject Pen Needles 31G X 8 MM Misc Generic drug: Insulin Pen Needle 1 each by Other route as directed.   insulin detemir 100 UNIT/ML FlexPen Commonly known as: LEVEMIR Inject 35 Units into the skin 2 (two) times daily.   levothyroxine 200 MCG tablet Commonly known as: Synthroid Take 1 tablet (200 mcg total) by mouth daily before breakfast.   levothyroxine 125 MCG tablet Commonly known as: SYNTHROID TAKE 1 TABLET BY MOUTH EVERY MORNING ON AN EMPTY STOMACH   lidocaine 5 % Commonly known as: Lidoderm Place 1 patch onto the skin daily. Remove & Discard patch within 12 hours or as directed by MD   lisinopril 10 MG tablet Commonly known as: ZESTRIL TAKE 1 TABLET BY MOUTH DAILY   meloxicam 7.5 MG tablet Commonly known as: MOBIC TAKE 1 TABLET BY MOUTH DAILY   metFORMIN 1000 MG tablet Commonly known as: GLUCOPHAGE TAKE 1 TABLET BY MOUTH TWICE DAILY FOR 30 DAYS   morphine 30 MG 12 hr tablet Commonly  known as: MS CONTIN Take 1 tablet by mouth 2 (two) times daily. Take with the $Remove'60mg'[id number]$    morphine 60 MG 12 hr tablet Commonly known as: MS CONTIN Take 1 tablet by mouth 2 (two) times daily. Takes with the $Remove'30mg'vqpjCON$    Norel AD 4-10-325 MG Tabs Generic drug: Chlorphen-PE-Acetaminophen Take 1 tablet by mouth every 4 (four) hours as needed (nasal congestion, cold symptoms).   NovoLOG FlexPen 100 UNIT/ML FlexPen Generic drug: insulin aspart INJECT 0-20 UNITS FOUR TIMES DAILY BEFORE MEALS & AT BEDTIME. FOR BLOOD SUGAR AT MEALS 70-139= NO INSULIN. 140-180=7 UNITS. 181-240=9 UNITS. 241-300=12 UNITS. 301-350=14 UNITS. 351-400=17 UNITS. >400=20 UNITS AND CALL DR. AT BEDTIME: >200=5 UNITS   OneTouch Ultra test strip Generic drug: glucose blood USE UP TO FOUR TIMES DAILY AS DIRECTED (25 DAY supply)    simvastatin 40 MG tablet Commonly known as: ZOCOR TAKE 1 TABLET BY MOUTH EVERY EVENING   traZODone 50 MG tablet Commonly known as: DESYREL Take 0.5-1 tablets (25-50 mg total) by mouth at bedtime as needed for sleep.   [name] FlexTouch 200 UNIT/ML FlexTouch Pen Generic drug: insulin degludec INJECT 50-52 UNITS INTO THE SKIN AT BEDTIME        Allergies:  Allergies  Allergen Reactions   Codeine Other (See Comments)    Slurred Speech    Other     [place], sweat potatoes,    Penicillins Rash    Family History  Problem Relation Age of Onset   Stomach cancer Mother    Heart Problems Father    Dementia Father    Cancer Father    Bone cancer Brother    Lung cancer Brother    Cancer Daughter     Social History:  reports that he has quit smoking. His smoking use included cigarettes. He has never used smokeless tobacco. He reports current alcohol use. He reports current drug use. Drug: Marijuana.  ROS: A complete review of systems was performed.  All systems are negative except for pertinent findings as noted.  Physical Exam:  Vital signs in last 24 hours: There were no vitals taken for this visit. Constitutional:  Alert and oriented, No acute distress.  Morbidly obese Cardiovascular: Regular rate  Respiratory: Normal respiratory effort GI: Abdomen is soft, nontender, nondistended, no abdominal masses. No CVAT.  No inguinal hernias Genitourinary: Normal male phallus, testes are descended bilaterally and non-tender and without masses, scrotum is normal in appearance without lesions or masses, perineum is normal on inspection.  Slightly decreased anal sphincter tone.  Prostate 30 g, symmetric, nontender. Lymphatic: No lymphadenopathy Neurologic: Grossly intact, no focal deficits Psychiatric: Normal mood and affect  I have reviewed prior pt notes  I have reviewed notes from referring/previous physicians  I have reviewed urinalysis results     Impression/Assessment:   Erectile dysfunction, and diabetic hypertensive male with morbid obesity and on high-dose opioids  Plan:  1.  I did recommend that he practice a heart healthy lifestyle and lose weight  2.  We will try tadalafil 20 mg-1/2-1 p.o. as needed  3.  I will bring him back in about 3 months to see how he is doing with this.

## 2020-09-24 NOTE — Progress Notes (Signed)
Urological Symptom Review  Patient is experiencing the following symptoms: Erection problems (male only)   Review of Systems  Gastrointestinal (upper)  : Negative for upper GI symptoms  Gastrointestinal (lower) : Negative for lower GI symptoms  Constitutional : Negative for symptoms  Skin: Negative for skin symptoms  Eyes: Negative for eye symptoms  Ear/Nose/Throat : Negative for Ear/Nose/Throat symptoms  Hematologic/Lymphatic: Negative for Hematologic/Lymphatic symptoms  Cardiovascular : Negative for cardiovascular symptoms  Respiratory : Negative for respiratory symptoms  Endocrine: Excessive thirst  Musculoskeletal: Back pain Joint pain  Neurological: Negative for neurological symptoms  Psychologic: Depression Anxiety

## 2020-09-26 ENCOUNTER — Telehealth: Payer: Self-pay

## 2020-09-26 DIAGNOSIS — N5201 Erectile dysfunction due to arterial insufficiency: Secondary | ICD-10-CM

## 2020-09-26 NOTE — Telephone Encounter (Signed)
Patient has taken up to 1 1/2 tadalafil (CIALIS) 20 MG tablet With no results.  Wantsd to [facility] what Dr. Delorse Lek suggests for him to do?  Please advise.   Call back: [phone]  Thanks, [name]

## 2020-09-26 NOTE — Telephone Encounter (Signed)
See below

## 2020-10-03 NOTE — Telephone Encounter (Signed)
Patient calling back about previous message. Needing to know what to do about medication.  Please advise.  Thanks, [name]

## 2020-10-07 ENCOUNTER — Other Ambulatory Visit: Payer: Self-pay | Admitting: Nurse Practitioner

## 2020-10-07 DIAGNOSIS — G4733 Obstructive sleep apnea (adult) (pediatric): Secondary | ICD-10-CM | POA: Diagnosis not present

## 2020-10-07 DIAGNOSIS — M792 Neuralgia and neuritis, unspecified: Secondary | ICD-10-CM

## 2020-10-07 DIAGNOSIS — M1250 Traumatic arthropathy, unspecified site: Secondary | ICD-10-CM

## 2020-10-07 DIAGNOSIS — I10 Essential (primary) hypertension: Secondary | ICD-10-CM

## 2020-10-07 NOTE — Telephone Encounter (Signed)
Please check with him to see if he takes levothyroxine 125 or 200 mcg. His med rec is showing 2 different dosages.

## 2020-10-08 ENCOUNTER — Other Ambulatory Visit: Payer: Self-pay | Admitting: Urology

## 2020-10-08 MED ORDER — AMBULATORY NON FORMULARY MEDICATION: 1.0000 | 11 refills | Status: DC | PRN

## 2020-10-08 NOTE — Telephone Encounter (Signed)
Please send in prescription. Patient wish to proceed. Patient scheduled for 10/29/20 for injection teaching.

## 2020-10-08 NOTE — Telephone Encounter (Signed)
Rx sent. Patient made aware to  pick up rx before appointment.

## 2020-10-09 DIAGNOSIS — M25512 Pain in left shoulder: Secondary | ICD-10-CM | POA: Diagnosis not present

## 2020-10-09 DIAGNOSIS — E1140 Type 2 diabetes mellitus with diabetic neuropathy, unspecified: Secondary | ICD-10-CM | POA: Diagnosis not present

## 2020-10-17 ENCOUNTER — Telehealth: Payer: Self-pay | Admitting: Nurse Practitioner

## 2020-10-17 ENCOUNTER — Other Ambulatory Visit: Payer: Self-pay

## 2020-10-17 ENCOUNTER — Ambulatory Visit (INDEPENDENT_AMBULATORY_CARE_PROVIDER_SITE_OTHER): Payer: Medicare HMO | Admitting: Nurse Practitioner

## 2020-10-17 ENCOUNTER — Encounter: Payer: Self-pay | Admitting: Nurse Practitioner

## 2020-10-17 VITALS — BP 126/76 | HR 84 | Ht 68.0 in | Wt 243.0 lb

## 2020-10-17 DIAGNOSIS — E039 Hypothyroidism, unspecified: Secondary | ICD-10-CM

## 2020-10-17 DIAGNOSIS — F419 Anxiety disorder, unspecified: Secondary | ICD-10-CM | POA: Diagnosis not present

## 2020-10-17 DIAGNOSIS — Z794 Long term (current) use of insulin: Secondary | ICD-10-CM

## 2020-10-17 DIAGNOSIS — I10 Essential (primary) hypertension: Secondary | ICD-10-CM | POA: Diagnosis not present

## 2020-10-17 DIAGNOSIS — E119 Type 2 diabetes mellitus without complications: Secondary | ICD-10-CM

## 2020-10-17 MED ORDER — DEXCOM G6 SENSOR MISC: 11 refills | Status: DC

## 2020-10-17 MED ORDER — DEXCOM G6 TRANSMITTER MISC: 1.0000 | Freq: Once | 0 refills | Status: AC

## 2020-10-17 MED ORDER — TRULICITY 0.75 MG/0.5ML ~~LOC~~ SOAJ
0.7500 mg | SUBCUTANEOUS | 1 refills | Status: DC
Start: 2020-10-17 — End: 2020-11-25

## 2020-10-17 MED ORDER — DEXCOM G6 RECEIVER DEVI: 1.0000 | Freq: Once | 0 refills | Status: AC

## 2020-10-17 NOTE — Assessment & Plan Note (Signed)
Lab Results  Component Value Date   TSH 2.190 08/02/2020   -check TSH+T4 with next set of labs

## 2020-10-17 NOTE — Assessment & Plan Note (Addendum)
BP Readings from Last 3 Encounters:  10/17/20 126/76  09/24/20 124/78  08/01/20 115/71   -well controlled

## 2020-10-17 NOTE — Assessment & Plan Note (Signed)
-  Rx. dexcom -Rx. trulicity -pharmacy referral

## 2020-10-17 NOTE — Progress Notes (Signed)
Acute Office Visit  Subjective:    Patient ID: Thomas Rollins, male    DOB: [birth year], 60 y.o.   MRN: [id number]  Chief Complaint  Patient presents with   Depression    Chronic depression, wants to speak with someone, does not wish to do any televisits.     Depression        Associated symptoms include no suicidal ideas. Patient is in today for back pain and depression.  He states his has to put his dog down and his the date of his daughter's passing is coming up.  He states he is still grieving over his daughter's death.  He is concerned with his blood sugar that has been consistently elevated. He states his fingers are sore from constant finger sticks.  Past Medical History:  Diagnosis Date   Anxiety    panic attacks   Chronic pain    Depression    Diabetes mellitus without complication ([place])    Encounter for hepatitis C screening test for low risk patient 04/05/2019   Hyperlipidemia    Hypertension     Past Surgical History:  Procedure Laterality Date   ankle sx     ARTHROSCOPIC REPAIR ACL     CARPAL TUNNEL RELEASE     HERNIA REPAIR     SHOULDER ARTHROSCOPY      Family History  Problem Relation Age of Onset   Stomach cancer Mother    Heart Problems Father    Dementia Father    Cancer Father    Bone cancer Brother    Lung cancer Brother    Cancer Daughter     Social History   Socioeconomic History   Marital status: Divorced    Spouse name: Not on file   Number of children: 1   Years of education: Not on file   Highest education level: 12th grade  Occupational History   Not on file  Tobacco Use   Smoking status: Former    Types: Cigarettes   Smokeless tobacco: Never  Vaping Use   Vaping Use: Never used  Substance and Sexual Activity   Alcohol use: Yes    Comment: occ   Drug use: Yes    Types: Marijuana    Comment: 1-2 times a year    Sexual activity: Never  Other Topics Concern   Not on file  Social History Narrative   Lives with  good friend of his, known for 92 years    Only daughter passed away from cancer in 11/07/2016       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   Takes time release morphine b/c fell 15-20 feet from a building while working.    Now disabled        Exercises/walks: 7 days a week   Has pets at home: 5 dogs in the home.      Diet: Avoids white foods, fruits, veggies, does eat some fast food/eat out    Caffeine: none only hot chocolate or soda when stomach is upset   Water: 8 cups daily if not more      Wears seat belt   Smoke detectors and carbon monoxide detectors at home    Does not use phone while driving                      Social Determinants of Health   Financial Resource Strain: Low Risk    Difficulty of Paying Living Expenses: Not very hard  Food Insecurity: No Food Insecurity   Worried About Charity fundraiser in the Last Year: Never true   Ran Out of Food in the Last Year: Never true  Transportation Needs: No Transportation Needs   Lack of Transportation (Medical): No   Lack of Transportation (Non-Medical): No  Physical Activity: Sufficiently Active   Days of Exercise per Week: 4 days   Minutes of Exercise per Session: 40 min  Stress: Stress Concern Present   Feeling of Stress : To some extent  Social Connections: Socially Isolated   Frequency of Communication with Friends and Family: Twice a week   Frequency of Social Gatherings with Friends and Family: Never   Attends Religious Services: Never   Printmaker: No   Attends Music therapist: Never   Marital Status: Divorced  Human resources officer Violence: Not At Risk   Fear of Current or Ex-Partner: No   Emotionally Abused: No   Physically Abused: No   Sexually Abused: No    Outpatient Medications Prior to Visit  Medication Sig Dispense Refill   albuterol (VENTOLIN HFA) 108 (90 Base) MCG/ACT inhaler Inhale 2 puffs into the lungs every 6 (six) hours as needed for wheezing or  shortness of breath. 8 g 0   ALPHAGAN P 0.1 % SOLN Apply 1 drop to eye 2 (two) times daily.     AMBULATORY NON FORMULARY MEDICATION 1 vial by Intracavernosal route as needed. Medication Name:  Prostaglandin E1, 10 mcg vials.  Dispense 5 with 11 refills  Inject 1 vial as needed 5 vial 11   blood glucose meter kit and supplies Dispense based on patient and insurance preference. Use up to four times daily as directed. (FOR ICD-10 E10.9, E11.9). 1 each 0   Diclofenac Sodium 3 % GEL Apply 1 g topically 3 (three) times daily.     DULoxetine (CYMBALTA) 60 MG capsule TAKE 1 CAPSULE BY MOUTH EVERY DAY 90 capsule 0   fluticasone (FLONASE) 50 MCG/ACT nasal spray   0   gabapentin (NEURONTIN) 300 MG capsule TAKE ONE CAPSULE BY MOUTH TWICE DAILY (Patient taking differently: daily.) 60 capsule 10   GLOBAL EASE INJECT PEN NEEDLES 31G X 8 MM MISC 1 each by Other route as directed.     insulin detemir (LEVEMIR) 100 UNIT/ML FlexPen Inject 35 Units into the skin 2 (two) times daily. 15 mL 11   levothyroxine (SYNTHROID) 125 MCG tablet TAKE 1 TABLET BY MOUTH EVERY MORNING ON AN EMPTY STOMACH 30 tablet 10   levothyroxine (SYNTHROID) 200 MCG tablet Take 1 tablet (200 mcg total) by mouth daily before breakfast. 30 tablet 0   lidocaine (LIDODERM) 5 % Place 1 patch onto the skin daily. Remove & Discard patch within 12 hours or as directed by MD 30 patch 0   lisinopril (ZESTRIL) 10 MG tablet TAKE 1 TABLET BY MOUTH DAILY 30 tablet 10   meloxicam (MOBIC) 7.5 MG tablet TAKE 1 TABLET BY MOUTH DAILY 30 tablet 10   metFORMIN (GLUCOPHAGE) 1000 MG tablet TAKE 1 TABLET BY MOUTH TWICE DAILY FOR 30 DAYS 60 tablet 3   morphine (MS CONTIN) 30 MG 12 hr tablet Take 1 tablet by mouth 2 (two) times daily. Take with the 47m      morphine (MS CONTIN) 60 MG 12 hr tablet Take 1 tablet by mouth 2 (two) times daily. Takes with the 335m     NOVOLOG FLEXPEN 100 UNIT/ML FlexPen INJECT 0-20 UNITS FOUR TIMES DAILY BEFORE MEALS &  AT BEDTIME. FOR  BLOOD SUGAR AT MEALS 70-139= NO INSULIN. 140-180=7 UNITS. 181-240=9 UNITS. 241-300=12 UNITS. 301-350=14 UNITS. 351-400=17 UNITS. >400=20 UNITS AND CALL DR. AT BEDTIME: >200=5 UNITS 15 mL 5   Omega-3 Fatty Acids (FISH OIL) 1000 MG CAPS Take 2 capsules (2,000 mg total) by mouth in the morning and at bedtime. 360 capsule 3   ONETOUCH ULTRA test strip USE UP TO FOUR TIMES DAILY AS DIRECTED (25 DAY supply) 100 strip 11   simvastatin (ZOCOR) 40 MG tablet TAKE 1 TABLET BY MOUTH EVERY EVENING 30 tablet 3   tadalafil (CIALIS) 20 MG tablet Take 1 tablet (20 mg total) by mouth as needed for erectile dysfunction. 15 tablet 11   tiZANidine (ZANAFLEX) 4 MG tablet Take 4 mg by mouth 3 (three) times daily as needed.     traZODone (DESYREL) 50 MG tablet Take 0.5-1 tablets (25-50 mg total) by mouth at bedtime as needed for sleep. 30 tablet 3   TRESIBA FLEXTOUCH 200 UNIT/ML FlexTouch Pen INJECT 50-52 UNITS INTO THE SKIN AT BEDTIME 9 mL 0   Chlorphen-PE-Acetaminophen (NOREL AD) 4-10-325 MG TABS Take 1 tablet by mouth every 4 (four) hours as needed (nasal congestion, cold symptoms). (Patient not taking: Reported on 10/17/2020) 20 tablet 1   cyclobenzaprine (FLEXERIL) 10 MG tablet Take 10 mg by mouth 3 (three) times daily as needed. (Patient not taking: Reported on 10/17/2020)     No facility-administered medications prior to visit.    Allergies  Allergen Reactions   Codeine Other (See Comments)    Slurred Speech    Other     [place], sweat potatoes,    Penicillins Rash    Review of Systems  Constitutional: Negative.   Respiratory: Negative.    Cardiovascular: Negative.   Psychiatric/Behavioral:  Positive for depression and dysphoric mood. Negative for self-injury and suicidal ideas. The patient is nervous/anxious.       Objective:    Physical Exam Constitutional:      Appearance: Normal appearance. He is obese.  Cardiovascular:     Rate and Rhythm: Normal rate and regular rhythm.     Pulses: Normal  pulses.     Heart sounds: Normal heart sounds.  Pulmonary:     Effort: Pulmonary effort is normal.     Breath sounds: Normal breath sounds.  Neurological:     Mental Status: He is alert.  Psychiatric:     Comments: Tearful, anxious, states his daughter died, and he has to put down the dog that belonged to her and he is taking it exceptionally hard    BP 126/76 (BP Location: Left Arm, Patient Position: Sitting, Cuff Size: Large)   Pulse 84   Ht _0  (1.727 m)   Wt 243 lb (110.2 kg)   SpO2 94%   BMI 36.95 kg/m  Wt Readings from Last 3 Encounters:  10/17/20 243 lb (110.2 kg)  08/01/20 248 lb (112.5 kg)  04/17/20 249 lb (112.9 kg)    Health Maintenance Due  Topic Date Due   PNEUMOCOCCAL POLYSACCHARIDE VACCINE AGE 62-64 HIGH RISK  Never done   OPHTHALMOLOGY EXAM  Never done   COLONOSCOPY (Pts 45-68yr Insurance coverage will need to be confirmed)  Never done   Zoster Vaccines- Shingrix (1 of 2) Never done   COVID-19 Vaccine (3 - Booster for Moderna series) 03/19/2020   FOOT EXAM  05/23/2020   INFLUENZA VACCINE  09/23/2020    There are no preventive care reminders to display for this patient.   Lab  Results  Component Value Date   TSH 2.190 08/02/2020   Lab Results  Component Value Date   WBC 7.5 08/02/2020   HGB 15.4 08/02/2020   HCT 46.5 08/02/2020   MCV 86 08/02/2020   PLT 206 08/02/2020   Lab Results  Component Value Date   NA 138 08/02/2020   K 4.5 08/02/2020   CO2 21 08/02/2020   GLUCOSE 234 (H) 08/02/2020   BUN 17 08/02/2020   CREATININE 0.84 08/02/2020   BILITOT 0.6 08/02/2020   ALKPHOS 66 08/02/2020   AST 46 (H) 08/02/2020   ALT 72 (H) 08/02/2020   PROT 7.3 08/02/2020   ALBUMIN 5.1 (H) 08/02/2020   CALCIUM 9.9 08/02/2020   ANIONGAP 7 03/22/2020   EGFR 99 08/02/2020   Lab Results  Component Value Date   CHOL 153 08/02/2020   Lab Results  Component Value Date   HDL 41 08/02/2020   Lab Results  Component Value Date   LDLCALC 84 08/02/2020    Lab Results  Component Value Date   TRIG 159 (H) 08/02/2020   Lab Results  Component Value Date   CHOLHDL 3.7 02/05/2017   Lab Results  Component Value Date   HGBA1C 8.5 (H) 08/02/2020       Assessment & Plan:   Problem List Items Addressed This Visit       Cardiovascular and Mediastinum   Essential hypertension    BP Readings from Last 3 Encounters:  10/17/20 126/76  09/24/20 124/78  08/01/20 115/71  -well controlled        Endocrine   Diabetes mellitus type 2, insulin dependent ([place])    -Rx. dexcom -Rx. trulicity -pharmacy referral      Relevant Medications   Continuous Blood Gluc Receiver (DEXCOM G6 RECEIVER) DEVI   Continuous Blood Gluc Sensor (DEXCOM G6 SENSOR) MISC   Continuous Blood Gluc Transmit (DEXCOM G6 TRANSMITTER) MISC   Dulaglutide (TRULICITY) 5.42 HC/6.2BJ SOPN   Other Relevant Orders   AMB Referral to Community Care Coordinaton   Hypothyroidism    Lab Results  Component Value Date   TSH 2.190 08/02/2020  -check TSH+T4 with next set of labs        Other   Anxiety - Primary   Relevant Orders   Ambulatory referral to Psychiatry     Meds ordered this encounter  Medications   Continuous Blood Gluc Receiver ([place]) DEVI    Sig: 1 each by Does not apply route once for 1 dose.    Dispense:  1 each    Refill:  0   Continuous Blood Gluc Sensor (DEXCOM G6 SENSOR) MISC    Sig: Change sensor every 10 days    Dispense:  3 each    Refill:  11   Continuous Blood Gluc Transmit (DEXCOM G6 TRANSMITTER) MISC    Sig: 1 each by Does not apply route once for 1 dose.    Dispense:  1 each    Refill:  0   Dulaglutide (TRULICITY) 6.28 BT/5.1VO SOPN    Sig: Inject 0.75 mg into the skin once a week.    Dispense:  2 mL    Refill:  1   Time spent: 42 minutes  Noreene Larsson, NP

## 2020-10-17 NOTE — Telephone Encounter (Signed)
Patient called upset stating someone from here called him and did not give him enough time to get to the phone and we hung up on him, and didn't even leave a message on his answering machine.  When he calls [place] he leaves a message for [place].    I advised him we have an appt for him today and we called him yesterday and confirmed his appt for this afternoon.   He wants a note put in his chart to advise [place] that we need to give him time to answer the phone or leave a voicemail.

## 2020-10-18 ENCOUNTER — Telehealth: Payer: Self-pay | Admitting: *Deleted

## 2020-10-18 NOTE — Chronic Care Management (AMB) (Signed)
  Chronic Care Management   Note  10/18/2020 Name: Thomas Rollins MRN: [id number] DOB: Oct 07, 1960  [name] is a 60 y.o. year old male who is a primary care patient of Noreene Larsson, NP. I reached out to Randall An by phone today in response to a referral sent by [name]'s PCP, Noreene Larsson, NP      [name] was given information about Chronic Care Management services today including:  CCM service includes personalized support from designated clinical staff supervised by his physician, including individualized plan of care and coordination with other care providers 24/7 contact phone numbers for assistance for urgent and routine care needs. Service will only be billed when office clinical staff spend 20 minutes or more in a month to coordinate care. Only one practitioner may furnish and bill the service in a calendar month. The patient may stop CCM services at any time (effective at the end of the month) by phone call to the office staff. The patient will be responsible for cost sharing (co-pay) of up to 20% of the service fee (after annual deductible is met).  Patient agreed to services and verbal consent obtained.   Follow up plan: Telephone appointment with care management team member scheduled for:10/22/20  [facility]  Direct Dial: [phone]

## 2020-10-22 ENCOUNTER — Ambulatory Visit (INDEPENDENT_AMBULATORY_CARE_PROVIDER_SITE_OTHER): Payer: Medicare HMO | Admitting: Pharmacist

## 2020-10-22 ENCOUNTER — Other Ambulatory Visit: Payer: Self-pay

## 2020-10-22 DIAGNOSIS — Z794 Long term (current) use of insulin: Secondary | ICD-10-CM

## 2020-10-22 DIAGNOSIS — I10 Essential (primary) hypertension: Secondary | ICD-10-CM | POA: Diagnosis not present

## 2020-10-22 DIAGNOSIS — E119 Type 2 diabetes mellitus without complications: Secondary | ICD-10-CM

## 2020-10-22 DIAGNOSIS — E1169 Type 2 diabetes mellitus with other specified complication: Secondary | ICD-10-CM | POA: Diagnosis not present

## 2020-10-22 DIAGNOSIS — E785 Hyperlipidemia, unspecified: Secondary | ICD-10-CM | POA: Diagnosis not present

## 2020-10-22 NOTE — Patient Instructions (Signed)
Thomas Rollins,  It was great to talk to you today!  Please call me with any questions or concerns.   Visit Information   PATIENT GOALS:   Goals Addressed             This Visit's Progress    Medication Management       Patient Goals/Self-Care Activities Over the next 90  days, patient will:  Take medications as prescribed Check blood sugar twice a day at the following times: fasting (at least 8 hours since last food consumption), bedtime, and whenever patient experiences symptoms of hypo/hyperglycemia, document, and provide at future appointments Engage in dietary modifications by fewer sweetened foods & beverages and watch portion sizes/amount of food eaten at one time        Consent to CCM Services: Thomas Rollins was given information about Chronic Care Management services including:  CCM service includes personalized support from designated clinical staff supervised by his physician, including individualized plan of care and coordination with other care providers 24/7 contact phone numbers for assistance for urgent and routine care needs. Service will only be billed when office clinical staff spend 20 minutes or more in a month to coordinate care. Only one practitioner may furnish and bill the service in a calendar month. The patient may stop CCM services at any time (effective at the end of the month) by phone call to the office staff. The patient will be responsible for cost sharing (co-pay) of up to 20% of the service fee (after annual deductible is met).  Patient agreed to services and verbal consent obtained.   Patient verbalizes understanding of instructions provided today and agrees to view in [place].   Face to Face appointment with care management team member scheduled for: 11/12/20  Kennon Holter, PharmD Clinical Pharmacist [facility] [phone]  CLINICAL CARE PLAN: Patient Care Plan: Medication Management     Problem  Identified: T2DM, HTN, HLD   Priority: High  Onset Date: 10/22/2020     Long-Range Goal: Disease Progression Prevention   Start Date: 10/22/2020  Expected End Date: 01/20/2021  This Visit's Progress: On track  Priority: High  Note:   Current Barriers:  Unable to achieve control of diabetes  Pharmacist Clinical Goal(s):  Over the next 90  days, patient will Achieve control of diabetes as evidenced by improved A1c through collaboration with PharmD and provider.   Interventions: 1:1 collaboration with Noreene Larsson, NP regarding development and update of comprehensive plan of care as evidenced by provider attestation and co-signature Inter-disciplinary care team collaboration (see longitudinal plan of care) Comprehensive medication review performed; medication list updated in electronic medical record  Type 2 Diabetes: Current medications: metformin 1,000 mg by mouth  twice daily with meals, dulaglutide (Trulicity) 0.38 mg subcutaneously weekly on Tuesday, and insulin detemir (Levemir) 38 units subcutaneously twice daily Patient plans to taking first Trulicity injection on [birth year]/33[birth year] Intolerances: none Taking medications as directed: no, using 38 units of Levemir twice daily and is not using Novolog with every meal (only using at bedtime if blood glucose >300) Side effects thought to be attributed to current medication regimen: no Denies hypoglycemic/hyperglycemic symptoms Hypoglycemia prevention: not indicated at this time Current meal patterns: breakfast: eggs, sausage, and half slice of toast w/o butter ; lunch:  chilli dog ; dinner: Fried chicken, rice, pasta, vegetables, and friend shrimp ; snacks: cookies, fruit, sandwich, and cheese puffs ; drinks: water, regular soda, juice, and milk Current exercise:  yard work On a statin: [  x] Yes  $Re'[]'eaF$  No    Last microalbumin: 16.1 (04/29/20); on an ACEi/ARB: $RemoveBefo'[x]'[id number]$  Yes  $Re'[]'HTP$  No    Last eye exam: unknown Last foot exam: unknown Pneumonia vaccine:  unknown Current glucose readings: has not been checking consistently due to his fingers hurting. Sometimes fasting blood glucose in the 200s Uncontrolled; Most recent A1c above goal of <7% per ADA guidelines Introduction to diabetes basic facts Medication: Identify diabetes medication and when best taken Monitoring: target blood sugar range, when to test Signs and symptoms of hyperglycemia Hypoglycemia: I have discussed with the patient how to treat hypoglycemia by the rule of 15; eat/drink 15g of sugar in the form of glucose tabs, 4 ounces of juice or soda and recheck fingerstick glucose in 15 minutes. Chocolate bars and ice cream should be avoided because fat delays carbohydrate digestion and absorption. Retreat if glucose remains low. Driving should cease until glucose is normal. Continue metformin 1,000 mg by mouth twice daily with meals, dulaglutide (Trulicity) 7.06 mg subcutaneously weekly on Tuesday, and insulin detemir (Levemir) 38 units subcutaneously twice daily Discontinue insulin aspart (Novolog) Consider increasing Trulicity to 1.5 mg subcutaneously weekly after 1 month if able to tolerate Instructed to monitor blood sugars twice a day at the following times: fasting (at least 8 hours since last food consumption), bedtime, and whenever patient experiences symptoms of hypo/hyperglycemia Encouraged regular aerobic exercise with a goal of 30 minutes five times per week (150 minutes per week) Discussed management of hypoglycemia. If blood sugar <70 at any time, treat with simple sugar such as 1/2 cup juice or regular soda or 3-4 glucose tablets. Recheck blood sugar in 15 minutes and repeat if blood sugar remains <70.  Patient identified as a good candidate for SGLT-2 inhibitor given reduction in cardiovascular disease, slowed chronic kidney disease progression, low risk of hypoglycemia, and weight loss. Patient denies a history of significant genitourinary infections. No concern for  hypotension/volume depletion. GFR at least >20 mL/minute/1.73 m2. Can consider discussing further at future visit.  Will work on getting patient a Dexcom G6 through a DME supplier  Hypertension: Current medications: lisinopril 10 mg by mouth once daily Intolerances: none Taking medications as directed: yes Side effects thought to be attributed to current medication regimen: no Denies dizziness, lightheadedness, blurred vision, and headache Home blood pressure readings: not discussed today Blood pressure under good control. Blood pressure is at goal of <130/80 mmHg per 2017 AHA/ACC guidelines. Continue lisinopril 10 mg by mouth once daily Encourage dietary sodium restriction/DASH diet Recommend regular aerobic exercise  Hyperlipidemia: Current medications: simvastatin 40 mg by mouth at bedtime and fish oil 1 capsule by mouth every morning  Intolerances: none Taking medications as directed: yes Side effects thought to be attributed to current medication regimen: no Controlled; LDL at goal of <100 due to high risk given at least 2 major CV risk factors (diabetes and hypertension) and 10-year risk 10-20% per 2020 AACE/ACE guidelines and TG above goal of <150 per 2020 AACE/ACE guidelines Continue simvastatin 40 mg by mouth at bedtime Discussed need for and importance of continued work on weight loss Consider increasing fish oil to 2 capsules by mouth daily to slightly lower triglycerides  Patient Goals/Self-Care Activities Over the next 90  days, patient will:  Take medications as prescribed Check blood sugar twice a day at the following times: fasting (at least 8 hours since last food consumption), bedtime, and whenever patient experiences symptoms of hypo/hyperglycemia, document, and provide at future appointments Engage in dietary modifications  by fewer sweetened foods & beverages and watch portion sizes/amount of food eaten at one time  Follow Up Plan: Face to Face appointment with care  management team member scheduled for: 11/12/20

## 2020-10-22 NOTE — Chronic Care Management (AMB) (Signed)
Chronic Care Management Pharmacy Note  10/22/2020 Name:  Thomas Rollins MRN:  [id number] DOB:  [birth year]  Summary:  Patient reports taking 38 units of Levemir twice daily and is not using Novolog with every meal (only using at bedtime if blood glucose >300) Will work on getting patient a Dexcom G6 through a DME supplier. Paperwork completed through [employer] on 10/22/20 at 2:45 PM.   Recommendations made from today's visit:  Discontinue insulin aspart (Novolog) since patient will now be starting Trulicity which will decrease prandial blood glucose. Using both bolus insulin and a GLP-1 agonist may increase the likelihood of hypoglycemia Consider increasing Trulicity to 1.5 mg subcutaneously weekly after 1 month if able to tolerate  Subjective: Thomas Rollins is an 59 y.o. year old male who is a primary patient of Heather Roberts, NP.  The CCM team was consulted for assistance with disease management and care coordination needs.    Engaged with patient face to face for initial visit in response to provider referral for pharmacy case management and/or care coordination services.   Consent to Services:  The patient was given the following information about Chronic Care Management services today, agreed to services, and gave verbal consent: 1. CCM service includes personalized support from designated clinical staff supervised by the primary care provider, including individualized plan of care and coordination with other care providers 2. 24/7 contact phone numbers for assistance for urgent and routine care needs. 3. Service will only be billed when office clinical staff spend 20 minutes or more in a month to coordinate care. 4. Only one practitioner may furnish and bill the service in a calendar month. 5.The patient may stop CCM services at any time (effective at the end of the month) by phone call to the office staff. 6. The patient will be responsible for cost sharing  (co-pay) of up to 20% of the service fee (after annual deductible is met). Patient agreed to services and consent obtained.  Patient Care Team: Heather Roberts, NP as PCP - General (Nurse Practitioner) Aliene Beams, MD as PCP - Family Medicine (Family Medicine) Lanelle Bal, DO as Consulting Physician (Gastroenterology) Gavin Pound, [facility] (Pharmacist)  Objective:  Lab Results  Component Value Date   CREATININE 0.84 08/02/2020   CREATININE 0.99 04/29/2020   CREATININE 0.75 (L) 03/27/2020    Lab Results  Component Value Date   HGBA1C 8.5 (H) 08/02/2020   Last diabetic Eye exam: No results found for: HMDIABEYEEXA  Last diabetic Foot exam: No results found for: HMDIABFOOTEX      Component Value Date/Time   CHOL 153 08/02/2020 1056   TRIG 159 (H) 08/02/2020 1056   HDL 41 08/02/2020 1056   CHOLHDL 3.7 02/05/2017 1101   LDLCALC 84 08/02/2020 1056   LDLCALC 87 02/05/2017 1101    Hepatic Function Latest Ref Rng & Units 08/02/2020 04/29/2020 03/27/2020  Total Protein 6.0 - 8.5 g/dL 7.3 7.0 7.1  Albumin 3.8 - 4.8 g/dL 5.1(H) 4.5 4.8  AST 0 - 40 IU/L 46(H) 24 30  ALT 0 - 44 IU/L 72(H) 38 47(H)  Alk Phosphatase 44 - 121 IU/L 66 73 72  Total Bilirubin 0.0 - 1.2 mg/dL 0.6 0.4 0.3  Bilirubin, Direct 0.0 - 0.2 mg/dL - - -    Lab Results  Component Value Date/Time   TSH 2.190 08/02/2020 10:56 AM   TSH 1.430 03/07/2020 11:22 AM   FREET4 1.35 08/02/2020 10:56 AM   FREET4 <0.10 (L)  01/31/2020 11:28 AM    CBC Latest Ref Rng & Units 08/02/2020 04/29/2020 03/27/2020  WBC 3.4 - 10.8 x10E3/uL 7.5 6.1 8.1  Hemoglobin 13.0 - 17.7 g/dL 15.4 14.9 15.6  Hematocrit 37.5 - 51.0 % 46.5 45.4 46.1  Platelets 150 - 450 x10E3/uL 206 184 206    Lab Results  Component Value Date/Time   VD25OH 33.1 01/31/2020 11:28 AM    Clinical ASCVD: No  The 10-year ASCVD risk score Mikey Bussing DC Jr., et al., 2013) is: 19.3%   Values used to calculate the score:     Age: 51 years     Sex: Male     Is  Non-Hispanic African American: No     Diabetic: Yes     Tobacco smoker: No     Systolic Blood Pressure: 638 mmHg     Is BP treated: Yes     HDL Cholesterol: 41 mg/dL     Total Cholesterol: 153 mg/dL    Social History   Tobacco Use  Smoking Status Former   Types: Cigarettes  Smokeless Tobacco Never   BP Readings from Last 3 Encounters:  10/17/20 126/76  09/24/20 124/78  08/01/20 115/71   Pulse Readings from Last 3 Encounters:  10/17/20 84  09/24/20 (!) 102  08/01/20 88   Wt Readings from Last 3 Encounters:  10/17/20 243 lb (110.2 kg)  08/01/20 248 lb (112.5 kg)  04/17/20 249 lb (112.9 kg)    Assessment: Review of patient past medical history, allergies, medications, health status, including review of consultants reports, laboratory and other test data, was performed as part of comprehensive evaluation and provision of chronic care management services.   SDOH:  (Social Determinants of Health) assessments and interventions performed:    CCM Care Plan  Allergies  Allergen Reactions   Codeine Other (See Comments)    Slurred Speech    Other     [place], sweat potatoes,    Penicillins Rash    Medications Reviewed Today     Reviewed by Beryle Lathe, [facility] (Pharmacist) on 10/22/20 at 1336  Med List Status: <None>   Medication Order Taking? Sig Documenting Provider Last Dose Status Informant  albuterol (VENTOLIN HFA) 108 (90 Base) MCG/ACT inhaler [id number] No Inhale 2 puffs into the lungs every 6 (six) hours as needed for wheezing or shortness of breath.  Patient not taking: Reported on 10/22/2020   Noreene Larsson, NP Not Taking Active   ALPHAGAN P 0.1 % SOLN [id number] Yes Apply 1 drop to eye 2 (two) times daily. [provider] Taking Active            Med Note Rico Junker, KENDRICK   Thu Sep 07, 2019  1:25 PM) Takes as needed  AMBULATORY NON FORMULARY MEDICATION [id number] No 1 vial by Intracavernosal route as needed. Medication Name:  Prostaglandin E1, 10  mcg vials.  Dispense 5 with 11 refills  Inject 1 vial as needed  Patient not taking: Reported on 10/22/2020   Franchot Gallo, MD Not Taking Active   blood glucose meter kit and supplies [id number]  Dispense based on patient and insurance preference. Use up to four times daily as directed. (FOR ICD-10 E10.9, E11.9). Noreene Larsson, NP  Active   Continuous Blood Gluc Sensor (DEXCOM G6 SENSOR) MISC [id number] No Change sensor every 10 days  Patient not taking: Reported on 10/22/2020   Noreene Larsson, NP Not Taking Active   Diclofenac Sodium 3 % GEL [id number] Yes Apply 1 g topically 3 (three)  times daily. [provider] Taking Active   Dulaglutide (TRULICITY) 3.22 GU/5.4YH SOPN [id number] No Inject 0.75 mg into the skin once a week.  Patient not taking: Reported on 10/22/2020   Noreene Larsson, NP Not Taking Active            Med Note Currie Paris Oct 22, 2020  1:31 PM) Patient picked up from pharmacy on 10/22/20  DULoxetine (CYMBALTA) 60 MG capsule [id number] Yes TAKE 1 CAPSULE BY MOUTH EVERY DAY Noreene Larsson, NP Taking Active   gabapentin (NEURONTIN) 300 MG capsule [id number] Yes TAKE ONE CAPSULE BY MOUTH TWICE DAILY  Patient taking differently: daily.   Noreene Larsson, NP Taking Active   GLOBAL EASE INJECT PEN NEEDLES 31G X 8 MM MISC [id number]  1 each by Other route as directed. [provider]  Active Self  insulin detemir (LEVEMIR) 100 UNIT/ML FlexPen [id number] Yes Inject 35 Units into the skin 2 (two) times daily.  Patient taking differently: Inject 38 Units into the skin 2 (two) times daily.   Noreene Larsson, NP Taking Active   levothyroxine (SYNTHROID) 125 MCG tablet [id number] Yes TAKE 1 TABLET BY MOUTH EVERY MORNING ON AN EMPTY STOMACH Noreene Larsson, NP Taking Active   lisinopril (ZESTRIL) 10 MG tablet [id number] Yes TAKE 1 TABLET BY MOUTH DAILY Noreene Larsson, NP Taking Active   meloxicam (MOBIC) 7.5 MG tablet [id number] Yes TAKE 1 TABLET BY MOUTH DAILY  Noreene Larsson, NP Taking Active   metFORMIN (GLUCOPHAGE) 1000 MG tablet [id number] Yes TAKE 1 TABLET BY MOUTH TWICE DAILY FOR 30 DAYS Noreene Larsson, NP Taking Active   morphine (MS CONTIN) 30 MG 12 hr tablet [id number] Yes Take 1 tablet by mouth 2 (two) times daily. Take with the $Remove'60mg'taUbzTO$   [provider] Taking Active Self  morphine (MS CONTIN) 60 MG 12 hr tablet [id number] Yes Take 1 tablet by mouth 2 (two) times daily. Takes with the $Remove'30mg'hhiKbLD$   [provider] Taking Active Self  NOVOLOG FLEXPEN 100 UNIT/ML FlexPen [id number] Yes INJECT 0-20 UNITS FOUR TIMES DAILY BEFORE MEALS & AT BEDTIME. FOR BLOOD SUGAR AT MEALS 70-139= NO INSULIN. 140-180=7 UNITS. 181-240=9 UNITS. 241-300=12 UNITS. 301-350=14 UNITS. 351-400=17 UNITS. >400=20 UNITS AND CALL DR. AT BEDTIME: >200=5 UNITS Lindell Spar, MD Taking Active            Med Note Waldo Laine, Gwenyth Allegra   Tue Oct 22, 2020  1:34 PM) Patient has been using sporadically as needed   Omega-3 Fatty Acids (FISH OIL) 1000 MG CAPS [id number] Yes Take 2 capsules (2,000 mg total) by mouth in the morning and at bedtime.  Patient taking differently: Take 1 capsule by mouth every morning.   Noreene Larsson, NP Taking Active   [facility] ULTRA test strip [id number]  USE UP TO FOUR TIMES DAILY AS DIRECTED (25 DAY supply) Noreene Larsson, NP  Active   simvastatin (ZOCOR) 40 MG tablet [id number] Yes TAKE 1 TABLET BY MOUTH EVERY EVENING Noreene Larsson, NP Taking Active   tiZANidine (ZANAFLEX) 4 MG tablet [id number] No Take 4 mg by mouth 3 (three) times daily as needed.  Patient not taking: Reported on 10/22/2020   [provider] Not Taking Active   traZODone (DESYREL) 50 MG tablet [id number] Yes Take 0.5-1 tablets (25-50 mg total) by mouth at bedtime as needed for sleep. Noreene Larsson, NP Taking Active  Patient Active Problem List   Diagnosis Date Noted   Erectile dysfunction 08/01/2020   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 03/27/2020   Sleep  apnea 01/31/2020   CPAP (continuous positive airway pressure) dependence 10/23/2019   Opioid contract exists 10/23/2019   Edentulous 10/23/2019   Insomnia, unspecified 10/23/2019   Anxiety 04/12/2019   Panic attacks 04/12/2019   Vitamin D deficiency 04/11/2019   Hypothyroidism 04/11/2019   Diabetes mellitus type 2, insulin dependent ([place]) 02/05/2017   Depression, recurrent ([place]) 02/05/2017   Hyperlipidemia associated with type 2 diabetes mellitus ([place]) 02/05/2017   Essential hypertension 02/05/2017    Immunization History  Administered Date(s) Administered   Influenza,inj,quad, With Preservative 12/16/2018   Moderna Sars-Covid-2 Vaccination 09/20/2019, 10/18/2019    Conditions to be addressed/monitored: HTN, HLD, and DMII  Care Plan : Medication Management  Updates made by Beryle Lathe, [place] since 10/22/2020 12:00 AM     Problem: T2DM, HTN, HLD   Priority: High  Onset Date: 10/22/2020     Long-Range Goal: Disease Progression Prevention   Start Date: 10/22/2020  Expected End Date: 01/20/2021  This Visit's Progress: On track  Priority: High  Note:   Current Barriers:  Unable to achieve control of diabetes  Pharmacist Clinical Goal(s):  Over the next 90  days, patient will Achieve control of diabetes as evidenced by improved A1c through collaboration with PharmD and provider.   Interventions: 1:1 collaboration with Noreene Larsson, NP regarding development and update of comprehensive plan of care as evidenced by provider attestation and co-signature Inter-disciplinary care team collaboration (see longitudinal plan of care) Comprehensive medication review performed; medication list updated in electronic medical record  Type 2 Diabetes: Current medications: metformin 1,000 mg by mouth  twice daily with meals, dulaglutide (Trulicity) 4.40 mg subcutaneously weekly on Tuesday, and insulin detemir (Levemir) 38 units subcutaneously twice daily Patient plans to taking  first Trulicity injection on 02/24/70 Intolerances: none Taking medications as directed: no, using 38 units of Levemir twice daily and is not using Novolog with every meal (only using at bedtime if blood glucose >300) Side effects thought to be attributed to current medication regimen: no Denies hypoglycemic/hyperglycemic symptoms Hypoglycemia prevention: not indicated at this time Current meal patterns: breakfast: eggs, sausage, and half slice of toast w/o butter ; lunch:  chilli dog ; dinner: Fried chicken, rice, pasta, vegetables, and friend shrimp ; snacks: cookies, fruit, sandwich, and cheese puffs ; drinks: water, regular soda, juice, and milk Current exercise:  yard work On a statin: $RemoveB'[x]'IVcSKSZp$  Yes  $Re'[]'QCO$  No    Last microalbumin: 16.1 (04/29/20); on an ACEi/ARB: $RemoveBefo'[x]'gBDqpFPgGRc$  Yes  $Re'[]'fdg$  No    Last eye exam: unknown Last foot exam: unknown Pneumonia vaccine: unknown Current glucose readings: has not been checking consistently due to his fingers hurting. Sometimes fasting blood glucose in the 200s Uncontrolled; Most recent A1c above goal of <7% per ADA guidelines Introduction to diabetes basic facts Medication: Identify diabetes medication and when best taken Monitoring: target blood sugar range, when to test Signs and symptoms of hyperglycemia Hypoglycemia: I have discussed with the patient how to treat hypoglycemia by the rule of 15; eat/drink 15g of sugar in the form of glucose tabs, 4 ounces of juice or soda and recheck fingerstick glucose in 15 minutes. Chocolate bars and ice cream should be avoided because fat delays carbohydrate digestion and absorption. Retreat if glucose remains low. Driving should cease until glucose is normal. Continue metformin 1,000 mg by mouth twice daily with meals, dulaglutide (Trulicity) 5.36  mg subcutaneously weekly on Tuesday, and insulin detemir (Levemir) 38 units subcutaneously twice daily Discontinue insulin aspart (Novolog) Consider increasing Trulicity to 1.5 mg  subcutaneously weekly after 1 month if able to tolerate Instructed to monitor blood sugars twice a day at the following times: fasting (at least 8 hours since last food consumption), bedtime, and whenever patient experiences symptoms of hypo/hyperglycemia Encouraged regular aerobic exercise with a goal of 30 minutes five times per week (150 minutes per week) Discussed management of hypoglycemia. If blood sugar <70 at any time, treat with simple sugar such as 1/2 cup juice or regular soda or 3-4 glucose tablets. Recheck blood sugar in 15 minutes and repeat if blood sugar remains <70.  Patient identified as a good candidate for SGLT-2 inhibitor given reduction in cardiovascular disease, slowed chronic kidney disease progression, low risk of hypoglycemia, and weight loss. Patient denies a history of significant genitourinary infections. No concern for hypotension/volume depletion. GFR at least >20 mL/minute/1.73 m2. Can consider discussing further at future visit.  Will work on getting patient a Dexcom G6 through a DME supplier  Hypertension: Current medications: lisinopril 10 mg by mouth once daily Intolerances: none Taking medications as directed: yes Side effects thought to be attributed to current medication regimen: no Denies dizziness, lightheadedness, blurred vision, and headache Home blood pressure readings: not discussed today Blood pressure under good control. Blood pressure is at goal of <130/80 mmHg per 2017 AHA/ACC guidelines. Continue lisinopril 10 mg by mouth once daily Encourage dietary sodium restriction/DASH diet Recommend regular aerobic exercise  Hyperlipidemia: Current medications: simvastatin 40 mg by mouth at bedtime and fish oil 1 capsule by mouth every morning  Intolerances: none Taking medications as directed: yes Side effects thought to be attributed to current medication regimen: no Controlled; LDL at goal of <100 due to high risk given at least 2 major CV risk  factors (diabetes and hypertension) and 10-year risk 10-20% per 2020 AACE/ACE guidelines and TG above goal of <150 per 2020 AACE/ACE guidelines Continue simvastatin 40 mg by mouth at bedtime Discussed need for and importance of continued work on weight loss Consider increasing fish oil to 2 capsules by mouth daily to slightly lower triglycerides  Patient Goals/Self-Care Activities Over the next 90  days, patient will:  Take medications as prescribed Check blood sugar twice a day at the following times: fasting (at least 8 hours since last food consumption), bedtime, and whenever patient experiences symptoms of hypo/hyperglycemia, document, and provide at future appointments Engage in dietary modifications by fewer sweetened foods & beverages and watch portion sizes/amount of food eaten at one time  Follow Up Plan: Face to Face appointment with care management team member scheduled for: 11/12/20      Medication Assistance: None required.  Patient affirms current coverage meets needs.  Patient's preferred pharmacy is:  [place], [place] [address] [place] [address] Phone: [phone] Fax: [phone]  [facility] [address], [place] [address] Phone: [phone] Fax: [phone]  Follow Up:  Patient agrees to Care Plan and Follow-up.  Plan: Face to Face appointment with care management team member scheduled for: 11/12/20  Kennon Holter, PharmD Clinical Pharmacist [facility] [phone]

## 2020-10-25 ENCOUNTER — Encounter: Payer: Self-pay | Admitting: Nurse Practitioner

## 2020-10-25 ENCOUNTER — Ambulatory Visit (INDEPENDENT_AMBULATORY_CARE_PROVIDER_SITE_OTHER): Payer: Medicare HMO | Admitting: Nurse Practitioner

## 2020-10-25 ENCOUNTER — Other Ambulatory Visit: Payer: Self-pay

## 2020-10-25 VITALS — BP 126/80 | HR 80 | Temp 97.9°F | Ht 68.0 in | Wt 238.0 lb

## 2020-10-25 DIAGNOSIS — E119 Type 2 diabetes mellitus without complications: Secondary | ICD-10-CM

## 2020-10-25 DIAGNOSIS — I10 Essential (primary) hypertension: Secondary | ICD-10-CM

## 2020-10-25 DIAGNOSIS — F419 Anxiety disorder, unspecified: Secondary | ICD-10-CM

## 2020-10-25 DIAGNOSIS — Z794 Long term (current) use of insulin: Secondary | ICD-10-CM | POA: Diagnosis not present

## 2020-10-25 DIAGNOSIS — E039 Hypothyroidism, unspecified: Secondary | ICD-10-CM | POA: Diagnosis not present

## 2020-10-25 MED ORDER — ALPRAZOLAM 0.5 MG PO TABS
0.5000 mg | ORAL_TABLET | Freq: Two times a day (BID) | ORAL | 0 refills | Status: DC | PRN
Start: 2020-10-25 — End: 2021-05-13

## 2020-10-25 NOTE — Progress Notes (Signed)
Acute Office Visit  Subjective:    Patient ID: Thomas Rollins, male    DOB: [birth year], 60 y.o.   MRN: [id number]  Chief Complaint  Patient presents with   Anxiety    Follow up    Anxiety Symptoms include nervous/anxious behavior. Patient reports no suicidal ideas.    Patient is in today for anxiety.  He states he has to put his dead daughter's dog down today. No SI/HI or psychosis.  He has hx of panic attacks.  He has been having dreams that he was talking with his dead daughter.  Past Medical History:  Diagnosis Date   Anxiety    panic attacks   Chronic pain    Depression    Diabetes mellitus without complication ([place])    Encounter for hepatitis C screening test for low risk patient 04/05/2019   Hyperlipidemia    Hypertension     Past Surgical History:  Procedure Laterality Date   ankle sx     ARTHROSCOPIC REPAIR ACL     CARPAL TUNNEL RELEASE     HERNIA REPAIR     SHOULDER ARTHROSCOPY      Family History  Problem Relation Age of Onset   Stomach cancer Mother    Heart Problems Father    Dementia Father    Cancer Father    Bone cancer Brother    Lung cancer Brother    Cancer Daughter     Social History   Socioeconomic History   Marital status: Divorced    Spouse name: Not on file   Number of children: 1   Years of education: Not on file   Highest education level: 12th grade  Occupational History   Not on file  Tobacco Use   Smoking status: Former    Types: Cigarettes   Smokeless tobacco: Never  Vaping Use   Vaping Use: Never used  Substance and Sexual Activity   Alcohol use: Yes    Comment: occ   Drug use: Yes    Types: Marijuana    Comment: 1-2 times a year    Sexual activity: Never  Other Topics Concern   Not on file  Social History Narrative   Lives with good friend of his, known for 69 years    Only daughter passed away from cancer in 2016-11-13       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   Takes time release morphine b/c  fell 15-20 feet from a building while working.    Now disabled        Exercises/walks: 7 days a week   Has pets at home: 5 dogs in the home.      Diet: Avoids white foods, fruits, veggies, does eat some fast food/eat out    Caffeine: none only hot chocolate or soda when stomach is upset   Water: 8 cups daily if not more      Wears seat belt   Smoke detectors and carbon monoxide detectors at home    Does not use phone while driving                      Social Determinants of Radio broadcast assistant Strain: Low Risk    Difficulty of Paying Living Expenses: Not very hard  Food Insecurity: No Food Insecurity   Worried About Charity fundraiser in the Last Year: Never true   Ran Out of Food in the Last Year: Never true  Transportation Needs:  No Transportation Needs   Lack of Transportation (Medical): No   Lack of Transportation (Non-Medical): No  Physical Activity: Sufficiently Active   Days of Exercise per Week: 4 days   Minutes of Exercise per Session: 40 min  Stress: Stress Concern Present   Feeling of Stress : To some extent  Social Connections: Socially Isolated   Frequency of Communication with Friends and Family: Twice a week   Frequency of Social Gatherings with Friends and Family: Never   Attends Religious Services: Never   Printmaker: No   Attends Music therapist: Never   Marital Status: Divorced  Human resources officer Violence: Not At Risk   Fear of Current or Ex-Partner: No   Emotionally Abused: No   Physically Abused: No   Sexually Abused: No    Outpatient Medications Prior to Visit  Medication Sig Dispense Refill   albuterol (VENTOLIN HFA) 108 (90 Base) MCG/ACT inhaler Inhale 2 puffs into the lungs every 6 (six) hours as needed for wheezing or shortness of breath. 8 g 0   ALPHAGAN P 0.1 % SOLN Apply 1 drop to eye 2 (two) times daily.     AMBULATORY NON FORMULARY MEDICATION 1 vial by Intracavernosal route as  needed. Medication Name:  Prostaglandin E1, 10 mcg vials.  Dispense 5 with 11 refills  Inject 1 vial as needed 5 vial 11   blood glucose meter kit and supplies Dispense based on patient and insurance preference. Use up to four times daily as directed. (FOR ICD-10 E10.9, E11.9). 1 each 0   Continuous Blood Gluc Sensor (DEXCOM G6 SENSOR) MISC Change sensor every 10 days 3 each 11   Diclofenac Sodium 3 % GEL Apply 1 g topically 3 (three) times daily.     Dulaglutide (TRULICITY) 2.37 SE/8.3TD SOPN Inject 0.75 mg into the skin once a week. 2 mL 1   DULoxetine (CYMBALTA) 60 MG capsule TAKE 1 CAPSULE BY MOUTH EVERY DAY 90 capsule 0   gabapentin (NEURONTIN) 300 MG capsule TAKE ONE CAPSULE BY MOUTH TWICE DAILY (Patient taking differently: daily.) 60 capsule 10   GLOBAL EASE INJECT PEN NEEDLES 31G X 8 MM MISC 1 each by Other route as directed.     insulin detemir (LEVEMIR) 100 UNIT/ML FlexPen Inject 35 Units into the skin 2 (two) times daily. (Patient taking differently: Inject 38 Units into the skin 2 (two) times daily.) 15 mL 11   levothyroxine (SYNTHROID) 125 MCG tablet TAKE 1 TABLET BY MOUTH EVERY MORNING ON AN EMPTY STOMACH 30 tablet 10   lisinopril (ZESTRIL) 10 MG tablet TAKE 1 TABLET BY MOUTH DAILY 30 tablet 10   meloxicam (MOBIC) 7.5 MG tablet TAKE 1 TABLET BY MOUTH DAILY 30 tablet 10   metFORMIN (GLUCOPHAGE) 1000 MG tablet TAKE 1 TABLET BY MOUTH TWICE DAILY FOR 30 DAYS 60 tablet 3   morphine (MS CONTIN) 30 MG 12 hr tablet Take 1 tablet by mouth 2 (two) times daily. Take with the $Remove'60mg'oloEgXI$       morphine (MS CONTIN) 60 MG 12 hr tablet Take 1 tablet by mouth 2 (two) times daily. Takes with the $Remove'30mg'RxXuuQY$       Omega-3 Fatty Acids (FISH OIL) 1000 MG CAPS Take 2 capsules (2,000 mg total) by mouth in the morning and at bedtime. (Patient taking differently: Take 1 capsule by mouth every morning.) 360 capsule 3   ONETOUCH ULTRA test strip USE UP TO FOUR TIMES DAILY AS DIRECTED (25 DAY supply) 100 strip 11  simvastatin (ZOCOR) 40 MG tablet TAKE 1 TABLET BY MOUTH EVERY EVENING 30 tablet 3   tiZANidine (ZANAFLEX) 4 MG tablet Take 4 mg by mouth 3 (three) times daily as needed.     traZODone (DESYREL) 50 MG tablet Take 0.5-1 tablets (25-50 mg total) by mouth at bedtime as needed for sleep. 30 tablet 3   No facility-administered medications prior to visit.    Allergies  Allergen Reactions   Codeine Other (See Comments)    Slurred Speech    Other     [place], sweat potatoes,    Penicillins Rash    Review of Systems  Constitutional: Negative.   Respiratory: Negative.    Cardiovascular: Negative.   Psychiatric/Behavioral:  Negative for self-injury, sleep disturbance and suicidal ideas. The patient is nervous/anxious.       Objective:    Physical Exam Constitutional:      Appearance: Normal appearance.  Cardiovascular:     Rate and Rhythm: Normal rate and regular rhythm.     Pulses: Normal pulses.     Heart sounds: Normal heart sounds.  Pulmonary:     Effort: Pulmonary effort is normal.     Breath sounds: Normal breath sounds.  Neurological:     Mental Status: He is alert.    BP 126/80 (BP Location: Left Arm, Patient Position: Sitting, Cuff Size: Large)   Pulse 80   Temp 97.9 F (36.6 C) (Oral)   Ht $R'5\' 8"'mJ$  (1.727 m)   Wt 238 lb (108 kg)   SpO2 97%   BMI 36.19 kg/m  Wt Readings from Last 3 Encounters:  10/25/20 238 lb (108 kg)  10/17/20 243 lb (110.2 kg)  08/01/20 248 lb (112.5 kg)    Health Maintenance Due  Topic Date Due   PNEUMOCOCCAL POLYSACCHARIDE VACCINE AGE 67-64 HIGH RISK  Never done   OPHTHALMOLOGY EXAM  Never done   COLONOSCOPY (Pts 45-16yrs Insurance coverage will need to be confirmed)  Never done   Zoster Vaccines- Shingrix (1 of 2) Never done   FOOT EXAM  05/23/2020    There are no preventive care reminders to display for this patient.   Lab Results  Component Value Date   TSH 2.190 08/02/2020   Lab Results  Component Value Date   WBC 7.5  08/02/2020   HGB 15.4 08/02/2020   HCT 46.5 08/02/2020   MCV 86 08/02/2020   PLT 206 08/02/2020   Lab Results  Component Value Date   NA 138 08/02/2020   K 4.5 08/02/2020   CO2 21 08/02/2020   GLUCOSE 234 (H) 08/02/2020   BUN 17 08/02/2020   CREATININE 0.84 08/02/2020   BILITOT 0.6 08/02/2020   ALKPHOS 66 08/02/2020   AST 46 (H) 08/02/2020   ALT 72 (H) 08/02/2020   PROT 7.3 08/02/2020   ALBUMIN 5.1 (H) 08/02/2020   CALCIUM 9.9 08/02/2020   ANIONGAP 7 03/22/2020   EGFR 99 08/02/2020   Lab Results  Component Value Date   CHOL 153 08/02/2020   Lab Results  Component Value Date   HDL 41 08/02/2020   Lab Results  Component Value Date   LDLCALC 84 08/02/2020   Lab Results  Component Value Date   TRIG 159 (H) 08/02/2020   Lab Results  Component Value Date   CHOLHDL 3.7 02/05/2017   Lab Results  Component Value Date   HGBA1C 8.5 (H) 08/02/2020       Assessment & Plan:   Problem List Items Addressed This Visit  Cardiovascular and Mediastinum   Essential hypertension   Relevant Orders   CBC with Differential/Platelet   CMP14+EGFR   Lipid Panel With LDL/HDL Ratio     Endocrine   Diabetes mellitus type 2, insulin dependent (HCC)   Relevant Orders   Hemoglobin A1c   Hypothyroidism   Relevant Orders   TSH + free T4     Other   Anxiety - Primary    -Rx. Xanax x1 week -has to put down his dog that belonged to his dead daughter, and he has been panicking while thinking about this -low dose because he has to drive to the vet for euthanasia don't want to risk DUI      Relevant Medications   ALPRAZolam (XANAX) 0.5 MG tablet     Meds ordered this encounter  Medications   ALPRAZolam (XANAX) 0.5 MG tablet    Sig: Take 1 tablet (0.5 mg total) by mouth 2 (two) times daily as needed for anxiety.    Dispense:  14 tablet    Refill:  0      Noreene Larsson, NP

## 2020-10-25 NOTE — Patient Instructions (Signed)
Please have fasting labs drawn 2-3 days prior to your appointment so we can discuss the results during your office visit.  

## 2020-10-25 NOTE — Assessment & Plan Note (Signed)
-  Rx. Xanax x1 week -has to put down his dog that belonged to his dead daughter, and he has been panicking while thinking about this -low dose because he has to drive to the vet for euthanasia don't want to risk DUI

## 2020-10-29 ENCOUNTER — Ambulatory Visit: Payer: Medicare HMO | Admitting: Urology

## 2020-10-31 ENCOUNTER — Ambulatory Visit (INDEPENDENT_AMBULATORY_CARE_PROVIDER_SITE_OTHER): Payer: Medicare HMO | Admitting: Licensed Clinical Social Worker

## 2020-10-31 ENCOUNTER — Other Ambulatory Visit: Payer: Self-pay

## 2020-10-31 DIAGNOSIS — F419 Anxiety disorder, unspecified: Secondary | ICD-10-CM

## 2020-11-01 ENCOUNTER — Ambulatory Visit: Payer: Medicare HMO | Admitting: Nurse Practitioner

## 2020-11-04 DIAGNOSIS — Z79899 Other long term (current) drug therapy: Secondary | ICD-10-CM | POA: Diagnosis not present

## 2020-11-04 DIAGNOSIS — M25551 Pain in right hip: Secondary | ICD-10-CM | POA: Diagnosis not present

## 2020-11-04 DIAGNOSIS — G894 Chronic pain syndrome: Secondary | ICD-10-CM | POA: Diagnosis not present

## 2020-11-04 DIAGNOSIS — E119 Type 2 diabetes mellitus without complications: Secondary | ICD-10-CM | POA: Diagnosis not present

## 2020-11-04 DIAGNOSIS — G5601 Carpal tunnel syndrome, right upper limb: Secondary | ICD-10-CM | POA: Diagnosis not present

## 2020-11-07 ENCOUNTER — Other Ambulatory Visit: Payer: Self-pay

## 2020-11-07 ENCOUNTER — Ambulatory Visit (INDEPENDENT_AMBULATORY_CARE_PROVIDER_SITE_OTHER): Payer: Medicare HMO | Admitting: Licensed Clinical Social Worker

## 2020-11-07 DIAGNOSIS — F419 Anxiety disorder, unspecified: Secondary | ICD-10-CM

## 2020-11-12 ENCOUNTER — Ambulatory Visit (INDEPENDENT_AMBULATORY_CARE_PROVIDER_SITE_OTHER): Payer: Medicare HMO | Admitting: Pharmacist

## 2020-11-12 ENCOUNTER — Other Ambulatory Visit: Payer: Self-pay

## 2020-11-12 DIAGNOSIS — I10 Essential (primary) hypertension: Secondary | ICD-10-CM

## 2020-11-12 DIAGNOSIS — E119 Type 2 diabetes mellitus without complications: Secondary | ICD-10-CM

## 2020-11-12 DIAGNOSIS — Z794 Long term (current) use of insulin: Secondary | ICD-10-CM

## 2020-11-12 DIAGNOSIS — E785 Hyperlipidemia, unspecified: Secondary | ICD-10-CM

## 2020-11-12 DIAGNOSIS — E1169 Type 2 diabetes mellitus with other specified complication: Secondary | ICD-10-CM

## 2020-11-12 NOTE — Patient Instructions (Signed)
Thomas Rollins,  It was great to talk to you today!  Please call me with any questions or concerns.   Dexcom G6 Training  1. For help or to see the videos: [email or web address]-videos  2. You should have a sensor, a transmitter, and a display device. - Each sensor is good for 10 days - Each transmitter is good for 3 months    3. You will still need your glucose meter - If you take acetaminophen 1000 mg every 6 hours or more - Any time your symptoms do not match the reading on your Dexcom - Any time you don't have a number and an arrow  If you have a MRI or CT scan - The company recommends you not wear the dexcom as it is unknown if the heat and magnetic fields could damage the components. - To get the most out of your session, we advise that you try to schedule your procedure near the end of your sensor session to avoid needing an extra sensor.  4. Change the site where you place the sensor with each new insertion.  If there is adhesive residue left on the skin, you can try applying a household oil like baby oil or coconut oil.  5. Avoid injecting insulin within 3 inches of the sensor.  6. Water. The transmitter is water resistant, but the receiver is not.  You can still swim, shower, and take a bath.  However, your receiver needs to be in a dry area and within 20 feet of the transmitter to get readings.    7. If you want to use your smartphone: - Download the Dexcom G6 app.  You will need to create a Dexcom account.   - The app and the receiver do not talk to each other.  Therefore, you'll have to set up alerts in the app as well.   8. If you want to share your data with your provider. - Once you have downloaded the Dexcom G6 app, download the Dexcom Clarity app. - If you use the Dexcom G6 app, you will not need to upload data to a computer since the data is automatically sent to [facility] Clarity.   - The clinic will then invite you to share your data with the  clinic's account in Dexcom Clarity.    Here is how the clinic will create an invitation to send to you:  Click Patients.  Click the patient's name you want to invite.  Click Share data.  Click Print an Invitation to view and print, or Email an Invitation, then enter the patient's email address.  Click Invite, then Close.  9. You can call Dexcom for help: [phone] - For an error message or issues with the sensors: select option 2. - For help with data sharing: select option 3 for Patient Care. They are available Monday through Friday 9 am-8pm andSaturday 10 am-4:30 pm.   10.  The sensor glucose may not always be the same as your blood glucose. Sensor glucose is a glucose estimate that lags behind the blood glucose.          Visit Information  PATIENT GOALS:  Goals Addressed             This Visit's Progress    Medication Management       Patient Goals/Self-Care Activities Over the next 90  days, patient will:  Take medications as prescribed Check blood sugar twice a day at the following times: fasting (at least 8 hours  since last food consumption), bedtime, and whenever patient experiences symptoms of hypo/hyperglycemia, document, and provide at future appointments Engage in dietary modifications by fewer sweetened foods & beverages and watch portion sizes/amount of food eaten at one time          Patient verbalizes understanding of instructions provided today and agrees to view in MyChart.   Face to Face appointment with care management team member scheduled for: 11/19/20  Domenic Moras, PharmD Clinical Pharmacist [facility] [phone]

## 2020-11-12 NOTE — Chronic Care Management (AMB) (Addendum)
Chronic Care Management Pharmacy Note  11/12/2020 Name:  Thomas Rollins MRN:  [id number] DOB:  Jul 24, 1960  Summary:  Patient brought Dexcom G6 transmitter and sensor with intention to place today and setup; however, his phone is not compatible. Unable to setup and place CGM today. Had patient call DME supplier during visit to request receiver device. DME supplier will plan to ship item today with plan for patient to receive later this week. Will have patient return next week for Dexcom placement.  Consider increasing Trulicity to 1.5 mg subcutaneously weekly after 1 month if able to tolerate. May also need to increase basal insulin requirement as well given consistently elevated blood glucose. Discussed with PCP and will plan to increase Levemir to 44 units subcutaneously twice daily given most blood glucose values in the 200-300s.  Subjective: Thomas Rollins is an 60 y.o. year old male who is a primary patient of Heather Roberts, NP.  The CCM team was consulted for assistance with disease management and care coordination needs.    Engaged with patient face to face for follow up visit in response to provider referral for pharmacy case management and/or care coordination services.   Consent to Services:  The patient was given information about Chronic Care Management services, agreed to services, and gave verbal consent prior to initiation of services.  Please see initial visit note for detailed documentation.   Patient Care Team: Heather Roberts, NP as PCP - General (Nurse Practitioner) Aliene Beams, MD as PCP - Family Medicine (Family Medicine) Lanelle Bal, DO as Consulting Physician (Gastroenterology) Gavin Pound, [facility] (Pharmacist)  Objective:  Lab Results  Component Value Date   CREATININE 0.84 08/02/2020   CREATININE 0.99 04/29/2020   CREATININE 0.75 (L) 03/27/2020    Lab Results  Component Value Date   HGBA1C 8.5 (H) 08/02/2020   Last diabetic  Eye exam: No results found for: HMDIABEYEEXA  Last diabetic Foot exam: No results found for: HMDIABFOOTEX      Component Value Date/Time   CHOL 153 08/02/2020 1056   TRIG 159 (H) 08/02/2020 1056   HDL 41 08/02/2020 1056   CHOLHDL 3.7 02/05/2017 1101   LDLCALC 84 08/02/2020 1056   LDLCALC 87 02/05/2017 1101    Hepatic Function Latest Ref Rng & Units 08/02/2020 04/29/2020 03/27/2020  Total Protein 6.0 - 8.5 g/dL 7.3 7.0 7.1  Albumin 3.8 - 4.8 g/dL 5.1(H) 4.5 4.8  AST 0 - 40 IU/L 46(H) 24 30  ALT 0 - 44 IU/L 72(H) 38 47(H)  Alk Phosphatase 44 - 121 IU/L 66 73 72  Total Bilirubin 0.0 - 1.2 mg/dL 0.6 0.4 0.3  Bilirubin, Direct 0.0 - 0.2 mg/dL - - -    Lab Results  Component Value Date/Time   TSH 2.190 08/02/2020 10:56 AM   TSH 1.430 03/07/2020 11:22 AM   FREET4 1.35 08/02/2020 10:56 AM   FREET4 <0.10 (L) 01/31/2020 11:28 AM    CBC Latest Ref Rng & Units 08/02/2020 04/29/2020 03/27/2020  WBC 3.4 - 10.8 x10E3/uL 7.5 6.1 8.1  Hemoglobin 13.0 - 17.7 g/dL 99.3 19.3 65.8  Hematocrit 37.5 - 51.0 % 46.5 45.4 46.1  Platelets 150 - 450 x10E3/uL 206 184 206    Lab Results  Component Value Date/Time   VD25OH 33.1 01/31/2020 11:28 AM    Clinical ASCVD: No  The 10-year ASCVD risk score (Arnett DK, et al., 2019) is: 19.3%   Values used to calculate the score:     Age:  60 years     Sex: Male     Is Non-Hispanic African American: No     Diabetic: Yes     Tobacco smoker: No     Systolic Blood Pressure: 185 mmHg     Is BP treated: Yes     HDL Cholesterol: 41 mg/dL     Total Cholesterol: 153 mg/dL    Social History   Tobacco Use  Smoking Status Former   Types: Cigarettes  Smokeless Tobacco Never   BP Readings from Last 3 Encounters:  10/25/20 126/80  10/17/20 126/76  09/24/20 124/78   Pulse Readings from Last 3 Encounters:  10/25/20 80  10/17/20 84  09/24/20 (!) 102   Wt Readings from Last 3 Encounters:  10/25/20 238 lb (108 kg)  10/17/20 243 lb (110.2 kg)  08/01/20 248 lb  (112.5 kg)    Assessment: Review of patient past medical history, allergies, medications, health status, including review of consultants reports, laboratory and other test data, was performed as part of comprehensive evaluation and provision of chronic care management services.   SDOH:  (Social Determinants of Health) assessments and interventions performed:    CCM Care Plan  Allergies  Allergen Reactions   Codeine Other (See Comments)    Slurred Speech    Other     [place], sweat potatoes,    Penicillins Rash    Medications Reviewed Today     Reviewed by Beryle Lathe, [facility] (Pharmacist) on 11/12/20 at 1310  Med List Status: <None>   Medication Order Taking? Sig Documenting Provider Last Dose Status Informant  albuterol (VENTOLIN HFA) 108 (90 Base) MCG/ACT inhaler [id number] Yes Inhale 2 puffs into the lungs every 6 (six) hours as needed for wheezing or shortness of breath. Noreene Larsson, NP Taking Active   ALPHAGAN P 0.1 % SOLN [id number] Yes Apply 1 drop to eye 2 (two) times daily. [provider] Taking Active            Med Note Rico Junker, KENDRICK   Thu Sep 07, 2019  1:25 PM) Takes as needed  ALPRAZolam Duanne Moron) 0.5 MG tablet [id number] Yes Take 1 tablet (0.5 mg total) by mouth 2 (two) times daily as needed for anxiety. Noreene Larsson, NP Taking Active   AMBULATORY NON FORMULARY MEDICATION [id number]  1 vial by Intracavernosal route as needed. Medication Name:  Prostaglandin E1, 10 mcg vials.  Dispense 5 with 11 refills  Inject 1 vial as needed Franchot Gallo, MD  Active   blood glucose meter kit and supplies [id number]  Dispense based on patient and insurance preference. Use up to four times daily as directed. (FOR ICD-10 E10.9, E11.9). Noreene Larsson, NP  Active   Continuous Blood Gluc Sensor (DEXCOM G6 SENSOR) MISC [id number]  Change sensor every 10 days Noreene Larsson, NP  Active   Diclofenac Sodium 3 % GEL [id number]  Apply 1 g topically 3 (three) times daily.  [provider]  Active   Dulaglutide (TRULICITY) 5.17 GY/1.7CB SOPN [id number] Yes Inject 0.75 mg into the skin once a week. Noreene Larsson, NP Taking Active            Med Note Beryle Lathe   Tue Nov 12, 2020  1:07 PM)    DULoxetine (CYMBALTA) 60 MG capsule [id number] Yes TAKE 1 CAPSULE BY MOUTH EVERY DAY Noreene Larsson, NP Taking Active   gabapentin (NEURONTIN) 300 MG capsule [id number] Yes TAKE ONE CAPSULE BY MOUTH TWICE DAILY  Patient taking differently:  daily.   Noreene Larsson, NP Taking Active   GLOBAL EASE INJECT PEN NEEDLES 31G X 8 MM MISC [id number]  1 each by Other route as directed. [provider]  Active Self  insulin detemir (LEVEMIR) 100 UNIT/ML FlexPen [id number] Yes Inject 35 Units into the skin 2 (two) times daily.  Patient taking differently: Inject 38 Units into the skin 2 (two) times daily.   Noreene Larsson, NP Taking Active   levothyroxine (SYNTHROID) 125 MCG tablet [id number] Yes TAKE 1 TABLET BY MOUTH EVERY MORNING ON AN EMPTY STOMACH Noreene Larsson, NP Taking Active   lisinopril (ZESTRIL) 10 MG tablet [id number] Yes TAKE 1 TABLET BY MOUTH DAILY Noreene Larsson, NP Taking Active   meloxicam (MOBIC) 7.5 MG tablet [id number] Yes TAKE 1 TABLET BY MOUTH DAILY Noreene Larsson, NP Taking Active   metFORMIN (GLUCOPHAGE) 1000 MG tablet [id number] Yes TAKE 1 TABLET BY MOUTH TWICE DAILY FOR 30 DAYS Noreene Larsson, NP Taking Active   morphine (MS CONTIN) 30 MG 12 hr tablet [id number] Yes Take 1 tablet by mouth 2 (two) times daily. Take with the $Remove'60mg'aurrjgB$   [provider] Taking Active Self  morphine (MS CONTIN) 60 MG 12 hr tablet [id number] Yes Take 1 tablet by mouth 2 (two) times daily. Takes with the $Remove'30mg'rjmNxVG$   [provider] Taking Active Self  Omega-3 Fatty Acids (FISH OIL) 1000 MG CAPS [id number] Yes Take 2 capsules (2,000 mg total) by mouth in the morning and at bedtime.  Patient taking differently: Take 1 capsule by mouth every morning.   Noreene Larsson, NP Taking Active   [facility] ULTRA test strip [id number]  USE UP TO FOUR TIMES DAILY AS DIRECTED (25 DAY supply) Noreene Larsson, NP  Active   simvastatin (ZOCOR) 40 MG tablet [id number] Yes TAKE 1 TABLET BY MOUTH EVERY EVENING Noreene Larsson, NP Taking Active   tiZANidine (ZANAFLEX) 4 MG tablet [id number] Yes Take 4 mg by mouth 3 (three) times daily as needed. [provider] Taking Active   traZODone (DESYREL) 50 MG tablet [id number] Yes Take 0.5-1 tablets (25-50 mg total) by mouth at bedtime as needed for sleep. 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 03/27/2020   Sleep apnea 01/31/2020   CPAP (continuous positive airway pressure) dependence 10/23/2019   Opioid contract exists 10/23/2019   Edentulous 10/23/2019   Insomnia, unspecified 10/23/2019   Anxiety 04/12/2019   Panic attacks 04/12/2019   Vitamin D deficiency 04/11/2019   Hypothyroidism 04/11/2019   Diabetes mellitus type 2, insulin dependent ([place]) 02/05/2017   Depression, recurrent ([place]) 02/05/2017   Hyperlipidemia associated with type 2 diabetes mellitus ([place]) 02/05/2017   Essential hypertension 02/05/2017    Immunization History  Administered Date(s) Administered   Influenza,inj,quad, With Preservative 12/16/2018   Moderna SARS-COV2 Booster Vaccination 04/30/2020   Moderna Sars-Covid-2 Vaccination 09/20/2019, 10/18/2019    Conditions to be addressed/monitored: HTN, HLD, and DMII  Care Plan : Medication Management  Updates made by Beryle Lathe, [place] since 11/12/2020 12:00 AM     Problem: T2DM, HTN, HLD   Priority: High  Onset Date: 10/22/2020     Long-Range Goal: Disease Progression Prevention   Start Date: 10/22/2020  Expected End Date: 01/20/2021  Recent Progress: On track  Priority: High  Note:   Current Barriers:  Unable to achieve control of  diabetes  Pharmacist Clinical Goal(s):  Over the next 90 days, patient will Achieve control of diabetes as evidenced by improved A1c through collaboration with PharmD and provider.   Interventions: 1:1 collaboration with Noreene Larsson, NP regarding development and update of comprehensive plan of care as evidenced by provider attestation and co-signature Inter-disciplinary care team collaboration (see longitudinal plan of care) Comprehensive medication review performed; medication list updated in electronic medical record  Type 2 Diabetes: Current medications: metformin 1,000 mg by mouth  twice daily with meals, dulaglutide (Trulicity) 3.81 mg subcutaneously weekly on Tuesday, and insulin detemir (Levemir) 38 units subcutaneously twice daily Recently discontinued Novolog Intolerances: none Taking medications as directed: no, using 38 units of Levemir twice daily Side effects thought to be attributed to current medication regimen: no Denies hypoglycemic/hyperglycemic symptoms Hypoglycemia prevention: not indicated at this time Current meal patterns: breakfast: eggs, sausage, and half slice of toast w/o butter ; lunch:  chilli dog ; dinner: Fried chicken, rice, pasta, vegetables, and friend shrimp ; snacks: cookies, fruit, sandwich, and cheese puffs ; drinks: water, regular soda, juice, and milk Current exercise:  yard work On a statin: $RemoveB'[x]'jXXnaQRp$  Yes  $Re'[]'NZJ$  No    Last microalbumin: 16.1 (04/29/20); on an ACEi/ARB: $RemoveBefo'[x]'VrvIZdZQVFo$  Yes  $Re'[]'TXx$  No    Last eye exam: "about a year ago" per patient. Patient plans to make appointment soon. Last foot exam: unknown Pneumonia vaccine: unknown Current glucose readings: patient brought log with the following trend: fasting: 180-290s; post-prandial 186-330; bedtime: 146-300 Uncontrolled; Most recent A1c above goal of <7% per ADA guidelines Introduction to diabetes basic facts Medication: Identify diabetes medication and when best taken Monitoring: target blood sugar range, when  to test Signs and symptoms of hyperglycemia Hypoglycemia: I have discussed with the patient how to treat hypoglycemia by the rule of 15; eat/drink 15g of sugar in the form of glucose tabs, 4 ounces of juice or soda and recheck fingerstick glucose in 15 minutes. Chocolate bars and ice cream should be avoided because fat delays carbohydrate digestion and absorption. Retreat if glucose remains low. Driving should cease until glucose is normal. Continue metformin 1,000 mg by mouth twice daily with meals, dulaglutide (Trulicity) 8.29 mg subcutaneously weekly on Tuesday, and insulin detemir (Levemir) 38 units subcutaneously twice daily Consider increasing Trulicity to 1.5 mg subcutaneously weekly after 1 month if able to tolerate. May also need to increase basal insulin requirement as well given consistently elevated blood glucose.  Instructed to monitor blood sugars twice a day at the following times: fasting (at least 8 hours since last food consumption), bedtime, and whenever patient experiences symptoms of hypo/hyperglycemia Encouraged regular aerobic exercise with a goal of 30 minutes five times per week (150 minutes per week) Discussed management of hypoglycemia. If blood sugar <70 at any time, treat with simple sugar such as 1/2 cup juice or regular soda or 3-4 glucose tablets. Recheck blood sugar in 15 minutes and repeat if blood sugar remains <70.  Patient identified as a good candidate for SGLT-2 inhibitor given reduction in cardiovascular disease, slowed chronic kidney disease progression, low risk of hypoglycemia, and weight loss. Patient denies a history of significant genitourinary infections. No concern for hypotension/volume depletion. GFR at least >20 mL/minute/1.73 m2. Can consider discussing further at future visit.  Patient brought Dexcom G6 transmitter and sensor with intention to place today and setup; however, his phone is not compatible. Unable to setup and place CGM today. Had patient  call DME supplier  during visit to request receiver device. DME supplier will plan to ship item today with plan for patient to receive later this week. Will have patient return next week for Dexcom placement.   Hypertension: Current medications: lisinopril 10 mg by mouth once daily Intolerances: none Taking medications as directed: yes Side effects thought to be attributed to current medication regimen: no Denies dizziness, lightheadedness, blurred vision, and headache Home blood pressure readings: not discussed today Blood pressure under good control. Blood pressure is at goal of <130/80 mmHg per 2017 AHA/ACC guidelines. Continue lisinopril 10 mg by mouth once daily Encourage dietary sodium restriction/DASH diet Recommend regular aerobic exercise  Hyperlipidemia: Current medications: simvastatin 40 mg by mouth at bedtime and fish oil 1 capsule by mouth every morning  Intolerances: none Taking medications as directed: yes Side effects thought to be attributed to current medication regimen: no Controlled; LDL at goal of <100 due to high risk given at least 2 major CV risk factors (diabetes and hypertension) and 10-year risk 10-20% per 2020 AACE/ACE guidelines and TG above goal of <150 per 2020 AACE/ACE guidelines Continue simvastatin 40 mg by mouth at bedtime Discussed need for and importance of continued work on weight loss Consider increasing fish oil to 2 capsules by mouth daily to slightly lower triglycerides  Patient Goals/Self-Care Activities Over the next 90 days, patient will:  Take medications as prescribed Check blood sugar twice a day at the following times: fasting (at least 8 hours since last food consumption), bedtime, and whenever patient experiences symptoms of hypo/hyperglycemia, document, and provide at future appointments Engage in dietary modifications by fewer sweetened foods & beverages and watch portion sizes/amount of food eaten at one time  Follow Up Plan: Face to  Face appointment with care management team member scheduled for: 11/19/20      Medication Assistance: None required.  Patient affirms current coverage meets needs.  Patient's preferred pharmacy is:  [place], [place] [address] [place] [address] Phone: [phone] Fax: [phone]  [facility] [address], [place] [address] Phone: [phone] Fax: [phone]  Follow Up:  Patient agrees to Care Plan and Follow-up.  Plan: Face to Face appointment with care management team member scheduled for: 11/19/20  Kennon Holter, PharmD Clinical Pharmacist [facility] [phone]

## 2020-11-19 ENCOUNTER — Ambulatory Visit: Payer: Medicare HMO

## 2020-11-21 ENCOUNTER — Ambulatory Visit: Payer: Medicare HMO

## 2020-11-21 ENCOUNTER — Other Ambulatory Visit: Payer: Self-pay

## 2020-11-21 ENCOUNTER — Ambulatory Visit (INDEPENDENT_AMBULATORY_CARE_PROVIDER_SITE_OTHER): Payer: Medicare HMO | Admitting: Licensed Clinical Social Worker

## 2020-11-21 DIAGNOSIS — F419 Anxiety disorder, unspecified: Secondary | ICD-10-CM

## 2020-11-22 ENCOUNTER — Other Ambulatory Visit: Payer: Self-pay

## 2020-11-22 ENCOUNTER — Telehealth: Payer: Self-pay

## 2020-11-22 ENCOUNTER — Ambulatory Visit: Payer: Medicare HMO | Admitting: Gastroenterology

## 2020-11-22 ENCOUNTER — Encounter: Payer: Self-pay | Admitting: Gastroenterology

## 2020-11-22 VITALS — BP 124/75 | HR 75 | Temp 97.7°F | Ht 68.0 in | Wt 245.2 lb

## 2020-11-22 DIAGNOSIS — E785 Hyperlipidemia, unspecified: Secondary | ICD-10-CM

## 2020-11-22 DIAGNOSIS — E119 Type 2 diabetes mellitus without complications: Secondary | ICD-10-CM | POA: Diagnosis not present

## 2020-11-22 DIAGNOSIS — E1169 Type 2 diabetes mellitus with other specified complication: Secondary | ICD-10-CM | POA: Diagnosis not present

## 2020-11-22 DIAGNOSIS — R945 Abnormal results of liver function studies: Secondary | ICD-10-CM | POA: Diagnosis not present

## 2020-11-22 DIAGNOSIS — Z1211 Encounter for screening for malignant neoplasm of colon: Secondary | ICD-10-CM

## 2020-11-22 DIAGNOSIS — Z794 Long term (current) use of insulin: Secondary | ICD-10-CM | POA: Diagnosis not present

## 2020-11-22 DIAGNOSIS — R7989 Other specified abnormal findings of blood chemistry: Secondary | ICD-10-CM | POA: Insufficient documentation

## 2020-11-22 DIAGNOSIS — I10 Essential (primary) hypertension: Secondary | ICD-10-CM

## 2020-11-22 MED ORDER — SUTAB 1479-225-188 MG PO TABS: ORAL_TABLET | ORAL | 0 refills | Status: DC

## 2020-11-22 NOTE — Progress Notes (Signed)
Primary Care Physician:  Noreene Larsson, NP  Primary Gastroenterologist:  Elon Alas. Abbey Chatters, DO   Chief Complaint  Patient presents with   Consult    TCS last done 10 years ago    HPI:  Thomas Rollins is a 60 y.o. male here at the request of Dr. Tula Nakayama for screening colonoscopy.  Previously did well with pill prep. Did not tolerate liquid preps in the past, resulted in profuse vomiting and hematemesis.  This will be his fourth colonoscopy.  He is unaware of any significant findings on prior studies.  He denies any family history of colon cancer although he has multiple malignancies in his family.  He has 2 daughters who passed away in their 71s from a "male cancer".  Mother passed away with stomach cancer in her 56s.  Brother with bone cancer and lung cancer.   Typically has to stay on top of his bowel movements given his chronic pain medication use.  Normally does have a stool each day with plenty of water and fiber intake.  Some days more productive than others.  Denies any melena or rectal bleeding.  No frequent heartburn.  No abdominal pain or vomiting.  No dysphagia.  History of chronically mildly elevated transaminases.  Previous hepatitis C antibody negative.  No prior ultrasound.  Current Outpatient Medications  Medication Sig Dispense Refill   albuterol (VENTOLIN HFA) 108 (90 Base) MCG/ACT inhaler Inhale 2 puffs into the lungs every 6 (six) hours as needed for wheezing or shortness of breath. 8 g 0   ALPHAGAN P 0.1 % SOLN Apply 1 drop to eye 2 (two) times daily.     ALPRAZolam (XANAX) 0.5 MG tablet Take 1 tablet (0.5 mg total) by mouth 2 (two) times daily as needed for anxiety. 14 tablet 0   AMBULATORY NON FORMULARY MEDICATION 1 vial by Intracavernosal route as needed. Medication Name:  Prostaglandin E1, 10 mcg vials.  Dispense 5 with 11 refills  Inject 1 vial as needed 5 vial 11   blood glucose meter kit and supplies Dispense based on patient and insurance  preference. Use up to four times daily as directed. (FOR ICD-10 E10.9, E11.9). 1 each 0   Continuous Blood Gluc Sensor (DEXCOM G6 SENSOR) MISC Change sensor every 10 days 3 each 11   Diclofenac Sodium 3 % GEL Apply 1 g topically 3 (three) times daily.     Dulaglutide (TRULICITY) 7.91 TA/5.6PV SOPN Inject 0.75 mg into the skin once a week. 2 mL 1   DULoxetine (CYMBALTA) 60 MG capsule TAKE 1 CAPSULE BY MOUTH EVERY DAY 90 capsule 0   gabapentin (NEURONTIN) 300 MG capsule TAKE ONE CAPSULE BY MOUTH TWICE DAILY (Patient taking differently: daily.) 60 capsule 10   GLOBAL EASE INJECT PEN NEEDLES 31G X 8 MM MISC 1 each by Other route as directed.     insulin detemir (LEVEMIR) 100 UNIT/ML FlexPen Inject 35 Units into the skin 2 (two) times daily. (Patient taking differently: Inject 40 Units into the skin 2 (two) times daily.) 15 mL 11   levothyroxine (SYNTHROID) 125 MCG tablet TAKE 1 TABLET BY MOUTH EVERY MORNING ON AN EMPTY STOMACH 30 tablet 10   lisinopril (ZESTRIL) 10 MG tablet TAKE 1 TABLET BY MOUTH DAILY 30 tablet 10   meloxicam (MOBIC) 7.5 MG tablet TAKE 1 TABLET BY MOUTH DAILY 30 tablet 10   metFORMIN (GLUCOPHAGE) 1000 MG tablet TAKE 1 TABLET BY MOUTH TWICE DAILY FOR 30 DAYS 60 tablet 3   morphine (  MS CONTIN) 30 MG 12 hr tablet Take 1 tablet by mouth 2 (two) times daily. Take with the $Remove'60mg'[id number]$       morphine (MS CONTIN) 60 MG 12 hr tablet Take 1 tablet by mouth 2 (two) times daily. Takes with the $Remove'30mg'[id number]$       Omega-3 Fatty Acids (FISH OIL) 1000 MG CAPS Take 2 capsules (2,000 mg total) by mouth in the morning and at bedtime. (Patient taking differently: Take 1 capsule by mouth every morning.) 360 capsule 3   ONETOUCH ULTRA test strip USE UP TO FOUR TIMES DAILY AS DIRECTED (25 DAY supply) 100 strip 11   simvastatin (ZOCOR) 40 MG tablet TAKE 1 TABLET BY MOUTH EVERY EVENING 30 tablet 3   traZODone (DESYREL) 50 MG tablet Take 0.5-1 tablets (25-50 mg total) by mouth at bedtime as needed for sleep. 30 tablet 3   No  current facility-administered medications for this visit.    Allergies as of 11/22/2020 - Review Complete 11/22/2020  Allergen Reaction Noted   Codeine Other (See Comments) 07/08/2016   Other  09/05/2017   Penicillins Rash 07/08/2016    Past Medical History:  Diagnosis Date   Abnormal LFTs    Anxiety    panic attacks   Chronic pain    Depression    Diabetes mellitus without complication ([facility])    Encounter for hepatitis C screening test for low risk patient 04/05/2019   Hyperlipidemia    Hypertension     Past Surgical History:  Procedure Laterality Date   ankle sx     ARTHROSCOPIC REPAIR ACL     CARPAL TUNNEL RELEASE     HERNIA REPAIR     SHOULDER ARTHROSCOPY      Family History  Problem Relation Age of Onset   Stomach cancer Mother 63   Heart Problems Father    Dementia Father    Cancer Father        "tumors"   Bone cancer Brother    Lung cancer Brother    Cancer Daughter 36       Male cancer   Cancer Daughter 51       Male   Colon cancer Neg Hx     Social History   Socioeconomic History   Marital status: Divorced    Spouse name: Not on file   Number of children: 1   Years of education: Not on file   Highest education level: 12th grade  Occupational History   Not on file  Tobacco Use   Smoking status: Former    Types: Cigarettes   Smokeless tobacco: Never  Vaping Use   Vaping Use: Never used  Substance and Sexual Activity   Alcohol use: Not Currently    Comment: none in 1 yr 10/2020   Drug use: Yes    Types: Marijuana    Comment: occas   Sexual activity: Never  Other Topics Concern   Not on file  Social History Narrative   Lives with good friend of his, known for 73 years    Only daughter passed away from cancer in 11/29/16       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   Takes time release morphine b/c fell 15-20 feet from a building while working.    Now disabled        Exercises/walks: 7 days a week   Has pets at home: 5 dogs in the  home.      Diet: Avoids white foods, fruits, veggies, does eat some fast  food/eat out    Caffeine: none only hot chocolate or soda when stomach is upset   Water: 8 cups daily if not more      Wears seat belt   Smoke detectors and carbon monoxide detectors at home    Does not use phone while driving                      Social Determinants of Corporate investment banker Strain: Low Risk    Difficulty of Paying Living Expenses: Not very hard  Food Insecurity: No Food Insecurity   Worried About Programme researcher, broadcasting/film/video in the Last Year: Never true   Ran Out of Food in the Last Year: Never true  Transportation Needs: No Transportation Needs   Lack of Transportation (Medical): No   Lack of Transportation (Non-Medical): No  Physical Activity: Sufficiently Active   Days of Exercise per Week: 4 days   Minutes of Exercise per Session: 40 min  Stress: Stress Concern Present   Feeling of Stress : To some extent  Social Connections: Socially Isolated   Frequency of Communication with Friends and Family: Twice a week   Frequency of Social Gatherings with Friends and Family: Never   Attends Religious Services: Never   Diplomatic Services operational officer: No   Attends Engineer, structural: Never   Marital Status: Divorced  Catering manager Violence: Not At Risk   Fear of Current or Ex-Partner: No   Emotionally Abused: No   Physically Abused: No   Sexually Abused: No      ROS:  General: Negative for anorexia, weight loss, fever, chills, fatigue, weakness. Eyes: Negative for vision changes.  ENT: Negative for hoarseness, difficulty swallowing , nasal congestion. CV: Negative for chest pain, angina, palpitations, dyspnea on exertion, peripheral edema.  Respiratory: Negative for dyspnea at rest, dyspnea on exertion, cough, sputum, wheezing.  GI: See history of present illness. GU:  Negative for dysuria, hematuria, urinary incontinence, urinary frequency, nocturnal  urination.  MS: Chronic pain related to falling 20 feet onto concrete, multiple injuries  Derm: Negative for rash or itching.  Neuro: Negative for weakness, abnormal sensation, seizure, frequent headaches, memory loss, confusion.  Psych: Negative for suicidal ideation, hallucinations.  Positive anxiety and depression. Endo: Negative for unusual weight change.  Heme: Negative for bruising or bleeding. Allergy: Negative for rash or hives.    Physical Examination:  BP 124/75   Pulse 75   Temp 97.7 F (36.5 C)   Ht 5\' 8"  (1.727 m)   Wt 245 lb 3.2 oz (111.2 kg)   BMI 37.28 kg/m    General: Well-nourished, well-developed in no acute distress.  Head: Normocephalic, atraumatic.   Eyes: Conjunctiva pink, no icterus. Mouth: masked Neck: Supple without thyromegaly, masses, or lymphadenopathy.  Lungs: Clear to auscultation bilaterally.  Heart: Regular rate and rhythm, no murmurs rubs or gallops.  Abdomen: Bowel sounds are normal, nontender, nondistended, no hepatosplenomegaly or masses, no abdominal bruits or    hernia , no rebound or guarding.  Exam limited by body habitus Rectal: not performed Extremities: No lower extremity edema. No clubbing or deformities.  Neuro: Alert and oriented x 4 , grossly normal neurologically.  Skin: Warm and dry, no rash or jaundice.   Psych: Alert and cooperative, normal mood and affect.  Labs: Lab Results  Component Value Date   CREATININE 0.84 08/02/2020   BUN 17 08/02/2020   NA 138 08/02/2020   K 4.5 08/02/2020  CL 99 08/02/2020   CO2 21 08/02/2020   Lab Results  Component Value Date   ALT 72 (H) 08/02/2020   AST 46 (H) 08/02/2020   ALKPHOS 66 08/02/2020   BILITOT 0.6 08/02/2020   Lab Results  Component Value Date   WBC 7.5 08/02/2020   HGB 15.4 08/02/2020   HCT 46.5 08/02/2020   MCV 86 08/02/2020   PLT 206 08/02/2020   Lab Results  Component Value Date   TSH 2.190 08/02/2020   Lab Results  Component Value Date   HGBA1C 8.5  (H) 08/02/2020     Imaging Studies: No results found.   Assessment:  Screening colonoscopy: last colonoscopy greater than 10 years ago. Has struggled with liquid bowel preps in the past but tolerated "pill" preps. Will provide Sutab. Encouraged to monitor bowels, has tendency to get constipated on narcotics but lately with diet has been having regular BMs. Will provide Linzess the week prior to colonoscopy as well.   Abnormal LFTS: mildly elevated AST/ALT chronically. Hep C ab negative. Suspect fatty liver given obesity and DM. Recommend limited work up with labs. Patient agreeable. He wants to hold off ultrasound of liver for now but may consider in the future.   Plan: Colonoscopy with Dr. Abbey Chatters. ASA III.  I have discussed the risks, alternatives, benefits with regards to but not limited to the risk of reaction to medication, bleeding, infection, perforation and the patient is agreeable to proceed. Written consent to be obtained. Hep B surface Ag, Hep B surface Ab, LFTs, iron/tbic/ferritin.

## 2020-11-22 NOTE — BH Specialist Note (Signed)
[place] Initial TeleMedicine Clinical Assessment  MRN: [id number] NAME: Thomas Rollins Date: 11/22/20  Start time: 230 End time: 3 Total time: 30  Types of Service: Telephone visit Referring Provider: Pearline Cables, NP Reason for Visit today: begin [facility] service  Patient/Family location: home [place] Provider location: home office All persons participating in visit: yes  I connected with patient and/or family via Telephone or Video Enabled Telemedicine Application  (Video is Caregility application) and verified that I am speaking with the correct person using two identifiers.   Discussed confidentiality: Yes   I discussed the limitations of telemedicine and the availability of in person appointments.  Discussed there is a possibility of technology failure and discussed alternative modes of communication if that failure occurs.  I discussed that engaging in this telemedicine visit, they consent to the provision of behavioral healthcare and the services will be billed under their insurance.  Patient and/or legal guardian expressed understanding and consented to Telemedicine visit: Yes   Treatment History Patient recently received Inpatient Treatment: No  Patient currently being seen by therapist/psychiatrist: No  Patient currently receiving the following services:   Past Psychiatric History/Diagnosis/Hospitalization(s): Anxiety: No Bipolar Disorder: No Depression: Yes Mania: No Psychosis: No Schizophrenia: No Personality Disorder: No Hospitalization for psychiatric illness: No History of Electroconvulsive Shock Therapy: No Prior Suicide Attempts: No  Decreased need for sleep: No  Euphoria: No  Self Injurious behaviors: No  Family History of mental illness: No  Family History of substance abuse: No  Substance Abuse: No  DUI: No  Insomnia: No   History of violence: No  Physical, sexual or emotional abuse: No  Prior outpatient mental health  therapy: No   Clinical Assessment  PHQ-9 & GAD-7 Assessments: This is an evidence based assessment tool for depression and anxiety symptoms in adolescents and adults.  Score cut-off points for each section are as follows: 5-9: Mild, 10-14: Moderate, 15+: Severe GAD 7 : Generalized Anxiety Score 11/01/2019 05/24/2019 04/12/2019  Nervous, Anxious, on Edge 0 1 1  Control/stop worrying 2 0 0  Worry too much - different things 2 0 0  Trouble relaxing 2 0 0  Restless 0 0 0  Easily annoyed or irritable 0 1 1  Afraid - awful might happen 0 1 3  Total GAD 7 Score _0 Anxiety Difficulty Somewhat difficult Somewhat difficult Somewhat difficult   GAD 7 : Generalized Anxiety Score 11/22/2020 11/01/2019 05/24/2019 04/12/2019  Nervous, Anxious, on Edge 3 0 1 1  Control/stop worrying 3 2 0 0  Worry too much - different things 3 2 0 0  Trouble relaxing 3 2 0 0  Restless 3 0 0 0  Easily annoyed or irritable 2 0 1 1  Afraid - awful might happen 2 0 1 3  Total GAD 7 Score _1 Anxiety Difficulty Very difficult Somewhat difficult Somewhat difficult Somewhat difficult        Social Functioning Social maturity: WNL Social judgement: WNL  Stress Current stressors: bereavement Familial stressors: bereavement Sleep: fair Appetite: fair Coping ability: overwhelmed Patient taking medications as prescribed:  yes  Current medications:  Outpatient Encounter Medications as of 10/31/2020  Medication Sig   albuterol (VENTOLIN HFA) 108 (90 Base) MCG/ACT inhaler Inhale 2 puffs into the lungs every 6 (six) hours as needed for wheezing or shortness of breath.   ALPHAGAN P 0.1 % SOLN Apply 1 drop to eye 2 (two) times daily.   ALPRAZolam (XANAX) 0.5 MG  tablet Take 1 tablet (0.5 mg total) by mouth 2 (two) times daily as needed for anxiety.   AMBULATORY NON FORMULARY MEDICATION 1 vial by Intracavernosal route as needed. Medication Name:  Prostaglandin E1, 10 mcg vials.  Dispense 5 with 11 refills  Inject 1  vial as needed   blood glucose meter kit and supplies Dispense based on patient and insurance preference. Use up to four times daily as directed. (FOR ICD-10 E10.9, E11.9).   Continuous Blood Gluc Sensor (DEXCOM G6 SENSOR) MISC Change sensor every 10 days   Diclofenac Sodium 3 % GEL Apply 1 g topically 3 (three) times daily.   Dulaglutide (TRULICITY) 6.07 PX/1.0GY SOPN Inject 0.75 mg into the skin once a week.   DULoxetine (CYMBALTA) 60 MG capsule TAKE 1 CAPSULE BY MOUTH EVERY DAY   gabapentin (NEURONTIN) 300 MG capsule TAKE ONE CAPSULE BY MOUTH TWICE DAILY (Patient taking differently: daily.)   GLOBAL EASE INJECT PEN NEEDLES 31G X 8 MM MISC 1 each by Other route as directed.   insulin detemir (LEVEMIR) 100 UNIT/ML FlexPen Inject 35 Units into the skin 2 (two) times daily. (Patient taking differently: Inject 40 Units into the skin 2 (two) times daily.)   levothyroxine (SYNTHROID) 125 MCG tablet TAKE 1 TABLET BY MOUTH EVERY MORNING ON AN EMPTY STOMACH   lisinopril (ZESTRIL) 10 MG tablet TAKE 1 TABLET BY MOUTH DAILY   meloxicam (MOBIC) 7.5 MG tablet TAKE 1 TABLET BY MOUTH DAILY   metFORMIN (GLUCOPHAGE) 1000 MG tablet TAKE 1 TABLET BY MOUTH TWICE DAILY FOR 30 DAYS   morphine (MS CONTIN) 30 MG 12 hr tablet Take 1 tablet by mouth 2 (two) times daily. Take with the 30m    morphine (MS CONTIN) 60 MG 12 hr tablet Take 1 tablet by mouth 2 (two) times daily. Takes with the 337m   Omega-3 Fatty Acids (FISH OIL) 1000 MG CAPS Take 2 capsules (2,000 mg total) by mouth in the morning and at bedtime. (Patient taking differently: Take 1 capsule by mouth every morning.)   ONETOUCH ULTRA test strip USE UP TO FOUR TIMES DAILY AS DIRECTED (25 DAY supply)   simvastatin (ZOCOR) 40 MG tablet TAKE 1 TABLET BY MOUTH EVERY EVENING   traZODone (DESYREL) 50 MG tablet Take 0.5-1 tablets (25-50 mg total) by mouth at bedtime as needed for sleep.   [DISCONTINUED] tiZANidine (ZANAFLEX) 4 MG tablet Take 4 mg by mouth 3 (three)  times daily as needed. (Patient not taking: Reported on 11/22/2020)   No facility-administered encounter medications on file as of 10/31/2020.     Self-harm and/or Suicidal Behaviors Risk Assessment Self-harm risk factors: no Patient endorses recent self injurious thoughts and/or behaviors: No  Suicide ideations: No plan to harm self or others   Danger to Others Risk Assessment Danger to others risk factors: no Patient endorses recent thoughts of harming others: No    Substance Use Assessment Patient recently consumed alcohol: No  Patient recently used drugs: No  Patient is concerned about dependence or abuse of substances: No    Goals, Interventions and Follow-up Plan Goals: Increase healthy adjustment to current life circumstances and Begin healthy grieving over loss Interventions: Solution-Focused Strategies and Mindfulness or Relaxation Training Follow-up Plan:  monthly vbh sessions  Summary of Clinical Assessment Summary: JoMarlons a [age]r old man who was referred by his PCP.  He reports that his daughter passed away 4 yr ago today and he is having a hard time with her death.  He reports  that she died prematurely due to cancer.  He raised his daughter with his exwife.  He reports that he has a hard time around holidays and anniversaries.   Therapist met with Patient in an initial therapy session to assess current mood and to build rapport. Therapist engaged Patient in discussion about life and what is going well for him. Therapist provided support for Patient as he shared details about life, current stressors, mood, coping skills,  past, and his children. Therapist prompted Patient to discuss support system and ways that he manages daily stress, anger, and frustrations.     Lubertha South, LCSW

## 2020-11-22 NOTE — Progress Notes (Signed)
No show

## 2020-11-22 NOTE — Telephone Encounter (Signed)
Tana Coast PA advised there was a Event organiser savings card online for [employer] too. Printed Sutab savings card for pt (not Medicare Part D) and placed with procedure instructions to be mailed. Unable to print savings card for Medicare Part D because info had to be put in that we do not have. Placed note on savings card printed to inform pt if he has Medicare Part D he would need to complete saving card info online.

## 2020-11-22 NOTE — Patient Instructions (Signed)
Colonoscopy as scheduled. See separate instructions. You will start Linzess (samples provided) one daily about a week before your colonoscopy to get bowels moving well. If you are having frequent stools you can take Linzess every other day instead. Please take our orders for labs when you go for labs with your PCP.   At some time you should consider ultrasound of your liver to evaluate for cause of elevated liver numbers. We can order this for you whenever you are ready.

## 2020-11-22 NOTE — BH Specialist Note (Signed)
[place] Follow Up Assessment  MRN: [id number] NAME: Thomas Rollins Date: 11/22/20  Start time: 82p End time: 4 Total time: 30  Type of Contact: Follow up Call  Current concerns/stressors: bereavement  Screens/Assessment Tools: GAD 7 : Generalized Anxiety Score 11/22/2020 11/22/2020 11/01/2019 05/24/2019  Nervous, Anxious, on Edge 2 3 0 1  Control/stop worrying 2 3 2  0  Worry too much - different things 2 3 2  0  Trouble relaxing 2 3 2  0  Restless 2 3 0 0  Easily annoyed or irritable 2 2 0 1  Afraid - awful might happen 2 2 0 1  Total GAD 7 Score 14 19 6 3   Anxiety Difficulty Somewhat difficult Very difficult Somewhat difficult Somewhat difficult     PHQ9 SCORE ONLY 11/22/2020 10/25/2020 09/17/2020  PHQ-9 Total Score 17 9 2      Functional Assessment:  Sleep: poor Appetite: fair Coping ability: overwhemed Patient taking medications as prescribed:  yes  Current medications:  Outpatient Encounter Medications as of 11/07/2020  Medication Sig   albuterol (VENTOLIN HFA) 108 (90 Base) MCG/ACT inhaler Inhale 2 puffs into the lungs every 6 (six) hours as needed for wheezing or shortness of breath.   ALPHAGAN P 0.1 % SOLN Apply 1 drop to eye 2 (two) times daily.   ALPRAZolam (XANAX) 0.5 MG tablet Take 1 tablet (0.5 mg total) by mouth 2 (two) times daily as needed for anxiety.   AMBULATORY NON FORMULARY MEDICATION 1 vial by Intracavernosal route as needed. Medication Name:  Prostaglandin E1, 10 mcg vials.  Dispense 5 with 11 refills  Inject 1 vial as needed   blood glucose meter kit and supplies Dispense based on patient and insurance preference. Use up to four times daily as directed. (FOR ICD-10 E10.9, E11.9).   Continuous Blood Gluc Sensor (DEXCOM G6 SENSOR) MISC Change sensor every 10 days   Diclofenac Sodium 3 % GEL Apply 1 g topically 3 (three) times daily.   Dulaglutide (TRULICITY) 9.19 TY/6.0AY SOPN Inject 0.75 mg into the skin once a week.   DULoxetine  (CYMBALTA) 60 MG capsule TAKE 1 CAPSULE BY MOUTH EVERY DAY   gabapentin (NEURONTIN) 300 MG capsule TAKE ONE CAPSULE BY MOUTH TWICE DAILY (Patient taking differently: daily.)   GLOBAL EASE INJECT PEN NEEDLES 31G X 8 MM MISC 1 each by Other route as directed.   insulin detemir (LEVEMIR) 100 UNIT/ML FlexPen Inject 35 Units into the skin 2 (two) times daily. (Patient taking differently: Inject 40 Units into the skin 2 (two) times daily.)   levothyroxine (SYNTHROID) 125 MCG tablet TAKE 1 TABLET BY MOUTH EVERY MORNING ON AN EMPTY STOMACH   lisinopril (ZESTRIL) 10 MG tablet TAKE 1 TABLET BY MOUTH DAILY   meloxicam (MOBIC) 7.5 MG tablet TAKE 1 TABLET BY MOUTH DAILY   metFORMIN (GLUCOPHAGE) 1000 MG tablet TAKE 1 TABLET BY MOUTH TWICE DAILY FOR 30 DAYS   morphine (MS CONTIN) 30 MG 12 hr tablet Take 1 tablet by mouth 2 (two) times daily. Take with the 60mg     morphine (MS CONTIN) 60 MG 12 hr tablet Take 1 tablet by mouth 2 (two) times daily. Takes with the 30mg     Omega-3 Fatty Acids (FISH OIL) 1000 MG CAPS Take 2 capsules (2,000 mg total) by mouth in the morning and at bedtime. (Patient taking differently: Take 1 capsule by mouth every morning.)   ONETOUCH ULTRA test strip USE UP TO FOUR TIMES DAILY AS DIRECTED (25 DAY supply)   simvastatin (ZOCOR)  40 MG tablet TAKE 1 TABLET BY MOUTH EVERY EVENING   traZODone (DESYREL) 50 MG tablet Take 0.5-1 tablets (25-50 mg total) by mouth at bedtime as needed for sleep.   [DISCONTINUED] tiZANidine (ZANAFLEX) 4 MG tablet Take 4 mg by mouth 3 (three) times daily as needed. (Patient not taking: Reported on 11/22/2020)   No facility-administered encounter medications on file as of 11/07/2020.    Self-harm and/or Suicidal Behaviors Risk Assessment Self-harm risk factors: no Patient endorses recent self injurious thoughts and/or behaviors: No   Suicide ideations: No plan to harm self or others   Danger to Others Risk Assessment Danger to others risk factors: no Patient  endorses recent thoughts of harming others: No    Substance Use Assessment Patient recently consumed alcohol: No  Patient recently used drugs: No  Patient is concerned about dependence or abuse of substances: No    Goals, Interventions and Follow-up Plan Goals: Begin healthy grieving over loss Interventions: Mindfulness or Relaxation Training   Summary of Clinical Assessment  Thomas Rollins is a 60 yr old man that presents for follow up.  He reports that he has been using coping skills lately to help with daughter's death.  He stated that last week was hard due to the anniversary of her death.  Thomas Rollins reports that he wants to try CBD oil to assist with relaxation. He was open to tracking his mood to help with behavioral activation. He has been able to plan activities with friends once this week to get out of the house.   Follow-up Plan:  weekly vbh session Lubertha South, LCSW

## 2020-11-23 ENCOUNTER — Other Ambulatory Visit: Payer: Self-pay | Admitting: Nurse Practitioner

## 2020-11-23 DIAGNOSIS — Z794 Long term (current) use of insulin: Secondary | ICD-10-CM

## 2020-11-23 DIAGNOSIS — E119 Type 2 diabetes mellitus without complications: Secondary | ICD-10-CM

## 2020-11-25 ENCOUNTER — Other Ambulatory Visit: Payer: Self-pay | Admitting: Nurse Practitioner

## 2020-11-25 DIAGNOSIS — E119 Type 2 diabetes mellitus without complications: Secondary | ICD-10-CM

## 2020-11-25 MED ORDER — TRULICITY 1.5 MG/0.5ML ~~LOC~~ SOAJ: 1.5000 mg | SUBCUTANEOUS | 1 refills | Status: DC

## 2020-11-27 ENCOUNTER — Telehealth (INDEPENDENT_AMBULATORY_CARE_PROVIDER_SITE_OTHER): Payer: Medicare HMO | Admitting: Licensed Clinical Social Worker

## 2020-11-27 DIAGNOSIS — F419 Anxiety disorder, unspecified: Secondary | ICD-10-CM

## 2020-11-27 NOTE — BH Specialist Note (Signed)
Virtual Behavioral Health Treatment Plan Team Note  MRN: [id number] NAME: Thomas Rollins  DATE: 11/27/20  Start time:   End time:   Total time:  5 min  Total number of Virtual [place] Treatment Team Plan encounters: 1/4  Treatment Team Attendees: Royal Piedra LCSW & Dr. Modesta Messing  Diagnoses: No diagnosis found.  Goals, Interventions and Follow-up Plan Goals: Begin healthy grieving over loss Interventions: Mindfulness or Relaxation Training Medication Management Recommendations: n/a Follow-up Plan: weekly vbh session  History of the present illness Presenting Problem/Current Symptoms: continued symptoms of DX  Psychiatric History  Depression: Yes Anxiety: No Mania: No Psychosis: No PTSD symptoms: No  Past Psychiatric History/Hospitalization(s): Hospitalization for psychiatric illness: No Prior Suicide Attempts: No Prior Self-injurious behavior: No  Psychosocial stressors anniversary of daughter;s death  Self-harm Behaviors Risk Assessment   Screenings PHQ-9 Assessments:  Depression screen [facility] 2/9 11/22/2020 10/25/2020 09/17/2020  Decreased Interest 3 0 0  Down, Depressed, Hopeless 3 3 0  PHQ - 2 Score 6 3 0  Altered sleeping 2 3 1   Tired, decreased energy 2 1 1   Change in appetite 2 1 0  Feeling bad or failure about yourself  2 0 0  Trouble concentrating 2 1 0  Moving slowly or fidgety/restless 1 0 0  Suicidal thoughts 0 0 0  PHQ-9 Score 17 9 2   Difficult doing work/chores Somewhat difficult Not difficult at all -  Some recent data might be hidden   GAD-7 Assessments:  GAD 7 : Generalized Anxiety Score 11/22/2020 11/22/2020 11/01/2019 05/24/2019  Nervous, Anxious, on Edge 2 3 0 1  Control/stop worrying 2 3 2  0  Worry too much - different things 2 3 2  0  Trouble relaxing 2 3 2  0  Restless 2 3 0 0  Easily annoyed or irritable 2 2 0 1  Afraid - awful might happen 2 2 0 1  Total GAD 7 Score 14 19 6 3   Anxiety Difficulty Somewhat difficult Very difficult  Somewhat difficult Somewhat difficult    Past Medical History Past Medical History:  Diagnosis Date   Abnormal LFTs    Anxiety    panic attacks   Chronic pain    Depression    Diabetes mellitus without complication (HCC)    Encounter for hepatitis C screening test for low risk patient 04/05/2019   Hyperlipidemia    Hypertension     Vital signs: There were no vitals filed for this visit.  Allergies:  Allergies as of 11/27/2020 - Review Complete 11/22/2020  Allergen Reaction Noted   Codeine Other (See Comments) 07/08/2016   Other  09/05/2017   Penicillins Rash 07/08/2016    Medication History Current medications:  Outpatient Encounter Medications as of 11/27/2020  Medication Sig   albuterol (VENTOLIN HFA) 108 (90 Base) MCG/ACT inhaler Inhale 2 puffs into the lungs every 6 (six) hours as needed for wheezing or shortness of breath.   ALPHAGAN P 0.1 % SOLN Apply 1 drop to eye 2 (two) times daily.   ALPRAZolam (XANAX) 0.5 MG tablet Take 1 tablet (0.5 mg total) by mouth 2 (two) times daily as needed for anxiety.   AMBULATORY NON FORMULARY MEDICATION 1 vial by Intracavernosal route as needed. Medication Name:  Prostaglandin E1, 10 mcg vials.  Dispense 5 with 11 refills  Inject 1 vial as needed   blood glucose meter kit and supplies Dispense based on patient and insurance preference. Use up to four times daily as directed. (FOR ICD-10 E10.9, E11.9).   Continuous Blood  Gluc Sensor (DEXCOM G6 SENSOR) MISC Change sensor every 10 days   Diclofenac Sodium 3 % GEL Apply 1 g topically 3 (three) times daily.   Dulaglutide (TRULICITY) 1.5 KC/1.2XN SOPN Inject 1.5 mg into the skin once a week.   DULoxetine (CYMBALTA) 60 MG capsule TAKE 1 CAPSULE BY MOUTH EVERY DAY   gabapentin (NEURONTIN) 300 MG capsule TAKE ONE CAPSULE BY MOUTH TWICE DAILY (Patient taking differently: daily.)   GLOBAL EASE INJECT PEN NEEDLES 31G X 8 MM MISC 1 each by Other route as directed.   insulin detemir (LEVEMIR) 100  UNIT/ML FlexPen Inject 35 Units into the skin 2 (two) times daily. (Patient taking differently: Inject 40 Units into the skin 2 (two) times daily.)   levothyroxine (SYNTHROID) 125 MCG tablet TAKE 1 TABLET BY MOUTH EVERY MORNING ON AN EMPTY STOMACH   lisinopril (ZESTRIL) 10 MG tablet TAKE 1 TABLET BY MOUTH DAILY   meloxicam (MOBIC) 7.5 MG tablet TAKE 1 TABLET BY MOUTH DAILY   metFORMIN (GLUCOPHAGE) 1000 MG tablet TAKE 1 TABLET BY MOUTH TWICE DAILY FOR 30 DAYS   morphine (MS CONTIN) 30 MG 12 hr tablet Take 1 tablet by mouth 2 (two) times daily. Take with the $Remove'60mg'DaAYEDQ$     morphine (MS CONTIN) 60 MG 12 hr tablet Take 1 tablet by mouth 2 (two) times daily. Takes with the $Remove'30mg'RqPIaNz$     Omega-3 Fatty Acids (FISH OIL) 1000 MG CAPS Take 2 capsules (2,000 mg total) by mouth in the morning and at bedtime. (Patient taking differently: Take 1 capsule by mouth every morning.)   ONETOUCH ULTRA test strip USE UP TO FOUR TIMES DAILY AS DIRECTED (25 DAY supply)   simvastatin (ZOCOR) 40 MG tablet TAKE 1 TABLET BY MOUTH EVERY EVENING   Sodium Sulfate-Mag Sulfate-KCl (SUTAB) [phone] MG TABS Take as directed.   traZODone (DESYREL) 50 MG tablet Take 0.5-1 tablets (25-50 mg total) by mouth at bedtime as needed for sleep.   No facility-administered encounter medications on file as of 11/27/2020.     Scribe for Treatment Team: Lubertha South, LCSW

## 2020-11-27 NOTE — Progress Notes (Signed)
[facility] ([facility]) Psychiatric Consultant Case Review   Thomas Rollins is a 60 y.o. year old male with a history of depression, diabetes, hypertension, hyperlipidemia. Worsening in depression in the context of grief of loss of his daughter from cancer. He would like to see a psychiatrist to get Xanax.   Assessment/Provisional Diagnosis # MDD Will refer to psychiatry based on his request.   Recommendation - Refer to psychiatry  (- he is on duloxetine 60 mg daily, gabapentin 300 mg twice a day, Xanax 0.5 mg twice a day as needed for anxiety) - BH specialist to provide supportive therapy.  Thank you for your consult. We will continue to follow the patient. Please contact [facility]  for any questions or concerns.   The above treatment considerations and suggestions are based on consultation with the [facility] specialist and/or PCP and a review of information available in the shared registry and the patient's Electronic Health Record (EHR). I have not personally examined the patient. All recommendations should be implemented with consideration of the patient's relevant prior history and current clinical status. Please feel free to call me with any questions about the care of this patient.

## 2020-12-03 ENCOUNTER — Other Ambulatory Visit: Payer: Self-pay

## 2020-12-03 ENCOUNTER — Ambulatory Visit (INDEPENDENT_AMBULATORY_CARE_PROVIDER_SITE_OTHER): Payer: Medicare HMO | Admitting: Pharmacist

## 2020-12-03 DIAGNOSIS — E119 Type 2 diabetes mellitus without complications: Secondary | ICD-10-CM | POA: Diagnosis not present

## 2020-12-03 DIAGNOSIS — Z794 Long term (current) use of insulin: Secondary | ICD-10-CM | POA: Diagnosis not present

## 2020-12-03 DIAGNOSIS — E1169 Type 2 diabetes mellitus with other specified complication: Secondary | ICD-10-CM

## 2020-12-03 DIAGNOSIS — E039 Hypothyroidism, unspecified: Secondary | ICD-10-CM | POA: Diagnosis not present

## 2020-12-03 DIAGNOSIS — I10 Essential (primary) hypertension: Secondary | ICD-10-CM

## 2020-12-03 NOTE — Patient Instructions (Signed)
Thomas Rollins,  It was great to talk to you today!  Please call me with any questions or concerns.   Visit Information  PATIENT GOALS:  Goals Addressed             This Visit's Progress    Medication Management       Patient Goals/Self-Care Activities Over the next 90 days, patient will:  Take medications as prescribed Check blood sugar continuously with continuous glucose monitor, document, and provide at future appointments Engage in dietary modifications by fewer sweetened foods & beverages and watch portion sizes/amount of food eaten at one time          Patient verbalizes understanding of instructions provided today and agrees to view in MyChart.   Face to Face appointment with care management team member scheduled for: 4 weeks  Domenic Moras, PharmD Clinical Pharmacist [facility] [phone]  Dexcom G6 Training  1. For help or to see the videos: [email or web address]  2. You should have a sensor, a transmitter, and a display device. - Each sensor is good for 10 days - Each transmitter is good for 3 months    3. You will still need your glucose meter - If you take acetaminophen 1000 mg every 6 hours or more - Any time your symptoms do not match the reading on your Dexcom - Any time you don't have a number and an arrow  If you have a MRI or CT scan - The company recommends you not wear the dexcom as it is unknown if the heat and magnetic fields could damage the components. - To get the most out of your session, we advise that you try to schedule your procedure near the end of your sensor session to avoid needing an extra sensor.  4. Change the site where you place the sensor with each new insertion.  If there is adhesive residue left on the skin, you can try applying a household oil like baby oil or coconut oil.  5. Avoid injecting insulin within 3 inches of the sensor.  6. Water. The transmitter is water  resistant, but the receiver is not.  You can still swim, shower, and take a bath.  However, your receiver needs to be in a dry area and within 20 feet of the transmitter to get readings.    7. If you want to use your smartphone: - Download the Dexcom G6 app.  You will need to create a Dexcom account.   - The app and the receiver do not talk to each other.  Therefore, you'll have to set up alerts in the app as well.   8. If you want to share your data with your provider. - Once you have downloaded the Dexcom G6 app, download the Dexcom Clarity app. - If you use the Dexcom G6 app, you will not need to upload data to a computer since the data is automatically sent to [facility] Clarity.   - The clinic will then invite you to share your data with the clinic's account in Dexcom Clarity.    Here is how the clinic will create an invitation to send to you:  Click Patients.  Click the patient's name you want to invite.  Click Share data.  Click Print an Invitation to view and print, or Email an Invitation, then enter the patient's email address.  Click Invite, then Close.  9. You can call Dexcom for help: [phone] - For an error message or issues with the  sensors: select option 2. - For help with data sharing: select option 3 for Patient Care. They are available Monday through Friday 9 am-8pm andSaturday 10 am-4:30 pm.   10.  The sensor glucose may not always be the same as your blood glucose. Sensor glucose is a glucose estimate that lags behind the blood glucose.

## 2020-12-03 NOTE — Chronic Care Management (AMB) (Addendum)
Chronic Care Management Pharmacy Note  12/03/2020 Name:  Thomas Rollins MRN:  [id number] DOB:  27-Sep-1960  Summary:  Patient did not bring blood glucose log today; states he has been "eating a bunch of junk" that he shouldn't be eating Dexcom G6 was placed in clinic today. Low blood glucose alarm was set for <70 and high blood glucose alarm was set for >300 per patient request. I would like to eventually decrease high blood glucose alarm to 250 once patient is able to improve blood glucose. Patient was instructed to monitor blood glucose continuously with Dexcom G6 and to use finger stick blood glucose checks as needed. Will follow-up with blood glucose via Dexcom and recommend adjustments to medications as necessary.   Subjective: Thomas Rollins is an 60 y.o. year old male who is a primary patient of Noreene Larsson, NP.  The CCM team was consulted for assistance with disease management and care coordination needs.    Engaged with patient face to face for follow up visit in response to provider referral for pharmacy case management and/or care coordination services.   Consent to Services:  The patient was given information about Chronic Care Management services, agreed to services, and gave verbal consent prior to initiation of services.  Please see initial visit note for detailed documentation.   Patient Care Team: Noreene Larsson, NP as PCP - General (Nurse Practitioner) Caren Macadam, MD as PCP - Family Medicine (Family Medicine) Eloise Harman, DO as Consulting Physician (Gastroenterology) Beryle Lathe, [facility] (Pharmacist)  Objective:  Lab Results  Component Value Date   CREATININE 0.84 08/02/2020   CREATININE 0.99 04/29/2020   CREATININE 0.75 (L) 03/27/2020    Lab Results  Component Value Date   HGBA1C 8.5 (H) 08/02/2020   Last diabetic Eye exam: No results found for: HMDIABEYEEXA  Last diabetic Foot exam: No results found for: HMDIABFOOTEX       Component Value Date/Time   CHOL 153 08/02/2020 1056   TRIG 159 (H) 08/02/2020 1056   HDL 41 08/02/2020 1056   CHOLHDL 3.7 02/05/2017 1101   LDLCALC 84 08/02/2020 1056   [place] 87 02/05/2017 1101    Hepatic Function Latest Ref Rng & Units 08/02/2020 04/29/2020 03/27/2020  Total Protein 6.0 - 8.5 g/dL 7.3 7.0 7.1  Albumin 3.8 - 4.8 g/dL 5.1(H) 4.5 4.8  AST 0 - 40 IU/L 46(H) 24 30  ALT 0 - 44 IU/L 72(H) 38 47(H)  Alk Phosphatase 44 - 121 IU/L 66 73 72  Total Bilirubin 0.0 - 1.2 mg/dL 0.6 0.4 0.3  Bilirubin, Direct 0.0 - 0.2 mg/dL - - -    Lab Results  Component Value Date/Time   TSH 2.190 08/02/2020 10:56 AM   TSH 1.430 03/07/2020 11:22 AM   FREET4 1.35 08/02/2020 10:56 AM   FREET4 <0.10 (L) 01/31/2020 11:28 AM    CBC Latest Ref Rng & Units 08/02/2020 04/29/2020 03/27/2020  WBC 3.4 - 10.8 x10E3/uL 7.5 6.1 8.1  Hemoglobin 13.0 - 17.7 g/dL 15.4 14.9 15.6  Hematocrit 37.5 - 51.0 % 46.5 45.4 46.1  Platelets 150 - 450 x10E3/uL 206 184 206    Lab Results  Component Value Date/Time   VD25OH 33.1 01/31/2020 11:28 AM    Clinical ASCVD: No  The 10-year ASCVD risk score (Arnett DK, et al., 2019) is: 18.8%   Values used to calculate the score:     Age: 63 years     Sex: Male     Is  Non-Hispanic African American: No     Diabetic: Yes     Tobacco smoker: No     Systolic Blood Pressure: 124 mmHg     Is BP treated: Yes     HDL Cholesterol: 41 mg/dL     Total Cholesterol: 153 mg/dL     Social History   Tobacco Use  Smoking Status Former   Types: Cigarettes  Smokeless Tobacco Never   BP Readings from Last 3 Encounters:  11/22/20 124/75  10/25/20 126/80  10/17/20 126/76   Pulse Readings from Last 3 Encounters:  11/22/20 75  10/25/20 80  10/17/20 84   Wt Readings from Last 3 Encounters:  11/22/20 245 lb 3.2 oz (111.2 kg)  10/25/20 238 lb (108 kg)  10/17/20 243 lb (110.2 kg)    Assessment: Review of patient past medical history, allergies, medications, health status,  including review of consultants reports, laboratory and other test data, was performed as part of comprehensive evaluation and provision of chronic care management services.   SDOH:  (Social Determinants of Health) assessments and interventions performed:    CCM Care Plan  Allergies  Allergen Reactions   Codeine Other (See Comments)    Slurred Speech    Other     [place], sweat potatoes,    Penicillins Rash    Medications Reviewed Today     Reviewed by Gavin Pound, [facility] (Pharmacist) on 12/03/20 at 1212  Med List Status: <None>   Medication Order Taking? Sig Documenting Provider Last Dose Status Informant  albuterol (VENTOLIN HFA) 108 (90 Base) MCG/ACT inhaler [id number] No Inhale 2 puffs into the lungs every 6 (six) hours as needed for wheezing or shortness of breath. Heather Roberts, NP Taking Active   ALPHAGAN P 0.1 % SOLN [id number] No Apply 1 drop to eye 2 (two) times daily. [provider] Taking Active            Med Note Jenean Lindau, KENDRICK   Thu Sep 07, 2019  1:25 PM) Takes as needed  ALPRAZolam Prudy Feeler) 0.5 MG tablet [id number] No Take 1 tablet (0.5 mg total) by mouth 2 (two) times daily as needed for anxiety. Heather Roberts, NP Taking Active   AMBULATORY NON FORMULARY MEDICATION [id number] No 1 vial by Intracavernosal route as needed. Medication Name:  Prostaglandin E1, 10 mcg vials.  Dispense 5 with 11 refills  Inject 1 vial as needed Marcine Matar, MD Taking Active   blood glucose meter kit and supplies [id number] No Dispense based on patient and insurance preference. Use up to four times daily as directed. (FOR ICD-10 E10.9, E11.9). Heather Roberts, NP Taking Active   Continuous Blood Gluc Sensor (DEXCOM G6 SENSOR) MISC [id number] No Change sensor every 10 days Heather Roberts, NP Taking Active   Diclofenac Sodium 3 % GEL [id number] No Apply 1 g topically 3 (three) times daily. [provider] Taking Active   Dulaglutide (TRULICITY) 1.5 MG/0.5ML SOPN  [id number]  Inject 1.5 mg into the skin once a week. Heather Roberts, NP  Active   DULoxetine (CYMBALTA) 60 MG capsule [id number] No TAKE 1 CAPSULE BY MOUTH EVERY DAY Heather Roberts, NP Taking Active   gabapentin (NEURONTIN) 300 MG capsule [id number] No TAKE ONE CAPSULE BY MOUTH TWICE DAILY  Patient taking differently: daily.   Heather Roberts, NP Taking Active   GLOBAL EASE INJECT PEN NEEDLES 31G X 8 MM MISC [id number] No 1 each by Other route as directed. [provider] Taking Active Self  insulin detemir (LEVEMIR) 100 UNIT/ML FlexPen [id number] No Inject 35 Units into the skin 2 (two) times daily.  Patient taking differently: Inject 40 Units into the skin 2 (two) times daily.   Noreene Larsson, NP Taking Active   levothyroxine (SYNTHROID) 125 MCG tablet [id number] No TAKE 1 TABLET BY MOUTH EVERY MORNING ON AN EMPTY STOMACH Noreene Larsson, NP Taking Active   lisinopril (ZESTRIL) 10 MG tablet [id number] No TAKE 1 TABLET BY MOUTH DAILY Noreene Larsson, NP Taking Active   meloxicam (MOBIC) 7.5 MG tablet [id number] No TAKE 1 TABLET BY MOUTH DAILY Noreene Larsson, NP Taking Active   metFORMIN (GLUCOPHAGE) 1000 MG tablet [id number] No TAKE 1 TABLET BY MOUTH TWICE DAILY FOR 30 DAYS Noreene Larsson, NP Taking Active   morphine (MS CONTIN) 30 MG 12 hr tablet [id number] No Take 1 tablet by mouth 2 (two) times daily. Take with the $Remove'60mg'FRcbTJZ$   [provider] Taking Active Self  morphine (MS CONTIN) 60 MG 12 hr tablet [id number] No Take 1 tablet by mouth 2 (two) times daily. Takes with the $Remove'30mg'tvMkDJF$   [provider] Taking Active Self  Omega-3 Fatty Acids (FISH OIL) 1000 MG CAPS [id number] No Take 2 capsules (2,000 mg total) by mouth in the morning and at bedtime.  Patient taking differently: Take 1 capsule by mouth every morning.   Noreene Larsson, NP Taking Active   [facility] ULTRA test strip [id number] No USE UP TO FOUR TIMES DAILY AS DIRECTED (25 DAY supply) Noreene Larsson, NP Taking Active   simvastatin  (ZOCOR) 40 MG tablet [id number] No TAKE 1 TABLET BY MOUTH EVERY EVENING Noreene Larsson, NP Taking Active   Sodium Sulfate-Mag Sulfate-KCl (SUTAB) 208 209 1103 MG TABS [id number]  Take as directed. Eloise Harman, DO  Active   traZODone (DESYREL) 50 MG tablet [id number] No Take 0.5-1 tablets (25-50 mg total) by mouth at bedtime as needed for sleep. 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 03/27/2020   Sleep apnea 01/31/2020   CPAP (continuous positive airway pressure) dependence 10/23/2019   Opioid contract exists 10/23/2019   Edentulous 10/23/2019   Insomnia, unspecified 10/23/2019   Anxiety 04/12/2019   Panic attacks 04/12/2019   Vitamin D deficiency 04/11/2019   Hypothyroidism 04/11/2019   Diabetes mellitus type 2, insulin dependent ([place]) 02/05/2017   Depression, recurrent ([place]) 02/05/2017   Hyperlipidemia associated with type 2 diabetes mellitus ([place]) 02/05/2017   Essential hypertension 02/05/2017    Immunization History  Administered Date(s) Administered   Influenza,inj,quad, With Preservative 12/16/2018   Moderna SARS-COV2 Booster Vaccination 04/30/2020   Moderna Sars-Covid-2 Vaccination 09/20/2019, 10/18/2019    Conditions to be addressed/monitored: HTN, HLD, and DMII  Care Plan : Medication Management  Updates made by Beryle Lathe, [place] since 12/03/2020 12:00 AM     Problem: T2DM, HTN, HLD   Priority: High  Onset Date: 10/22/2020     Long-Range Goal: Disease Progression Prevention   Start Date: 10/22/2020  Expected End Date: 01/20/2021  Recent Progress: On track  Priority: High  Note:   Current Barriers:  Unable to achieve control of diabetes  Pharmacist Clinical Goal(s):  Over the next 90 days, patient will Achieve control of diabetes as evidenced by improved A1c  through collaboration  with PharmD and provider.   Interventions: 1:1 collaboration with Noreene Larsson, NP regarding development and update of comprehensive plan of care as evidenced by provider attestation and co-signature Inter-disciplinary care team collaboration (see longitudinal plan of care) Comprehensive medication review performed; medication list updated in electronic medical record  Type 2 Diabetes: Current medications: metformin 1,000 mg by mouth twice daily with meals, dulaglutide (Trulicity) 6.29 mg subcutaneously weekly on Tuesday, and insulin detemir (Levemir) 40 units subcutaneously twice daily Intolerances: none Taking medications as directed: no, using 40 units of Levemir twice daily Side effects thought to be attributed to current medication regimen: no Denies hypoglycemic/hyperglycemic symptoms Hypoglycemia prevention: not indicated at this time Current meal patterns: breakfast: eggs, sausage, and half slice of toast w/o butter ; lunch:  chilli dog ; dinner: Fried chicken, rice, pasta, vegetables, and friend shrimp ; snacks: cookies, fruit, sandwich, and cheese puffs ; drinks: water, regular soda, juice, and milk Current exercise:  yard work On a statin: $RemoveB'[x]'fYyVUqzw$  Yes  $Re'[]'yvH$  No    Last microalbumin: 16.1 (04/29/20); on an ACEi/ARB: $RemoveBefo'[x]'mfeNlZhuxLW$  Yes  $Re'[]'XbM$  No    Last eye exam: "about a year ago" per patient. Patient plans to make appointment soon. Last foot exam: unknown Pneumonia vaccine: unknown Current glucose readings: patient did not bring log today; states he has been "eating a bunch of junk" that he shouldn't be eating Uncontrolled; Most recent A1c above goal of <7% per ADA guidelines Introduction to diabetes basic facts Medication: Identify diabetes medication and when best taken Monitoring: target blood sugar range, when to test Signs and symptoms of hyperglycemia Hypoglycemia: I have discussed with the patient how to treat hypoglycemia by the rule of 15; eat/drink 15g of sugar in the  form of glucose tabs, 4 ounces of juice or soda and recheck fingerstick glucose in 15 minutes. Chocolate bars and ice cream should be avoided because fat delays carbohydrate digestion and absorption. Retreat if glucose remains low. Driving should cease until glucose is normal. Continue metformin 1,000 mg by mouth twice daily with meals, dulaglutide (Trulicity) 5.28 mg subcutaneously weekly on Tuesday, and insulin detemir (Levemir) 40 units subcutaneously twice daily Consider increasing Trulicity to 1.5 mg subcutaneously weekly after 1 month if able to tolerate. May also need to increase basal insulin requirement as well if consistently elevated blood glucose.  Dexcom G6 was placed in clinic today. Low blood glucose alarm was set for <70 and high blood glucose alarm was set for >300 per patient request. I would like to eventually decrease high blood glucose alarm to 250 once patient is able to improve blood glucose. Patient was instructed to monitor blood glucose continuously with Dexcom G6 and to use finger stick blood glucose checks as needed. Will follow-up with blood glucose via Dexcom and recommend adjustments to medications as necessary.  Patient identified as a good candidate for SGLT-2 inhibitor given reduction in cardiovascular disease, slowed chronic kidney disease progression, low risk of hypoglycemia, and weight loss. Patient denies a history of significant genitourinary infections. No concern for hypotension/volume depletion. GFR at least >20 mL/minute/1.73 m2. Can consider discussing further at future visit.   Hypertension: Current medications: lisinopril 10 mg by mouth once daily Intolerances: none Taking medications as directed: yes Side effects thought to be attributed to current medication regimen: no Denies dizziness, lightheadedness, blurred vision, and headache Home blood pressure readings: not discussed today Blood pressure under good control. Blood pressure is at goal of <130/80  mmHg per 2017 AHA/ACC guidelines. Continue lisinopril 10 mg  by mouth once daily Encourage dietary sodium restriction/DASH diet Recommend regular aerobic exercise  Hyperlipidemia: Current medications: simvastatin 40 mg by mouth at bedtime and fish oil 1 capsule by mouth every morning  Intolerances: none Taking medications as directed: yes Side effects thought to be attributed to current medication regimen: no Controlled; LDL at goal of <100 due to high risk given at least 2 major CV risk factors (diabetes and hypertension) and 10-year risk 10-20% per 2020 AACE/ACE guidelines and TG above goal of <150 per 2020 AACE/ACE guidelines Continue simvastatin 40 mg by mouth at bedtime Discussed need for and importance of continued work on weight loss Consider increasing fish oil to 2 capsules by mouth daily to slightly lower triglycerides  Patient Goals/Self-Care Activities Over the next 90 days, patient will:  Take medications as prescribed Check blood sugar continuously with continuous glucose monitor, document, and provide at future appointments Engage in dietary modifications by fewer sweetened foods & beverages and watch portion sizes/amount of food eaten at one time  Follow Up Plan: Face to Face appointment with care management team member scheduled for: 4 weeks PCP appointment in 2 weeks      Medication Assistance: None required.  Patient affirms current coverage meets needs.  Patient's preferred pharmacy is:  [place], [place] [address] [place] [address] Phone: [phone] Fax: [phone]  [facility] [address] [place] [address] Phone: [phone] Fax: [phone]  Follow Up:  Patient agrees to Care Plan and Follow-up.  Plan: Face to Face appointment with care management team member scheduled for: 4 weeks and Next PCP appointment scheduled for: 2 weeks  Kennon Holter,  PharmD Clinical Pharmacist [facility] [phone]

## 2020-12-04 LAB — HEPATIC FUNCTION PANEL
ALT: 44 IU/L (ref 0–44)
AST: 28 IU/L (ref 0–40)
Albumin: 4.7 g/dL (ref 3.8–4.8)
Alkaline Phosphatase: 79 IU/L (ref 44–121)
Bilirubin Total: 0.4 mg/dL (ref 0.0–1.2)
Bilirubin, Direct: 0.13 mg/dL (ref 0.00–0.40)
Total Protein: 6.6 g/dL (ref 6.0–8.5)

## 2020-12-04 LAB — IRON,TIBC AND FERRITIN PANEL
Ferritin: 65 ng/mL (ref 30–400)
Iron Saturation: 22 % (ref 15–55)
Iron: 87 ug/dL (ref 38–169)
Total Iron Binding Capacity: 397 ug/dL (ref 250–450)
UIBC: 310 ug/dL (ref 111–343)

## 2020-12-04 LAB — HEPATITIS A ANTIBODY, TOTAL: hep A Total Ab: NEGATIVE

## 2020-12-04 LAB — HEPATITIS B SURFACE ANTIGEN: Hepatitis B Surface Ag: NEGATIVE

## 2020-12-04 LAB — HEPATITIS B SURFACE ANTIBODY,QUALITATIVE: Hep B Surface Ab, Qual: NONREACTIVE

## 2020-12-05 ENCOUNTER — Ambulatory Visit (INDEPENDENT_AMBULATORY_CARE_PROVIDER_SITE_OTHER): Payer: Medicare HMO | Admitting: Licensed Clinical Social Worker

## 2020-12-05 ENCOUNTER — Other Ambulatory Visit: Payer: Self-pay

## 2020-12-05 DIAGNOSIS — Z91199 Patient's noncompliance with other medical treatment and regimen due to unspecified reason: Secondary | ICD-10-CM

## 2020-12-05 DIAGNOSIS — E119 Type 2 diabetes mellitus without complications: Secondary | ICD-10-CM | POA: Diagnosis not present

## 2020-12-05 LAB — TSH+FREE T4
Free T4: 0.92 ng/dL (ref 0.82–1.77)
TSH: 7.98 u[IU]/mL — ABNORMAL HIGH (ref 0.450–4.500)

## 2020-12-05 LAB — HEMOGLOBIN A1C
Est. average glucose Bld gHb Est-mCnc: 232 mg/dL
Hgb A1c MFr Bld: 9.7 % — ABNORMAL HIGH (ref 4.8–5.6)

## 2020-12-05 LAB — CBC WITH DIFFERENTIAL/PLATELET
Basophils Absolute: 0.1 10*3/uL (ref 0.0–0.2)
Basos: 1 %
EOS (ABSOLUTE): 0.5 10*3/uL — ABNORMAL HIGH (ref 0.0–0.4)
Eos: 7 %
Hematocrit: 46.9 % (ref 37.5–51.0)
Hemoglobin: 15.5 g/dL (ref 13.0–17.7)
Immature Grans (Abs): 0 10*3/uL (ref 0.0–0.1)
Immature Granulocytes: 0 %
Lymphocytes Absolute: 2.6 10*3/uL (ref 0.7–3.1)
Lymphs: 37 %
MCH: 28.6 pg (ref 26.6–33.0)
MCHC: 33 g/dL (ref 31.5–35.7)
MCV: 87 fL (ref 79–97)
Monocytes Absolute: 0.5 10*3/uL (ref 0.1–0.9)
Monocytes: 8 %
Neutrophils Absolute: 3.4 10*3/uL (ref 1.4–7.0)
Neutrophils: 47 %
Platelets: 206 10*3/uL (ref 150–450)
RBC: 5.42 x10E6/uL (ref 4.14–5.80)
RDW: 14.4 % (ref 11.6–15.4)
WBC: 7.1 10*3/uL (ref 3.4–10.8)

## 2020-12-05 LAB — CMP14+EGFR
ALT: 43 IU/L (ref 0–44)
AST: 28 IU/L (ref 0–40)
Albumin/Globulin Ratio: 2.1 (ref 1.2–2.2)
Albumin: 4.6 g/dL (ref 3.8–4.8)
Alkaline Phosphatase: 83 IU/L (ref 44–121)
BUN/Creatinine Ratio: 26 — ABNORMAL HIGH (ref 10–24)
BUN: 23 mg/dL (ref 8–27)
Bilirubin Total: 0.5 mg/dL (ref 0.0–1.2)
CO2: 19 mmol/L — ABNORMAL LOW (ref 20–29)
Calcium: 9.6 mg/dL (ref 8.6–10.2)
Chloride: 96 mmol/L (ref 96–106)
Creatinine, Ser: 0.89 mg/dL (ref 0.76–1.27)
Globulin, Total: 2.2 g/dL (ref 1.5–4.5)
Glucose: 291 mg/dL — ABNORMAL HIGH (ref 70–99)
Potassium: 4.7 mmol/L (ref 3.5–5.2)
Sodium: 134 mmol/L (ref 134–144)
Total Protein: 6.8 g/dL (ref 6.0–8.5)
eGFR: 97 mL/min/{1.73_m2} (ref >59)

## 2020-12-05 LAB — LIPID PANEL WITH LDL/HDL RATIO
Cholesterol, Total: 138 mg/dL (ref 100–199)
HDL: 36 mg/dL — ABNORMAL LOW (ref >39)
LDL Chol Calc (NIH): 70 mg/dL (ref 0–99)
LDL/HDL Ratio: 1.9 ratio (ref 0.0–3.6)
Triglycerides: 193 mg/dL — ABNORMAL HIGH (ref 0–149)
VLDL Cholesterol Cal: 32 mg/dL (ref 5–40)

## 2020-12-10 ENCOUNTER — Ambulatory Visit: Payer: Medicare HMO | Admitting: Urology

## 2020-12-12 ENCOUNTER — Other Ambulatory Visit: Payer: Self-pay | Admitting: Nurse Practitioner

## 2020-12-12 DIAGNOSIS — Z23 Encounter for immunization: Secondary | ICD-10-CM | POA: Diagnosis not present

## 2020-12-19 ENCOUNTER — Encounter (HOSPITAL_COMMUNITY)
Admission: RE | Admit: 2020-12-19 | Discharge: 2020-12-19 | Disposition: A | Discharge status: Home / Self Care | Payer: Medicare HMO | Source: Ambulatory Visit | Attending: Internal Medicine | Admitting: Internal Medicine

## 2020-12-19 ENCOUNTER — Telehealth: Payer: Self-pay | Admitting: *Deleted

## 2020-12-19 NOTE — Telephone Encounter (Signed)
Pre-op appt letter mailed. Called pt, LMOVM with pre-op appt details

## 2020-12-19 NOTE — Telephone Encounter (Signed)
Received call from patient he has been sick x 2 days with stomach bug, vomiting/diarrhea. He needs to r/s procedure for Monday. Patient rescheduled to 11/14 at 8:30am. Aware will mail new instructions and will let know when new pre-op appt is. Message sent to endo

## 2020-12-19 NOTE — Patient Instructions (Signed)
Gerlad Veron Senner  12/19/2020     @PREFPERIOPPHARMACY @   Your procedure is scheduled on 12/23/2020.   Report to 12/25/2020 at [phone] A.M.   Call this number if you have problems the morning of surgery:  [phone]   Remember:  Follow the diet and prep instructions given to you by the office.    Take 20 units of your evening levemir the night before your procedure.    DO NOT take any medications for diabetes the morning of your procedure.    Take these medicines the morning of surgery with A SIP OF WATER      flexeril(if needed), cymbalta, gabapentin, levothyroxine, mobic or MS Contin (if needed).     Do not wear jewelry, make-up or nail polish.  Do not wear lotions, powders, or perfumes, or deodorant.  Do not shave 48 hours prior to surgery.  Men may shave face and neck.  Do not bring valuables to the hospital.  [facility] is not responsible for any belongings or valuables.  Contacts, dentures or bridgework may not be worn into surgery.  Leave your suitcase in the car.  After surgery it may be brought to your room.  For patients admitted to the hospital, discharge time will be determined by your treatment team.  Patients discharged the day of surgery will not be allowed to drive home and must have someone with them for 24 hours.    Special instructions:   DO NOT smoke tobacco or vape for 24 hours before your procedure.  Please read over the following fact sheets that you were given. Anesthesia Post-op Instructions and Care and Recovery After Surgery      Colonoscopy, Adult, Care After This sheet gives you information about how to care for yourself after your procedure. Your health care provider may also give you more specific instructions. If you have problems or questions, contact your health care provider. What can I expect after the procedure? After the procedure, it is common to have: A small amount of blood in your stool for 24 hours after the  procedure. Some gas. Mild cramping or bloating of your abdomen. Follow these instructions at home: Eating and drinking  Drink enough fluid to keep your urine pale yellow. Follow instructions from your health care provider about eating or drinking restrictions. Resume your normal diet as instructed by your health care provider. Avoid heavy or fried foods that are hard to digest. Activity Rest as told by your health care provider. Avoid sitting for a long time without moving. Get up to take short walks every 1-2 hours. This is important to improve blood flow and breathing. Ask for help if you feel weak or unsteady. Return to your normal activities as told by your health care provider. Ask your health care provider what activities are safe for you. Managing cramping and bloating  Try walking around when you have cramps or feel bloated. Apply heat to your abdomen as told by your health care provider. Use the heat source that your health care provider recommends, such as a moist heat pack or a heating pad. Place a towel between your skin and the heat source. Leave the heat on for 20-30 minutes. Remove the heat if your skin turns bright red. This is especially important if you are unable to feel pain, heat, or cold. You may have a greater risk of getting burned. General instructions If you were given a sedative during the procedure, it can  affect you for several hours. Do not drive or operate machinery until your health care provider says that it is safe. For the first 24 hours after the procedure: Do not sign important documents. Do not drink alcohol. Do your regular daily activities at a slower pace than normal. Eat soft foods that are easy to digest. Take over-the-counter and prescription medicines only as told by your health care provider. Keep all follow-up visits as told by your health care provider. This is important. Contact a health care provider if: You have blood in your stool 2-3  days after the procedure. Get help right away if you have: More than a small spotting of blood in your stool. Large blood clots in your stool. Swelling of your abdomen. Nausea or vomiting. A fever. Increasing pain in your abdomen that is not relieved with medicine. Summary After the procedure, it is common to have a small amount of blood in your stool. You may also have mild cramping and bloating of your abdomen. If you were given a sedative during the procedure, it can affect you for several hours. Do not drive or operate machinery until your health care provider says that it is safe. Get help right away if you have a lot of blood in your stool, nausea or vomiting, a fever, or increased pain in your abdomen. This information is not intended to replace advice given to you by your health care provider. Make sure you discuss any questions you have with your health care provider. Document Revised: 02/03/2019 Document Reviewed: 09/05/2018 Elsevier Patient Education  2022 [employer] Monitored Anesthesia Care, Care After This sheet gives you information about how to care for yourself after your procedure. Your health care provider may also give you more specific instructions. If you have problems or questions, contact your health care provider. What can I expect after the procedure? After the procedure, it is common to have: Tiredness. Forgetfulness about what happened after the procedure. Impaired judgment for important decisions. Nausea or vomiting. Some difficulty with balance. Follow these instructions at home: For the time period you were told by your health care provider:   Rest as needed. Do not participate in activities where you could fall or become injured. Do not drive or use machinery. Do not drink alcohol. Do not take sleeping pills or medicines that cause drowsiness. Do not make important decisions or sign legal documents. Do not take care of children on your own. Eating  and drinking Follow the diet that is recommended by your health care provider. Drink enough fluid to keep your urine pale yellow. If you vomit: Drink water, juice, or soup when you can drink without vomiting. Make sure you have little or no nausea before eating solid foods. General instructions Have a responsible adult stay with you for the time you are told. It is important to have someone help care for you until you are awake and alert. Take over-the-counter and prescription medicines only as told by your health care provider. If you have sleep apnea, surgery and certain medicines can increase your risk for breathing problems. Follow instructions from your health care provider about wearing your sleep device: Anytime you are sleeping, including during daytime naps. While taking prescription pain medicines, sleeping medicines, or medicines that make you drowsy. Avoid smoking. Keep all follow-up visits as told by your health care provider. This is important. Contact a health care provider if: You keep feeling nauseous or you keep vomiting. You feel light-headed. You are still sleepy  or having trouble with balance after 24 hours. You develop a rash. You have a fever. You have redness or swelling around the IV site. Get help right away if: You have trouble breathing. You have new-onset confusion at home. Summary For several hours after your procedure, you may feel tired. You may also be forgetful and have poor judgment. Have a responsible adult stay with you for the time you are told. It is important to have someone help care for you until you are awake and alert. Rest as told. Do not drive or operate machinery. Do not drink alcohol or take sleeping pills. Get help right away if you have trouble breathing, or if you suddenly become confused. This information is not intended to replace advice given to you by your health care provider. Make sure you discuss any questions you have with your  health care provider. Document Revised: 10/26/2019 Document Reviewed: 01/12/2019 Elsevier Patient Education  2022 [employer].

## 2020-12-20 ENCOUNTER — Other Ambulatory Visit: Payer: Self-pay

## 2020-12-20 ENCOUNTER — Encounter: Payer: Self-pay | Admitting: Internal Medicine

## 2020-12-20 ENCOUNTER — Ambulatory Visit (INDEPENDENT_AMBULATORY_CARE_PROVIDER_SITE_OTHER): Payer: Medicare HMO | Admitting: Internal Medicine

## 2020-12-20 DIAGNOSIS — Z794 Long term (current) use of insulin: Secondary | ICD-10-CM

## 2020-12-20 DIAGNOSIS — E039 Hypothyroidism, unspecified: Secondary | ICD-10-CM

## 2020-12-20 DIAGNOSIS — K529 Noninfective gastroenteritis and colitis, unspecified: Secondary | ICD-10-CM

## 2020-12-20 DIAGNOSIS — E119 Type 2 diabetes mellitus without complications: Secondary | ICD-10-CM | POA: Diagnosis not present

## 2020-12-20 MED ORDER — LEVOTHYROXINE SODIUM 137 MCG PO TABS: 137.0000 ug | ORAL_TABLET | Freq: Every morning | ORAL | 2 refills | Status: DC

## 2020-12-20 MED ORDER — AZITHROMYCIN 500 MG PO TABS: 500.0000 mg | ORAL_TABLET | Freq: Every day | ORAL | 0 refills | Status: DC

## 2020-12-20 NOTE — Progress Notes (Signed)
Virtual Visit via Telephone Note   This visit type was conducted due to national recommendations for restrictions regarding the COVID-19 Pandemic (e.g. social distancing) in an effort to limit this patient's exposure and mitigate transmission in our community.  Due to his co-morbid illnesses, this patient is at least at moderate risk for complications without adequate follow up.  This format is felt to be most appropriate for this patient at this time.  The patient did not have access to video technology/had technical difficulties with video requiring transitioning to audio format only (telephone).  All issues noted in this document were discussed and addressed.  No physical exam could be performed with this format.  Please refer to the patient's chart for his  consent to telehealth for [facility].   Evaluation Performed:  Follow-up visit  Date:  12/20/2020   ID:  Thomas Rollins, DOB Sep 20, 1960, MRN [id number]  Patient Location: Home Provider Location: Office/Clinic  Location of Patient: Home Location of Provider: Telehealth Consent was obtain for visit to be over via telehealth. I verified that I am speaking with the correct person using two identifiers.  PCP:  Noreene Larsson, NP   Chief Complaint: Follow-up of DM and blood test review  History of Present Illness:    Thomas Rollins is a 60 y.o. male who has a televisit for f/u of DM and blood test review.  Type II DM: He has been taking metformin, Levemir 40 units twice daily.  His HbA1c was 9.7, which is worse than prior.  He has Dexcom now and states that his blood glucose has been improving now.  He has had 3 instances where his blood glucose was more than 300.  He admits that he needs to improve his diet.  Hypothyroidism: His TSH was elevated.  He has been taking levothyroxine 125 mcg regularly.  Denies any recent change in weight or appetite.  He complains of having diarrhea, nausea and vomiting for last 3 to  4 days.  He has mild fever and chills as well.  Denies any melena or hematochezia.  He had flu and booster dose of COVID vaccine in the last week.  The patient does not have symptoms concerning for COVID-19 infection (fever, chills, cough, or new shortness of breath).   Past Medical, Surgical, Social History, Allergies, and Medications have been Reviewed.  Past Medical History:  Diagnosis Date   Abnormal LFTs    Anxiety    panic attacks   Chronic pain    Depression    Diabetes mellitus without complication ([place])    Encounter for hepatitis C screening test for low risk patient 04/05/2019   Hyperlipidemia    Hypertension    Past Surgical History:  Procedure Laterality Date   ankle sx     ARTHROSCOPIC REPAIR ACL     CARPAL TUNNEL RELEASE     HERNIA REPAIR     SHOULDER ARTHROSCOPY       Current Meds  Medication Sig   albuterol (VENTOLIN HFA) 108 (90 Base) MCG/ACT inhaler Inhale 2 puffs into the lungs every 6 (six) hours as needed for wheezing or shortness of breath.   ALPHAGAN P 0.1 % SOLN Place 1 drop into both eyes 2 (two) times daily as needed (irritation).   ALPRAZolam (XANAX) 0.5 MG tablet Take 1 tablet (0.5 mg total) by mouth 2 (two) times daily as needed for anxiety.   AMBULATORY NON FORMULARY MEDICATION 1 vial by Intracavernosal route as needed. Medication Name:  Prostaglandin E1, 10 mcg vials.  Dispense 5 with 11 refills  Inject 1 vial as needed   blood glucose meter kit and supplies Dispense based on patient and insurance preference. Use up to four times daily as directed. (FOR ICD-10 E10.9, E11.9).   Continuous Blood Gluc Sensor (DEXCOM G6 SENSOR) MISC Change sensor every 10 days   cyclobenzaprine (FLEXERIL) 10 MG tablet Take 10 mg by mouth 3 (three) times daily as needed for muscle spasms.   Diclofenac Sodium 3 % GEL Apply 1 g topically 4 (four) times daily as needed (pain).   Dulaglutide (TRULICITY) 1.5 UD/1.4HF SOPN Inject 1.5 mg into the skin once a week.    DULoxetine (CYMBALTA) 60 MG capsule TAKE 1 CAPSULE BY MOUTH EVERY DAY   gabapentin (NEURONTIN) 300 MG capsule TAKE ONE CAPSULE BY MOUTH TWICE DAILY (Patient taking differently: Take 300 mg by mouth daily.)   GLOBAL EASE INJECT PEN NEEDLES 31G X 8 MM MISC 1 each by Other route as directed.   ibuprofen (ADVIL) 200 MG tablet Take 400 mg by mouth every 6 (six) hours as needed for moderate pain or headache.   insulin detemir (LEVEMIR) 100 UNIT/ML FlexPen Inject 35 Units into the skin 2 (two) times daily. (Patient taking differently: Inject 45 Units into the skin 2 (two) times daily.)   lisinopril (ZESTRIL) 10 MG tablet TAKE 1 TABLET BY MOUTH DAILY   meloxicam (MOBIC) 7.5 MG tablet TAKE 1 TABLET BY MOUTH DAILY   metFORMIN (GLUCOPHAGE) 1000 MG tablet TAKE 1 TABLET BY MOUTH TWICE DAILY FOR 30 DAYS   morphine (MS CONTIN) 30 MG 12 hr tablet Take 1 tablet by mouth 2 (two) times daily. Take with the $Remove'60mg'BzHsAdw$     morphine (MS CONTIN) 60 MG 12 hr tablet Take 1 tablet by mouth 2 (two) times daily. Takes with the $Remove'30mg'ZDbhesl$     naloxone (NARCAN) nasal spray 4 mg/0.1 mL Place 1 spray into the nose as needed (opioid overdose).   Omega-3 Fatty Acids (FISH OIL) 1000 MG CAPS Take 2 capsules (2,000 mg total) by mouth in the morning and at bedtime. (Patient taking differently: Take 1 capsule by mouth every morning.)   ONETOUCH ULTRA test strip USE UP TO FOUR TIMES DAILY AS DIRECTED (25 DAY supply)   simvastatin (ZOCOR) 40 MG tablet TAKE 1 TABLET BY MOUTH EVERY EVENING   Sodium Sulfate-Mag Sulfate-KCl (SUTAB) [phone] MG TABS Take as directed.   traZODone (DESYREL) 50 MG tablet Take 0.5-1 tablets (25-50 mg total) by mouth at bedtime as needed for sleep. (Patient taking differently: Take 25 mg by mouth at bedtime.)   [DISCONTINUED] levothyroxine (SYNTHROID) 125 MCG tablet TAKE 1 TABLET BY MOUTH EVERY MORNING ON AN EMPTY STOMACH     Allergies:   Codeine, Other, and Penicillins   ROS:   Please see the history of present illness.      All other systems reviewed and are negative.   Labs/Other Tests and Data Reviewed:    Recent Labs: 12/03/2020: ALT 43; BUN 23; Creatinine, Ser 0.89; Hemoglobin 15.5; Platelets 206; Potassium 4.7; Sodium 134; TSH 7.980   Recent Lipid Panel Lab Results  Component Value Date/Time   CHOL 138 12/03/2020 10:26 AM   TRIG 193 (H) 12/03/2020 10:26 AM   HDL 36 (L) 12/03/2020 10:26 AM   CHOLHDL 3.7 02/05/2017 11:01 AM   LDLCALC 70 12/03/2020 10:26 AM   LDLCALC 87 02/05/2017 11:01 AM    Wt Readings from Last 3 Encounters:  11/22/20 245 lb 3.2 oz (111.2 kg)  10/25/20 238 lb (108 kg)  10/17/20 243 lb (110.2 kg)    ASSESSMENT & PLAN:    Type II DM, insulin-dependent with HLD Increased Levemir to 45 units twice daily Continue Trulicity and metformin for now Needs to follow diabetic diet Continue using Dexcom for CGM Continue follow-up with clinical pharmacist  Hypothyroidism Lab Results  Component Value Date   TSH 7.980 (H) 12/03/2020   Increased dose of Levothyroxine to 137 mcg QD Check TSH and free T4 in the next visit  Gastroenteritis Advised for proper hydration Diarrhea Main complaint, nausea and vomiting improving Started azithromycin as a concern for bacterial gastroenteritis  Time:   Today, I have spent 25 minutes reviewing the chart, including problem list, medications, and with the patient with telehealth technology discussing the above problems.   Medication Adjustments/Labs and Tests Ordered: Current medicines are reviewed at length with the patient today.  Concerns regarding medicines are outlined above.   Tests Ordered: No orders of the defined types were placed in this encounter.   Medication Changes: Meds ordered this encounter  Medications   levothyroxine (SYNTHROID) 137 MCG tablet    Sig: Take 1 tablet (137 mcg total) by mouth every morning. on an empty stomach    Dispense:  30 tablet    Refill:  2    Changed dose.     Note: This dictation  was prepared with Dragon dictation along with smaller phrase technology. Similar sounding words can be transcribed inadequately or may not be corrected upon review. Any transcriptional errors that result from this process are unintentional.      Disposition:  Follow up  Signed, Lindell Spar, MD  12/20/2020 8:58 AM     [place]

## 2020-12-23 DIAGNOSIS — E785 Hyperlipidemia, unspecified: Secondary | ICD-10-CM

## 2020-12-23 DIAGNOSIS — Z794 Long term (current) use of insulin: Secondary | ICD-10-CM

## 2020-12-23 DIAGNOSIS — I10 Essential (primary) hypertension: Secondary | ICD-10-CM

## 2020-12-23 DIAGNOSIS — E1169 Type 2 diabetes mellitus with other specified complication: Secondary | ICD-10-CM | POA: Diagnosis not present

## 2020-12-23 DIAGNOSIS — E119 Type 2 diabetes mellitus without complications: Secondary | ICD-10-CM | POA: Diagnosis not present

## 2020-12-26 ENCOUNTER — Ambulatory Visit (INDEPENDENT_AMBULATORY_CARE_PROVIDER_SITE_OTHER): Payer: Medicare HMO | Admitting: Licensed Clinical Social Worker

## 2020-12-26 ENCOUNTER — Other Ambulatory Visit: Payer: Self-pay

## 2020-12-26 DIAGNOSIS — Z91199 Patient's noncompliance with other medical treatment and regimen due to unspecified reason: Secondary | ICD-10-CM

## 2020-12-31 ENCOUNTER — Ambulatory Visit: Payer: Medicare HMO | Admitting: Urology

## 2021-01-01 ENCOUNTER — Ambulatory Visit: Payer: Medicare HMO

## 2021-01-01 DIAGNOSIS — M4722 Other spondylosis with radiculopathy, cervical region: Secondary | ICD-10-CM | POA: Diagnosis not present

## 2021-01-01 DIAGNOSIS — E1140 Type 2 diabetes mellitus with diabetic neuropathy, unspecified: Secondary | ICD-10-CM | POA: Diagnosis not present

## 2021-01-01 NOTE — Patient Instructions (Signed)
Thomas Rollins  01/01/2021     @PREFPERIOPPHARMACY @   Your procedure is scheduled on  01/06/2021.   Report to 01/08/2021 at  0700 A.M.   Call this number if you have problems the morning of surgery:  [phone]   Remember:  Follow the diet and prep instructions given to you by the office.     Your last dose of trulicity should have been 12/30/2020.     01/05/2021, Only take 17 units of levemir twice daily and tale your metformin twice daily.    DO NOT take any medications for diabetes the morning of your procedure.     Use your inhaler before your come and bring your rescue inhaler with you.    Take these medicines the morning of surgery with A SIP OF WATER     xanax (if needed), flexeril (if needed), cymbalta, gabapentin, levothyroxine,Mobic or MS Contin(if needed).      Do not wear jewelry, make-up or nail polish.  Do not wear lotions, powders, or perfumes, or deodorant.  Do not shave 48 hours prior to surgery.  Men may shave face and neck.  Do not bring valuables to the hospital.  [facility] is not responsible for any belongings or valuables.  Contacts, dentures or bridgework may not be worn into surgery.  Leave your suitcase in the car.  After surgery it may be brought to your room.  For patients admitted to the hospital, discharge time will be determined by your treatment team.  Patients discharged the day of surgery will not be allowed to drive home and must have someone with them for 24 hours.    Special instructions:   DO NOT smoke tobacco or vape for 24 hours before your procedure.  Please read over the following fact sheets that you were given. Anesthesia Post-op Instructions and Care and Recovery After Surgery      Colonoscopy, Adult, Care After This sheet gives you information about how to care for yourself after your procedure. Your health care provider may also give you more specific instructions. If you have problems or questions,  contact your health care provider. What can I expect after the procedure? After the procedure, it is common to have: A small amount of blood in your stool for 24 hours after the procedure. Some gas. Mild cramping or bloating of your abdomen. Follow these instructions at home: Eating and drinking  Drink enough fluid to keep your urine pale yellow. Follow instructions from your health care provider about eating or drinking restrictions. Resume your normal diet as instructed by your health care provider. Avoid heavy or fried foods that are hard to digest. Activity Rest as told by your health care provider. Avoid sitting for a long time without moving. Get up to take short walks every 1-2 hours. This is important to improve blood flow and breathing. Ask for help if you feel weak or unsteady. Return to your normal activities as told by your health care provider. Ask your health care provider what activities are safe for you. Managing cramping and bloating  Try walking around when you have cramps or feel bloated. Apply heat to your abdomen as told by your health care provider. Use the heat source that your health care provider recommends, such as a moist heat pack or a heating pad. Place a towel between your skin and the heat source. Leave the heat on for 20-30 minutes. Remove the heat if your skin turns  bright red. This is especially important if you are unable to feel pain, heat, or cold. You may have a greater risk of getting burned. General instructions If you were given a sedative during the procedure, it can affect you for several hours. Do not drive or operate machinery until your health care provider says that it is safe. For the first 24 hours after the procedure: Do not sign important documents. Do not drink alcohol. Do your regular daily activities at a slower pace than normal. Eat soft foods that are easy to digest. Take over-the-counter and prescription medicines only as told by  your health care provider. Keep all follow-up visits as told by your health care provider. This is important. Contact a health care provider if: You have blood in your stool 2-3 days after the procedure. Get help right away if you have: More than a small spotting of blood in your stool. Large blood clots in your stool. Swelling of your abdomen. Nausea or vomiting. A fever. Increasing pain in your abdomen that is not relieved with medicine. Summary After the procedure, it is common to have a small amount of blood in your stool. You may also have mild cramping and bloating of your abdomen. If you were given a sedative during the procedure, it can affect you for several hours. Do not drive or operate machinery until your health care provider says that it is safe. Get help right away if you have a lot of blood in your stool, nausea or vomiting, a fever, or increased pain in your abdomen. This information is not intended to replace advice given to you by your health care provider. Make sure you discuss any questions you have with your health care provider. Document Revised: 12/16/2018 Document Reviewed: 09/05/2018 Elsevier Patient Education  2022 [employer] Monitored Anesthesia Care, Care After This sheet gives you information about how to care for yourself after your procedure. Your health care provider may also give you more specific instructions. If you have problems or questions, contact your health care provider. What can I expect after the procedure? After the procedure, it is common to have: Tiredness. Forgetfulness about what happened after the procedure. Impaired judgment for important decisions. Nausea or vomiting. Some difficulty with balance. Follow these instructions at home: For the time period you were told by your health care provider:   Rest as needed. Do not participate in activities where you could fall or become injured. Do not drive or use machinery. Do not drink  alcohol. Do not take sleeping pills or medicines that cause drowsiness. Do not make important decisions or sign legal documents. Do not take care of children on your own. Eating and drinking Follow the diet that is recommended by your health care provider. Drink enough fluid to keep your urine pale yellow. If you vomit: Drink water, juice, or soup when you can drink without vomiting. Make sure you have little or no nausea before eating solid foods. General instructions Have a responsible adult stay with you for the time you are told. It is important to have someone help care for you until you are awake and alert. Take over-the-counter and prescription medicines only as told by your health care provider. If you have sleep apnea, surgery and certain medicines can increase your risk for breathing problems. Follow instructions from your health care provider about wearing your sleep device: Anytime you are sleeping, including during daytime naps. While taking prescription pain medicines, sleeping medicines, or medicines that make you  drowsy. Avoid smoking. Keep all follow-up visits as told by your health care provider. This is important. Contact a health care provider if: You keep feeling nauseous or you keep vomiting. You feel light-headed. You are still sleepy or having trouble with balance after 24 hours. You develop a rash. You have a fever. You have redness or swelling around the IV site. Get help right away if: You have trouble breathing. You have new-onset confusion at home. Summary For several hours after your procedure, you may feel tired. You may also be forgetful and have poor judgment. Have a responsible adult stay with you for the time you are told. It is important to have someone help care for you until you are awake and alert. Rest as told. Do not drive or operate machinery. Do not drink alcohol or take sleeping pills. Get help right away if you have trouble breathing, or if  you suddenly become confused. This information is not intended to replace advice given to you by your health care provider. Make sure you discuss any questions you have with your health care provider. Document Revised: 10/26/2019 Document Reviewed: 01/12/2019 Elsevier Patient Education  2022 [employer].

## 2021-01-02 ENCOUNTER — Telehealth: Payer: Self-pay | Admitting: Nurse Practitioner

## 2021-01-02 ENCOUNTER — Ambulatory Visit (INDEPENDENT_AMBULATORY_CARE_PROVIDER_SITE_OTHER): Payer: Medicare HMO | Admitting: Internal Medicine

## 2021-01-02 ENCOUNTER — Other Ambulatory Visit: Payer: Self-pay

## 2021-01-02 ENCOUNTER — Other Ambulatory Visit: Payer: Self-pay | Admitting: Internal Medicine

## 2021-01-02 ENCOUNTER — Other Ambulatory Visit (HOSPITAL_COMMUNITY): Payer: Medicare HMO

## 2021-01-02 ENCOUNTER — Encounter: Payer: Self-pay | Admitting: Internal Medicine

## 2021-01-02 ENCOUNTER — Telehealth: Payer: Self-pay | Admitting: Internal Medicine

## 2021-01-02 DIAGNOSIS — R112 Nausea with vomiting, unspecified: Secondary | ICD-10-CM

## 2021-01-02 DIAGNOSIS — K2950 Unspecified chronic gastritis without bleeding: Secondary | ICD-10-CM

## 2021-01-02 MED ORDER — OMEPRAZOLE 40 MG PO CPDR: 40.0000 mg | DELAYED_RELEASE_CAPSULE | Freq: Every day | ORAL | 3 refills | Status: DC

## 2021-01-02 MED ORDER — METOCLOPRAMIDE HCL 5 MG PO TABS: 5.0000 mg | ORAL_TABLET | Freq: Three times a day (TID) | ORAL | 0 refills | Status: DC | PRN

## 2021-01-02 NOTE — Patient Instructions (Signed)
Please take Omeprazole regularly for possible gastritis/GERD.  Please take Metoclopramide as needed for nausea and vomiting.  Please contact your GI specialist for your current symptoms.

## 2021-01-02 NOTE — Telephone Encounter (Signed)
Pt called need meds sent in to pharm, pt states he is getting sick again and needs meds

## 2021-01-02 NOTE — Telephone Encounter (Signed)
Pt aware.

## 2021-01-02 NOTE — Telephone Encounter (Signed)
According to chart, he rescheduled few weeks back for "stomach bug, vomiting/diarrhea". Saw PCP yesterday for two weeks of symptoms. He had mild fever at onset of symptoms. Given azithromycin 12/20/20 for suspected gastroenteritis by PCP.   He should probably reschedule. Sounds like he would be a difficult prep right now. If his symptoms persists, he should have ov here.

## 2021-01-02 NOTE — Telephone Encounter (Signed)
Called pt, informed him of Leslie's recommendation. TCS for 01/06/21 rescheduled to 02/03/21 at 7:30am. Advised pt if he doesn't get better to notify office to be seen-verbalized understanding. Endo scheduler informed.

## 2021-01-02 NOTE — Telephone Encounter (Signed)
Noted  

## 2021-01-02 NOTE — Progress Notes (Signed)
Virtual Visit via Telephone Note   This visit type was conducted due to national recommendations for restrictions regarding the COVID-19 Pandemic (e.g. social distancing) in an effort to limit this patient's exposure and mitigate transmission in our community.  Due to his co-morbid illnesses, this patient is at least at moderate risk for complications without adequate follow up.  This format is felt to be most appropriate for this patient at this time.  The patient did not have access to video technology/had technical difficulties with video requiring transitioning to audio format only (telephone).  All issues noted in this document were discussed and addressed.  No physical exam could be performed with this format.  Evaluation Performed:  Follow-up visit  Date:  01/02/2021   ID:  Thomas Rollins, DOB [birth year], MRN [id number]  Patient Location: Home Provider Location: Office/Clinic  Participants: Patient Location of Patient: Home Location of Provider: Telehealth Consent was obtain for visit to be over via telehealth. I verified that I am speaking with the correct person using two identifiers.  PCP:  Noreene Larsson, NP   Chief Complaint: Nausea and vomiting  History of Present Illness:    Thomas Rollins is a 60 y.o. male who has a televisit for complaint of nausea and vomiting for last 2 weeks.  He was recently given azithromycin for possible bacterial gastroenteritis, but he states that he had worsening of vomiting and diarrhea with it.  He denies any fever, chills, hematemesis.  Denies any melena or hematochezia.  He has not contacted his gastroenterologist yet.  The patient does not have symptoms concerning for COVID-19 infection (fever, chills, cough, or new shortness of breath).   Past Medical, Surgical, Social History, Allergies, and Medications have been Reviewed.  Past Medical History:  Diagnosis Date   Abnormal LFTs    Anxiety    panic attacks   Chronic  pain    Depression    Diabetes mellitus without complication ([place])    Encounter for hepatitis C screening test for low risk patient 04/05/2019   Hyperlipidemia    Hypertension    Past Surgical History:  Procedure Laterality Date   ankle sx     ARTHROSCOPIC REPAIR ACL     CARPAL TUNNEL RELEASE     HERNIA REPAIR     SHOULDER ARTHROSCOPY       Current Meds  Medication Sig   albuterol (VENTOLIN HFA) 108 (90 Base) MCG/ACT inhaler Inhale 2 puffs into the lungs every 6 (six) hours as needed for wheezing or shortness of breath.   ALPHAGAN P 0.1 % SOLN Place 1 drop into both eyes 2 (two) times daily as needed (irritation).   ALPRAZolam (XANAX) 0.5 MG tablet Take 1 tablet (0.5 mg total) by mouth 2 (two) times daily as needed for anxiety.   AMBULATORY NON FORMULARY MEDICATION 1 vial by Intracavernosal route as needed. Medication Name:  Prostaglandin E1, 10 mcg vials.  Dispense 5 with 11 refills  Inject 1 vial as needed   blood glucose meter kit and supplies Dispense based on patient and insurance preference. Use up to four times daily as directed. (FOR ICD-10 E10.9, E11.9).   Continuous Blood Gluc Sensor (DEXCOM G6 SENSOR) MISC Change sensor every 10 days   cyclobenzaprine (FLEXERIL) 10 MG tablet Take 10 mg by mouth 3 (three) times daily as needed for muscle spasms.   Diclofenac Sodium 3 % GEL Apply 1 g topically 4 (four) times daily as needed (pain).   Dulaglutide (TRULICITY) 1.5  MG/0.5ML SOPN Inject 1.5 mg into the skin once a week.   DULoxetine (CYMBALTA) 60 MG capsule TAKE 1 CAPSULE BY MOUTH EVERY DAY   gabapentin (NEURONTIN) 300 MG capsule TAKE ONE CAPSULE BY MOUTH TWICE DAILY (Patient taking differently: Take 300 mg by mouth daily.)   GLOBAL EASE INJECT PEN NEEDLES 31G X 8 MM MISC 1 each by Other route as directed.   ibuprofen (ADVIL) 200 MG tablet Take 400 mg by mouth every 6 (six) hours as needed for moderate pain or headache.   insulin detemir (LEVEMIR) 100 UNIT/ML FlexPen Inject 35  Units into the skin 2 (two) times daily. (Patient taking differently: Inject 45 Units into the skin 2 (two) times daily.)   levothyroxine (SYNTHROID) 137 MCG tablet Take 1 tablet (137 mcg total) by mouth every morning. on an empty stomach   lisinopril (ZESTRIL) 10 MG tablet TAKE 1 TABLET BY MOUTH DAILY   meloxicam (MOBIC) 7.5 MG tablet TAKE 1 TABLET BY MOUTH DAILY   metFORMIN (GLUCOPHAGE) 1000 MG tablet TAKE 1 TABLET BY MOUTH TWICE DAILY FOR 30 DAYS   morphine (MS CONTIN) 30 MG 12 hr tablet Take 1 tablet by mouth 2 (two) times daily. Take with the 36m    morphine (MS CONTIN) 60 MG 12 hr tablet Take 1 tablet by mouth 2 (two) times daily. Takes with the 322m   naloxone (NARCAN) nasal spray 4 mg/0.1 mL Place 1 spray into the nose as needed (opioid overdose).   Omega-3 Fatty Acids (FISH OIL) 1000 MG CAPS Take 2 capsules (2,000 mg total) by mouth in the morning and at bedtime. (Patient taking differently: Take 1 capsule by mouth every morning.)   ONETOUCH ULTRA test strip USE UP TO FOUR TIMES DAILY AS DIRECTED (25 DAY supply)   simvastatin (ZOCOR) 40 MG tablet TAKE 1 TABLET BY MOUTH EVERY EVENING   Sodium Sulfate-Mag Sulfate-KCl (SUTAB) 14575-351-0912G TABS Take as directed.   traZODone (DESYREL) 50 MG tablet Take 0.5-1 tablets (25-50 mg total) by mouth at bedtime as needed for sleep. (Patient taking differently: Take 25 mg by mouth at bedtime.)     Allergies:   Codeine, Other, and Penicillins   ROS:   Please see the history of present illness.     All other systems reviewed and are negative.   Labs/Other Tests and Data Reviewed:    Recent Labs: 12/03/2020: ALT 43; BUN 23; Creatinine, Ser 0.89; Hemoglobin 15.5; Platelets 206; Potassium 4.7; Sodium 134; TSH 7.980   Recent Lipid Panel Lab Results  Component Value Date/Time   CHOL 138 12/03/2020 10:26 AM   TRIG 193 (H) 12/03/2020 10:26 AM   HDL 36 (L) 12/03/2020 10:26 AM   CHOLHDL 3.7 02/05/2017 11:01 AM   LDLCALC 70 12/03/2020 10:26 AM    LDLCALC 87 02/05/2017 11:01 AM    Wt Readings from Last 3 Encounters:  11/22/20 245 lb 3.2 oz (111.2 kg)  10/25/20 238 lb (108 kg)  10/17/20 243 lb (110.2 kg)    ASSESSMENT & PLAN:    Nausea and vomiting Unclear etiology currently Could be medication induced as he is on chronic opiate medicines versus gastritis Started Reglan, considering could be related to diabetic gastroparesis  Gastritis/GERD Started omeprazole Advised to contact GI for EGD  Time:   Today, I have spent 13 minutes reviewing the chart, including problem list, medications, and with the patient with telehealth technology discussing the above problems.   Medication Adjustments/Labs and Tests Ordered: Current medicines are reviewed at length with the  patient today.  Concerns regarding medicines are outlined above.   Tests Ordered: No orders of the defined types were placed in this encounter.   Medication Changes: No orders of the defined types were placed in this encounter.    Note: This dictation was prepared with Dragon dictation along with smaller phrase technology. Similar sounding words can be transcribed inadequately or may not be corrected upon review. Any transcriptional errors that result from this process are unintentional.      Disposition:  Follow up  Signed, Lindell Spar, MD  01/02/2021 8:14 AM     [facility]

## 2021-01-02 NOTE — Telephone Encounter (Signed)
Pt called stating that he is having flu like symptoms and was put on an antibiotic and nausea meds. Pt is scheduled to have a colonoscopy with Dr. Marletta Lor on 01/06/21. Pt is wanting to know if he needs to reschedule his procedure. Please advise.

## 2021-01-02 NOTE — Telephone Encounter (Signed)
Pre-op appt 01/31/21 at 9:00am. Appt letter mailed with new procedure instructions.

## 2021-01-02 NOTE — Telephone Encounter (Signed)
PATIENT CALLED SAYING HE WAS SICK ON HIS STOMACH FROM AN ANTIBIOTIC WE PUT IM ON AND HE IS WONDERING IF HE SHOULD GO THROUGH WITH HIS PROCEDURE

## 2021-01-02 NOTE — Telephone Encounter (Signed)
Medications were sent to [facility] drug this am from visit please let him know

## 2021-01-02 NOTE — Telephone Encounter (Signed)
Routing to Tammy to get more info for [employer] PA.

## 2021-01-03 ENCOUNTER — Other Ambulatory Visit: Payer: Self-pay | Admitting: Nurse Practitioner

## 2021-01-03 ENCOUNTER — Encounter (HOSPITAL_COMMUNITY)
Admission: RE | Admit: 2021-01-03 | Discharge: 2021-01-03 | Disposition: A | Discharge status: Home / Self Care | Payer: Medicare HMO | Source: Ambulatory Visit | Attending: Internal Medicine | Admitting: Internal Medicine

## 2021-01-05 DIAGNOSIS — E119 Type 2 diabetes mellitus without complications: Secondary | ICD-10-CM | POA: Diagnosis not present

## 2021-01-14 ENCOUNTER — Ambulatory Visit (INDEPENDENT_AMBULATORY_CARE_PROVIDER_SITE_OTHER): Payer: Medicare HMO | Admitting: Pharmacist

## 2021-01-14 ENCOUNTER — Other Ambulatory Visit: Payer: Self-pay

## 2021-01-14 DIAGNOSIS — I10 Essential (primary) hypertension: Secondary | ICD-10-CM

## 2021-01-14 DIAGNOSIS — E785 Hyperlipidemia, unspecified: Secondary | ICD-10-CM

## 2021-01-14 DIAGNOSIS — E119 Type 2 diabetes mellitus without complications: Secondary | ICD-10-CM

## 2021-01-14 DIAGNOSIS — E1169 Type 2 diabetes mellitus with other specified complication: Secondary | ICD-10-CM

## 2021-01-14 MED ORDER — TRESIBA FLEXTOUCH 200 UNIT/ML ~~LOC~~ SOPN
100.0000 [IU] | PEN_INJECTOR | Freq: Every day | SUBCUTANEOUS | 2 refills | Status: DC
Start: 2021-01-14 — End: 2021-02-11

## 2021-01-14 NOTE — Chronic Care Management (AMB) (Signed)
Chronic Care Management Pharmacy Note  01/14/2021 Name:  Thomas Rollins MRN:  [id number] DOB:  05-29-1960  Summary:  Type 2 Diabetes: Current medications: metformin 1,000 mg by mouth twice daily with meals, dulaglutide (Trulicity) 1.5 mg subcutaneously weekly, and insulin detemir (Levemir) 45 units subcutaneously twice daily Current glucose readings: see CGM report below. Some improvement in blood glucose but blood glucose remains consistently high throughout the day for the most part.  Continue metformin 1,000 mg by mouth twice daily with meals, dulaglutide (Trulicity) 1.5 mg subcutaneously weekly on Tuesday Discussed with Dr. Posey Pronto, and will increase insulin detemir (Levemir) to 50 units subcutaneously twice daily until he runs out of Levemir then will switch to [place] Flextouch (200 units/mL) 100 units subcutaneously once daily. Toujeo (300 units/mL) not covered on patient's formulary.      Subjective: Thomas Rollins is an 60 y.o. year old male who is a primary patient of Noreene Larsson, NP.  The CCM team was consulted for assistance with disease management and care coordination needs.    Engaged with patient face to face for follow up visit in response to provider referral for pharmacy case management and/or care coordination services.   Consent to Services:  The patient was given information about Chronic Care Management services, agreed to services, and gave verbal consent prior to initiation of services.  Please see initial visit note for detailed documentation.   Patient Care Team: Noreene Larsson, NP as PCP - General (Nurse Practitioner) Caren Macadam, MD as PCP - Family Medicine (Family Medicine) Eloise Harman, DO as Consulting Physician (Gastroenterology) Beryle Lathe, [facility] (Pharmacist)  Objective:  Lab Results  Component Value Date   CREATININE 0.89 12/03/2020   CREATININE 0.84 08/02/2020   CREATININE 0.99 04/29/2020    Lab Results  Component Value  Date   HGBA1C 9.7 (H) 12/03/2020   Last diabetic Eye exam: No results found for: HMDIABEYEEXA  Last diabetic Foot exam: No results found for: HMDIABFOOTEX      Component Value Date/Time   CHOL 138 12/03/2020 1026   TRIG 193 (H) 12/03/2020 1026   HDL 36 (L) 12/03/2020 1026   CHOLHDL 3.7 02/05/2017 1101   LDLCALC 70 12/03/2020 1026   LDLCALC 87 02/05/2017 1101    Hepatic Function Latest Ref Rng & Units 12/03/2020 12/03/2020 08/02/2020  Total Protein 6.0 - 8.5 g/dL 6.8 6.6 7.3  Albumin 3.8 - 4.8 g/dL 4.6 4.7 5.1(H)  AST 0 - 40 IU/L 28 28 46(H)  ALT 0 - 44 IU/L 43 44 72(H)  Alk Phosphatase 44 - 121 IU/L 83 79 66  Total Bilirubin 0.0 - 1.2 mg/dL 0.5 0.4 0.6  Bilirubin, Direct 0.00 - 0.40 mg/dL - 0.13 -    Lab Results  Component Value Date/Time   TSH 7.980 (H) 12/03/2020 10:26 AM   TSH 2.190 08/02/2020 10:56 AM   FREET4 0.92 12/03/2020 10:26 AM   FREET4 1.35 08/02/2020 10:56 AM    CBC Latest Ref Rng & Units 12/03/2020 08/02/2020 04/29/2020  WBC 3.4 - 10.8 x10E3/uL 7.1 7.5 6.1  Hemoglobin 13.0 - 17.7 g/dL 15.5 15.4 14.9  Hematocrit 37.5 - 51.0 % 46.9 46.5 45.4  Platelets 150 - 450 x10E3/uL 206 206 184    Lab Results  Component Value Date/Time   VD25OH 33.1 01/31/2020 11:28 AM    Clinical ASCVD: No  The 10-year ASCVD risk score (Arnett DK, et al., 2019) is: 18.8%   Values used to calculate the score:  Age: 60 years     Sex: Male     Is Non-Hispanic African American: No     Diabetic: Yes     Tobacco smoker: No     Systolic Blood Pressure: 696 mmHg     Is BP treated: Yes     HDL Cholesterol: 36 mg/dL     Total Cholesterol: 138 mg/dL    Social History   Tobacco Use  Smoking Status Former   Types: Cigarettes  Smokeless Tobacco Never   BP Readings from Last 3 Encounters:  11/22/20 124/75  10/25/20 126/80  10/17/20 126/76   Pulse Readings from Last 3 Encounters:  11/22/20 75  10/25/20 80  10/17/20 84   Wt Readings from Last 3 Encounters:  11/22/20 245 lb  3.2 oz (111.2 kg)  10/25/20 238 lb (108 kg)  10/17/20 243 lb (110.2 kg)    Assessment: Review of patient past medical history, allergies, medications, health status, including review of consultants reports, laboratory and other test data, was performed as part of comprehensive evaluation and provision of chronic care management services.   SDOH:  (Social Determinants of Health) assessments and interventions performed:    CCM Care Plan  Allergies  Allergen Reactions   Codeine Other (See Comments)    Slurred Speech    Other     [place] - N/V   sweat potatoes - N/V    Penicillins Rash    Medications Reviewed Today     Reviewed by Beryle Lathe, [facility] (Pharmacist) on 01/14/21 at 1140  Med List Status: <None>   Medication Order Taking? Sig Documenting Provider Last Dose Status Informant  albuterol (VENTOLIN HFA) 108 (90 Base) MCG/ACT inhaler [id number] Yes Inhale 2 puffs into the lungs every 6 (six) hours as needed for wheezing or shortness of breath. Noreene Larsson, NP Taking Active Self  ALPHAGAN P 0.1 % SOLN [id number] Yes Place 1 drop into both eyes 2 (two) times daily as needed (irritation). [provider] Taking Active Self           Med Note Jilda Roche A   Tue Dec 17, 2020  2:48 PM)    ALPRAZolam Duanne Moron) 0.5 MG tablet [id number] Yes Take 1 tablet (0.5 mg total) by mouth 2 (two) times daily as needed for anxiety. Noreene Larsson, NP Taking Active Self  AMBULATORY NON FORMULARY MEDICATION [id number] No 1 vial by Intracavernosal route as needed. Medication Name:  Prostaglandin E1, 10 mcg vials.  Dispense 5 with 11 refills  Inject 1 vial as needed Franchot Gallo, MD Unknown Active Self  blood glucose meter kit and supplies [id number]  Dispense based on patient and insurance preference. Use up to four times daily as directed. (FOR ICD-10 E10.9, E11.9). Noreene Larsson, NP  Active Self  Continuous Blood Gluc Sensor (DEXCOM G6 SENSOR) MISC [id number] Yes Change sensor  every 10 days Noreene Larsson, NP Taking Active Self  cyclobenzaprine (FLEXERIL) 10 MG tablet [id number] No Take 10 mg by mouth 3 (three) times daily as needed for muscle spasms.  Patient not taking: Reported on 01/14/2021   [provider] Not Taking Active Self  Diclofenac Sodium 3 % GEL [id number] Yes Apply 1 g topically 4 (four) times daily as needed (pain). [provider] Taking Active Self  Dulaglutide (TRULICITY) 1.5 RK/2.7CW SOPN [id number] Yes Inject 1.5 mg into the skin once a week. Noreene Larsson, NP Taking Active Self           Med Note Caryn Section, [place]  A   Tue Dec 17, 2020  2:45 PM) Tuesdays  DULoxetine (CYMBALTA) 60 MG capsule [id number] Yes TAKE 1 CAPSULE BY MOUTH EVERY DAY Lindell Spar, MD Taking Active Self  gabapentin (NEURONTIN) 300 MG capsule [id number] Yes TAKE ONE CAPSULE BY MOUTH TWICE DAILY  Patient taking differently: Take 300 mg by mouth daily.   Noreene Larsson, NP Taking Active   GLOBAL EASE INJECT PEN NEEDLES 31G X 8 MM MISC [id number]  1 each by Other route as directed. [provider]  Active Self  ibuprofen (ADVIL) 200 MG tablet [id number] Yes Take 400 mg by mouth every 6 (six) hours as needed for moderate pain or headache. [provider] Taking Active Self  insulin degludec (TRESIBA FLEXTOUCH) 200 UNIT/ML FlexTouch Pen [id number] Yes Inject 100 Units into the skin daily. Lindell Spar, MD  Active   levothyroxine (SYNTHROID) 137 MCG tablet [id number] Yes Take 1 tablet (137 mcg total) by mouth every morning. on an empty stomach Lindell Spar, MD Taking Active   lisinopril (ZESTRIL) 10 MG tablet [id number] Yes TAKE 1 TABLET BY MOUTH DAILY Noreene Larsson, NP Taking Active Self  meloxicam (MOBIC) 7.5 MG tablet [id number] Yes TAKE 1 TABLET BY MOUTH DAILY Noreene Larsson, NP Taking Active Self  metFORMIN (GLUCOPHAGE) 1000 MG tablet [id number] Yes TAKE 1 TABLET BY MOUTH TWICE DAILY Noreene Larsson, NP Taking Active   metoCLOPramide (REGLAN) 5 MG  tablet [id number] Yes Take 1 tablet (5 mg total) by mouth every 8 (eight) hours as needed for nausea or vomiting. Lindell Spar, MD Taking Active   morphine (MS CONTIN) 30 MG 12 hr tablet [id number] Yes Take 1 tablet by mouth 2 (two) times daily. Take with the $Remove'60mg'TptZHnF$   [provider] Taking Active Self  morphine (MS CONTIN) 60 MG 12 hr tablet [id number]  Take 1 tablet by mouth 2 (two) times daily. Takes with the $Remove'30mg'yLuRXBZ$   [provider]  Active Self  naloxone [facility]) nasal spray 4 mg/0.1 mL [id number] No Place 1 spray into the nose as needed (opioid overdose). [provider] Unknown Active Self  Omega-3 Fatty Acids (FISH OIL) 1000 MG CAPS [id number] Yes Take 2 capsules (2,000 mg total) by mouth in the morning and at bedtime.  Patient taking differently: Take 1 capsule by mouth every morning.   Noreene Larsson, NP Taking Active   omeprazole (PRILOSEC) 40 MG capsule [id number] Yes Take 1 capsule (40 mg total) by mouth daily. Lindell Spar, MD Taking Active   [facility] ULTRA test strip [id number]  USE UP TO FOUR TIMES DAILY AS DIRECTED (25 DAY supply) Noreene Larsson, NP  Active Self  simvastatin (ZOCOR) 40 MG tablet [id number] Yes TAKE 1 TABLET BY MOUTH EVERY EVENING Noreene Larsson, NP Taking Active   Sodium Sulfate-Mag Sulfate-KCl (SUTAB) [phone] MG TABS [id number] Yes Take as directed. Eloise Harman, DO Taking Active Self  traZODone (DESYREL) 50 MG tablet [id number] Yes Take 0.5-1 tablets (25-50 mg total) by mouth at bedtime as needed for sleep.  Patient taking differently: Take 25 mg by mouth at bedtime.   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 03/27/2020   Sleep apnea 01/31/2020   CPAP (continuous positive airway pressure) dependence 10/23/2019  Opioid contract exists 10/23/2019    Edentulous 10/23/2019   Insomnia, unspecified 10/23/2019   Body mass index (BMI) 37.0-37.9, adult 09/12/2019   Elevated blood-pressure reading, without diagnosis of hypertension 09/12/2019   Anxiety 04/12/2019   Panic attacks 04/12/2019   Vitamin D deficiency 04/11/2019   Hypothyroidism 04/11/2019   Diabetes mellitus type 2, insulin dependent ([place]) 02/05/2017   Depression, recurrent ([place]) 02/05/2017   Hyperlipidemia associated with type 2 diabetes mellitus ([place]) 02/05/2017   Essential hypertension 02/05/2017    Immunization History  Administered Date(s) Administered   Influenza,inj,quad, With Preservative 12/16/2018   Moderna SARS-COV2 Booster Vaccination 04/30/2020   Moderna Sars-Covid-2 Vaccination 09/20/2019, 10/18/2019    Conditions to be addressed/monitored: HTN, HLD, and DMII  Care Plan : Medication Management  Updates made by Beryle Lathe, [place] since 01/14/2021 12:00 AM     Problem: T2DM, HTN, HLD   Priority: High  Onset Date: 10/22/2020     Long-Range Goal: Disease Progression Prevention   Start Date: 10/22/2020  Expected End Date: 01/20/2021  Recent Progress: On track  Priority: High  Note:   Current Barriers:  Unable to achieve control of diabetes  Pharmacist Clinical Goal(s):  Over the next 90 days, patient will Achieve control of diabetes as evidenced by improved A1c through collaboration with PharmD and provider.   Interventions: 1:1 collaboration with Noreene Larsson, NP regarding development and update of comprehensive plan of care as evidenced by provider attestation and co-signature Inter-disciplinary care team collaboration (see longitudinal plan of care) Comprehensive medication review performed; medication list updated in electronic medical record  Type 2 Diabetes: Current medications: metformin 1,000 mg by mouth twice daily with meals, dulaglutide (Trulicity) 1.5 mg subcutaneously weekly, and insulin detemir (Levemir) 45 units  subcutaneously twice daily Intolerances: none Taking medications as directed: yes Side effects thought to be attributed to current medication regimen: no Denies hypoglycemic/hyperglycemic symptoms; no hypoglycemia on CGM report.  Hypoglycemia prevention: not indicated at this time Current meal patterns: breakfast: eggs, sausage, and half slice of toast w/o butter ; lunch:  chilli dog ; dinner: Fried chicken, rice, pasta, vegetables, and friend shrimp ; snacks: cookies, fruit, sandwich, and cheese puffs ; drinks: water, regular soda, juice, and milk Current exercise:  yard work On a statin: $RemoveB'[x]'LbsZlhWJ$  Yes  $Re'[]'bKk$  No    Last microalbumin: 16.1 (04/29/20); on an ACEi/ARB: $RemoveBefo'[x]'Dnrxbhlymlr$  Yes  $Re'[]'NXs$  No    Last eye exam: "about a year ago" per patient. Patient plans to make appointment soon. Last foot exam: unknown Pneumonia vaccine: unknown Current glucose readings: see CGM report below. Some improvement in blood glucose but blood glucose remains consistently high throughout the day for the most part.  Uncontrolled; Most recent A1c above goal of <7% per ADA guidelines Introduction to diabetes basic facts Medication: Identify diabetes medication and when best taken Monitoring: target blood sugar range, when to test Signs and symptoms of hyperglycemia Hypoglycemia: I have discussed with the patient how to treat hypoglycemia by the rule of 15; eat/drink 15g of sugar in the form of glucose tabs, 4 ounces of juice or soda and recheck fingerstick glucose in 15 minutes. Chocolate bars and ice cream should be avoided because fat delays carbohydrate digestion and absorption. Retreat if glucose remains low. Driving should cease until glucose is normal. Continue metformin 1,000 mg by mouth twice daily with meals, dulaglutide (Trulicity) 1.5 mg subcutaneously weekly on Tuesday Discussed with Dr. Posey Pronto, and will increase insulin detemir (Levemir) to 50 units subcutaneously twice daily until he runs out  of Levemir then will switch to  [place] Flextouch (200 units/mL) 100 units subcutaneously once daily. Toujeo (300 units/mL) not covered on patient's formulary.  Continue to monitor blood glucose with Dexcom G6. Low blood glucose alarm is set for <70 and high blood glucose alarm is set for >300. I would like to eventually decrease high blood glucose alarm to 250 once patient is able to improve blood glucose. Patient was instructed to use finger stick blood glucose checks as needed. Will follow-up with blood glucose via Dexcom and recommend adjustments to medications as necessary.  Patient identified as a good candidate for SGLT-2 inhibitor given reduction in cardiovascular disease, slowed chronic kidney disease progression, low risk of hypoglycemia, and weight loss. Patient denies a history of significant genitourinary infections. No concern for hypotension/volume depletion. GFR at least >20 mL/minute/1.73 m2. Can consider discussing further at future visit.    Hypertension: Current medications: lisinopril 10 mg by mouth once daily Intolerances: none Taking medications as directed: yes Side effects thought to be attributed to current medication regimen: no Denies dizziness, lightheadedness, blurred vision, and headache Home blood pressure readings: not discussed today Blood pressure under good control. Blood pressure is at goal of <130/80 mmHg per 2017 AHA/ACC guidelines. Continue lisinopril 10 mg by mouth once daily Encourage dietary sodium restriction/DASH diet Recommend regular aerobic exercise  Hyperlipidemia: Current medications: simvastatin 40 mg by mouth at bedtime and fish oil 1 capsule by mouth every morning  Intolerances: none Taking medications as directed: yes Side effects thought to be attributed to current medication regimen: no Controlled; LDL at goal of <100 due to high risk given at least 2 major CV risk factors (diabetes and hypertension) and 10-year risk 10-20% per 2020 AACE/ACE guidelines and TG above goal  of <150 per 2020 AACE/ACE guidelines Continue simvastatin 40 mg by mouth at bedtime Discussed need for and importance of continued work on weight loss Consider increasing fish oil to 2 capsules by mouth daily to slightly lower triglycerides  Patient Goals/Self-Care Activities Over the next 90 days, patient will:  Take medications as prescribed Check blood sugar continuously with continuous glucose monitor, document, and provide at future appointments Engage in dietary modifications by fewer sweetened foods & beverages and watch portion sizes/amount of food eaten at one time  Follow Up Plan: Face to Face appointment with care management team member scheduled for: 02/11/21 PCP appointment in 2 weeks      Medication Assistance: None required.  Patient affirms current coverage meets needs.  Patient's preferred pharmacy is:  [place], [place] [address] [place] [address] Phone: [phone] Fax: [phone]  [facility] [address], [place] [address] Phone: [phone] Fax: [phone]  Follow Up:  Patient agrees to Care Plan and Follow-up.  Plan: Face to Face appointment with care management team member scheduled for: 02/11/21 Next PCP appointment scheduled for: 2 weeks  Kennon Holter, PharmD, [facility] Clinical Pharmacist [facility] [phone]

## 2021-01-14 NOTE — Patient Instructions (Signed)
GALEN RUSSMAN,  It was great to talk to you today!  Increase Levemir to 50 units subcutaneously twice daily until you run out then we will change your insulin to a longer acting insulin that you only have to take once daily. I will send this to your pharmacy with instructions of how many units to take.   Please call me with any questions or concerns.   Visit Information  Following are the goals we discussed today:  Patient Goals/Self-Care Activities Over the next 90 days, patient will:  Take medications as prescribed Check blood sugar continuously with continuous glucose monitor, document, and provide at future appointments Engage in dietary modifications by fewer sweetened foods & beverages and watch portion sizes/amount of food eaten at one time  Plan: Face to Face appointment with care management team member scheduled for: 02/11/21  Domenic Moras, PharmD, [facility] Clinical Pharmacist [place] [facility] [phone]   Please call the care guide team at [phone] if you need to cancel or reschedule your appointment.   Print copy of patient instructions, educational materials, and care plan provided in person.

## 2021-01-21 ENCOUNTER — Other Ambulatory Visit: Payer: Self-pay | Admitting: Nurse Practitioner

## 2021-01-21 DIAGNOSIS — G4700 Insomnia, unspecified: Secondary | ICD-10-CM

## 2021-01-22 DIAGNOSIS — E119 Type 2 diabetes mellitus without complications: Secondary | ICD-10-CM

## 2021-01-22 DIAGNOSIS — E785 Hyperlipidemia, unspecified: Secondary | ICD-10-CM | POA: Diagnosis not present

## 2021-01-22 DIAGNOSIS — E1169 Type 2 diabetes mellitus with other specified complication: Secondary | ICD-10-CM | POA: Diagnosis not present

## 2021-01-22 DIAGNOSIS — I10 Essential (primary) hypertension: Secondary | ICD-10-CM

## 2021-01-22 DIAGNOSIS — Z794 Long term (current) use of insulin: Secondary | ICD-10-CM

## 2021-01-24 ENCOUNTER — Ambulatory Visit: Payer: Medicare HMO | Admitting: Nurse Practitioner

## 2021-01-24 NOTE — Progress Notes (Signed)
No show

## 2021-01-27 NOTE — Progress Notes (Incomplete)
H&P  Chief Complaint: ***  History of Present Illness: ***  Past Medical History:  Diagnosis Date   Abnormal LFTs    Anxiety    panic attacks   Chronic pain    Depression    Diabetes mellitus without complication ([place])    Encounter for hepatitis C screening test for low risk patient 04/05/2019   Hyperlipidemia    Hypertension     Past Surgical History:  Procedure Laterality Date   ankle sx     ARTHROSCOPIC REPAIR ACL     CARPAL TUNNEL RELEASE     HERNIA REPAIR     SHOULDER ARTHROSCOPY      Home Medications:  Allergies as of 01/28/2021       Reactions   Codeine Other (See Comments)   Slurred Speech    Other    [place] - N/V   sweat potatoes - N/V    Penicillins Rash        Medication List        Accurate as of January 27, 2021  8:12 PM. If you have any questions, ask your nurse or doctor.          albuterol 108 (90 Base) MCG/ACT inhaler Commonly known as: VENTOLIN HFA Inhale 2 puffs into the lungs every 6 (six) hours as needed for wheezing or shortness of breath.   Alphagan P 0.1 % Soln Generic drug: brimonidine Place 1 drop into both eyes 2 (two) times daily as needed (irritation).   ALPRAZolam 0.5 MG tablet Commonly known as: XANAX Take 1 tablet (0.5 mg total) by mouth 2 (two) times daily as needed for anxiety.   AMBULATORY NON FORMULARY MEDICATION 1 vial by Intracavernosal route as needed. Medication Name:  Prostaglandin E1, 10 mcg vials.  Dispense 5 with 11 refills  Inject 1 vial as needed   blood glucose meter kit and supplies Dispense based on patient and insurance preference. Use up to four times daily as directed. (FOR ICD-10 E10.9, E11.9).   cyclobenzaprine 10 MG tablet Commonly known as: FLEXERIL Take 10 mg by mouth 3 (three) times daily as needed for muscle spasms.   Dexcom G6 Sensor Misc Change sensor every 10 days   Diclofenac Sodium 3 % Gel Apply 1 g topically 4 (four) times daily as needed (pain).   DULoxetine 60 MG  capsule Commonly known as: CYMBALTA TAKE 1 CAPSULE BY MOUTH EVERY DAY   Fish Oil 1000 MG Caps Take 2 capsules (2,000 mg total) by mouth in the morning and at bedtime. What changed:  how much to take when to take this   gabapentin 300 MG capsule Commonly known as: NEURONTIN TAKE ONE CAPSULE BY MOUTH TWICE DAILY What changed: when to take this   Global Ease Inject Pen Needles 31G X 8 MM Misc Generic drug: Insulin Pen Needle 1 each by Other route as directed.   ibuprofen 200 MG tablet Commonly known as: ADVIL Take 400 mg by mouth every 6 (six) hours as needed for moderate pain or headache.   levothyroxine 137 MCG tablet Commonly known as: SYNTHROID Take 1 tablet (137 mcg total) by mouth every morning. on an empty stomach   lisinopril 10 MG tablet Commonly known as: ZESTRIL TAKE 1 TABLET BY MOUTH DAILY   meloxicam 7.5 MG tablet Commonly known as: MOBIC TAKE 1 TABLET BY MOUTH DAILY   metFORMIN 1000 MG tablet Commonly known as: GLUCOPHAGE TAKE 1 TABLET BY MOUTH TWICE DAILY   metoCLOPramide 5 MG tablet Commonly known as: Reglan  Take 1 tablet (5 mg total) by mouth every 8 (eight) hours as needed for nausea or vomiting.   morphine 30 MG 12 hr tablet Commonly known as: MS CONTIN Take 1 tablet by mouth 2 (two) times daily. Take with the $Remove'60mg'fWGJhLt$    morphine 60 MG 12 hr tablet Commonly known as: MS CONTIN Take 1 tablet by mouth 2 (two) times daily. Takes with the $Remove'30mg'fyFXhet$    naloxone 4 MG/0.1ML Liqd nasal spray kit Commonly known as: NARCAN Place 1 spray into the nose as needed (opioid overdose).   omeprazole 40 MG capsule Commonly known as: PRILOSEC Take 1 capsule (40 mg total) by mouth daily.   OneTouch Ultra test strip Generic drug: glucose blood USE UP TO FOUR TIMES DAILY AS DIRECTED (25 DAY supply)   simvastatin 40 MG tablet Commonly known as: ZOCOR TAKE 1 TABLET BY MOUTH EVERY EVENING   Sutab [phone] MG Tabs Generic drug: Sodium Sulfate-Mag Sulfate-KCl Take  as directed.   traZODone 50 MG tablet Commonly known as: DESYREL TAKE ONE-HALF TO 1 TABLET BY MOUTH AT BEDTIME AS NEEDED FOR SLEEP   Tresiba FlexTouch 200 UNIT/ML FlexTouch Pen Generic drug: insulin degludec Inject 100 Units into the skin daily.   Trulicity 1.5 UT/6.5YY Sopn Generic drug: Dulaglutide Inject 1.5 mg into the skin once a week.        Allergies:  Allergies  Allergen Reactions   Codeine Other (See Comments)    Slurred Speech    Other     [place] - N/V   sweat potatoes - N/V    Penicillins Rash    Family History  Problem Relation Age of Onset   Stomach cancer Mother 50   Heart Problems Father    Dementia Father    Cancer Father        "tumors"   Bone cancer Brother    Lung cancer Brother    Cancer Daughter 61       Male cancer   Cancer Daughter 26       Male   Colon cancer Neg Hx     Social History:  reports that he has quit smoking. His smoking use included cigarettes. He has never used smokeless tobacco. He reports that he does not currently use alcohol. He reports current drug use. Drug: Marijuana.  ROS: A complete review of systems was performed.  All systems are negative except for pertinent findings as noted.  Physical Exam:  Vital signs in last 24 hours: There were no vitals taken for this visit. Constitutional:  Alert and oriented, No acute distress Cardiovascular: Regular rate  Respiratory: Normal respiratory effort GI: Abdomen is soft, nontender, nondistended, no abdominal masses. No CVAT.  Genitourinary: Normal male phallus, testes are descended bilaterally and non-tender and without masses, scrotum is normal in appearance without lesions or masses, perineum is normal on inspection. Lymphatic: No lymphadenopathy Neurologic: Grossly intact, no focal deficits Psychiatric: Normal mood and affect  I have reviewed prior pt notes  I have reviewed notes from referring/previous physicians  I have reviewed urinalysis  results    Impression/Assessment:  ***  Plan:  ***

## 2021-01-28 ENCOUNTER — Ambulatory Visit: Payer: Medicare HMO | Admitting: Urology

## 2021-01-28 DIAGNOSIS — N5201 Erectile dysfunction due to arterial insufficiency: Secondary | ICD-10-CM

## 2021-01-29 DIAGNOSIS — E1140 Type 2 diabetes mellitus with diabetic neuropathy, unspecified: Secondary | ICD-10-CM | POA: Diagnosis not present

## 2021-01-29 DIAGNOSIS — E119 Type 2 diabetes mellitus without complications: Secondary | ICD-10-CM | POA: Diagnosis not present

## 2021-01-29 DIAGNOSIS — M4722 Other spondylosis with radiculopathy, cervical region: Secondary | ICD-10-CM | POA: Diagnosis not present

## 2021-01-29 DIAGNOSIS — G894 Chronic pain syndrome: Secondary | ICD-10-CM | POA: Diagnosis not present

## 2021-01-29 DIAGNOSIS — Z79899 Other long term (current) drug therapy: Secondary | ICD-10-CM | POA: Diagnosis not present

## 2021-01-29 NOTE — Patient Instructions (Signed)
Your procedure is scheduled on: 12/12.2022  Report to [facility] Main Entrance at    6:15 AM.  Call this number if you have problems the morning of surgery: [phone]   Remember:              Follow Directions on the letter you received from Your Physician's office regarding the Bowel Prep              No Smoking the day of Procedure :   Take these medicines the morning of surgery with A SIP OF WATER: Xanax, Cymbalta, Gabapentin,                      levothyroxine, Omeprazole, and morphine if needed              Use inhalers if needed             No diabetic medications am of procedure   Do not wear jewelry, make-up or nail polish.    Do not bring valuables to the hospital.  Contacts, dentures or bridgework may not be worn into surgery.  .   Patients discharged the day of surgery will not be allowed to drive home.     Colonoscopy, Adult, Care After This sheet gives you information about how to care for yourself after your procedure. Your health care provider may also give you more specific instructions. If you have problems or questions, contact your health care provider. What can I expect after the procedure? After the procedure, it is common to have: A small amount of blood in your stool for 24 hours after the procedure. Some gas. Mild abdominal cramping or bloating.  Follow these instructions at home: General instructions  For the first 24 hours after the procedure: Do not drive or use machinery. Do not sign important documents. Do not drink alcohol. Do your regular daily activities at a slower pace than normal. Eat soft, easy-to-digest foods. Rest often. Take over-the-counter or prescription medicines only as told by your health care provider. It is up to you to get the results of your procedure. Ask your health care provider, or the department performing the procedure, when your results will be ready. Relieving cramping and bloating Try walking around when  you have cramps or feel bloated. Apply heat to your abdomen as told by your health care provider. Use a heat source that your health care provider recommends, such as a moist heat pack or a heating pad. Place a towel between your skin and the heat source. Leave the heat on for 20-30 minutes. Remove the heat if your skin turns bright red. This is especially important if you are unable to feel pain, heat, or cold. You may have a greater risk of getting burned. Eating and drinking Drink enough fluid to keep your urine clear or pale yellow. Resume your normal diet as instructed by your health care provider. Avoid heavy or fried foods that are hard to digest. Avoid drinking alcohol for as long as instructed by your health care provider. Contact a health care provider if: You have blood in your stool 2-3 days after the procedure. Get help right away if: You have more than a small spotting of blood in your stool. You pass large blood clots in your stool. Your abdomen is swollen. You have nausea or vomiting. You have a fever. You have increasing abdominal pain that is not relieved with medicine. This information is not intended to replace advice  given to you by your health care provider. Make sure you discuss any questions you have with your health care provider. Document Released: 09/24/2003 Document Revised: 11/04/2015 Document Reviewed: 04/23/2015 Elsevier Interactive Patient Education  [employer].

## 2021-01-30 ENCOUNTER — Other Ambulatory Visit (HOSPITAL_COMMUNITY): Payer: Medicare HMO

## 2021-01-31 ENCOUNTER — Encounter (HOSPITAL_COMMUNITY): Payer: Self-pay

## 2021-01-31 ENCOUNTER — Encounter (HOSPITAL_COMMUNITY)
Admission: RE | Admit: 2021-01-31 | Discharge: 2021-01-31 | Disposition: A | Discharge status: Home / Self Care | Payer: Medicare HMO | Source: Ambulatory Visit | Attending: Internal Medicine | Admitting: Internal Medicine

## 2021-01-31 VITALS — BP 126/87 | HR 100 | Temp 97.8°F | Resp 18 | Ht 71.0 in | Wt 230.0 lb

## 2021-01-31 DIAGNOSIS — E119 Type 2 diabetes mellitus without complications: Secondary | ICD-10-CM | POA: Diagnosis not present

## 2021-01-31 DIAGNOSIS — Z01818 Encounter for other preprocedural examination: Secondary | ICD-10-CM | POA: Insufficient documentation

## 2021-01-31 DIAGNOSIS — Z794 Long term (current) use of insulin: Secondary | ICD-10-CM | POA: Insufficient documentation

## 2021-01-31 HISTORY — DX: Hypothyroidism, unspecified: E03.9

## 2021-01-31 HISTORY — DX: Sleep apnea, unspecified: G47.30

## 2021-01-31 LAB — BASIC METABOLIC PANEL
Anion gap: 8 (ref 5–15)
BUN: 25 mg/dL — ABNORMAL HIGH (ref 8–23)
CO2: 26 mmol/L (ref 22–32)
Calcium: 9.4 mg/dL (ref 8.9–10.3)
Chloride: 101 mmol/L (ref 98–111)
Creatinine, Ser: 0.77 mg/dL (ref 0.61–1.24)
GFR, Estimated: >60 mL/min (ref >60)
Glucose, Bld: 147 mg/dL — ABNORMAL HIGH (ref 70–99)
Potassium: 4.3 mmol/L (ref 3.5–5.1)
Sodium: 135 mmol/L (ref 135–145)

## 2021-02-03 ENCOUNTER — Ambulatory Visit (HOSPITAL_COMMUNITY)
Admission: RE | Admit: 2021-02-03 | Discharge: 2021-02-03 | Disposition: A | Discharge status: Home / Self Care | Payer: Medicare HMO | Attending: Internal Medicine | Admitting: Internal Medicine

## 2021-02-03 ENCOUNTER — Ambulatory Visit (HOSPITAL_COMMUNITY): Payer: Medicare HMO | Admitting: Anesthesiology

## 2021-02-03 ENCOUNTER — Encounter (HOSPITAL_COMMUNITY)
Admission: RE | Disposition: A | Discharge status: Home / Self Care | Payer: Self-pay | Source: Home / Self Care | Attending: Internal Medicine

## 2021-02-03 DIAGNOSIS — Z1211 Encounter for screening for malignant neoplasm of colon: Secondary | ICD-10-CM | POA: Diagnosis not present

## 2021-02-03 DIAGNOSIS — Z87891 Personal history of nicotine dependence: Secondary | ICD-10-CM | POA: Insufficient documentation

## 2021-02-03 DIAGNOSIS — E119 Type 2 diabetes mellitus without complications: Secondary | ICD-10-CM | POA: Insufficient documentation

## 2021-02-03 DIAGNOSIS — K648 Other hemorrhoids: Secondary | ICD-10-CM | POA: Diagnosis not present

## 2021-02-03 DIAGNOSIS — E039 Hypothyroidism, unspecified: Secondary | ICD-10-CM | POA: Diagnosis not present

## 2021-02-03 DIAGNOSIS — Z794 Long term (current) use of insulin: Secondary | ICD-10-CM | POA: Insufficient documentation

## 2021-02-03 HISTORY — PX: COLONOSCOPY WITH PROPOFOL: SHX5780

## 2021-02-03 SURGERY — COLONOSCOPY WITH PROPOFOL
Anesthesia: General

## 2021-02-03 MED ORDER — PROPOFOL 10 MG/ML IV BOLUS
INTRAVENOUS | Status: DC | PRN
  Administered 2021-02-03 (×2): 30 mg via INTRAVENOUS
  Administered 2021-02-03 (×2): 50 mg via INTRAVENOUS
  Administered 2021-02-03: 20 mg via INTRAVENOUS
  Administered 2021-02-03: 30 mg via INTRAVENOUS
  Administered 2021-02-03: 50 mg via INTRAVENOUS
  Administered 2021-02-03: 20 mg via INTRAVENOUS
  Administered 2021-02-03 (×2): 30 mg via INTRAVENOUS

## 2021-02-03 MED ORDER — LACTATED RINGERS IV SOLN: INTRAVENOUS | Status: DC

## 2021-02-03 NOTE — Anesthesia Postprocedure Evaluation (Signed)
Anesthesia Post Note  Patient: Thomas Rollins  Procedure(s) Performed: COLONOSCOPY WITH PROPOFOL  Patient location during evaluation: Phase II Anesthesia Type: General Level of consciousness: awake and alert and oriented Pain management: pain level controlled Vital Signs Assessment: post-procedure vital signs reviewed and stable Respiratory status: spontaneous breathing, nonlabored ventilation and respiratory function stable Cardiovascular status: blood pressure returned to baseline and stable Postop Assessment: no apparent nausea or vomiting Anesthetic complications: no   No notable events documented.   Last Vitals:  Vitals:   02/03/21 0829 02/03/21 0830  BP: 132/82 134/68  Pulse: 71 68  Resp: 16 16  Temp: 36.7 C 37 C  SpO2: 98% 97%    Last Pain:  Vitals:   02/03/21 0830  TempSrc: Tympanic  PainSc:                  Hendel Gatliff C Orlin Kann

## 2021-02-03 NOTE — Op Note (Signed)
[facility] Patient Name: Thomas Rollins Procedure Date: 02/03/2021 7:13 AM MRN: [id number] Date of Birth: March 05, 1960 Attending MD: Elon Alas. Abbey Chatters DO CSN: [id number] Age: 60 Admit Type: Outpatient Procedure:                Colonoscopy Indications:              Screening for colorectal malignant neoplasm Providers:                Elon Alas. Abbey Chatters, DO, Lambert Mody, Dereck Leep, Technician Referring MD:              Medicines:                See the Anesthesia note for documentation of the                            administered medications Complications:            No immediate complications. Estimated Blood Loss:     Estimated blood loss: none. Procedure:                Pre-Anesthesia Assessment:                           - The anesthesia plan was to use monitored                            anesthesia care (MAC).                           After obtaining informed consent, the colonoscope                            was passed under direct vision. Throughout the                            procedure, the patient's blood pressure, pulse, and                            oxygen saturations were monitored continuously. The                            [id number]-HQ190L ([id number]) scope was introduced through                            the anus and advanced to the the cecum, identified                            by appendiceal orifice and ileocecal valve. The                            colonoscopy was somewhat difficult due to a                            redundant colon. The patient tolerated  the                            procedure well. The quality of the bowel                            preparation was evaluated using the BBPS [facility]                            Bowel Preparation Scale) with scores of: Right                            Colon = 2 (minor amount of residual staining, small                            fragments of stool and/or opaque  liquid, but mucosa                            seen well), Transverse Colon = 2 (minor amount of                            residual staining, small fragments of stool and/or                            opaque liquid, but mucosa seen well) and Left Colon                            = 2 (minor amount of residual staining, small                            fragments of stool and/or opaque liquid, but mucosa                            seen well). The total BBPS score equals 6. The                            quality of the bowel preparation was fair. Scope In: 7:54:30 AM Scope Out: 8:20:23 AM Scope Withdrawal Time: 0 hours 18 minutes 44 seconds  Total Procedure Duration: 0 hours 25 minutes 53 seconds  Findings:      The perianal and digital rectal examinations were normal.      Non-bleeding internal hemorrhoids were found during endoscopy.      A moderate amount of stool was found in the entire colon, making       visualization difficult. Lavage of the area was performed using copious       amounts of sterile water, resulting in clearance with fair visualization.      The exam was otherwise without abnormality. Impression:               - Preparation of the colon was fair.                           - Non-bleeding internal hemorrhoids.                           -  Stool in the entire examined colon.                           - The examination was otherwise normal.                           - No specimens collected. Moderate Sedation:      Per Anesthesia Care Recommendation:           - Patient has a contact number available for                            emergencies. The signs and symptoms of potential                            delayed complications were discussed with the                            patient. Return to normal activities tomorrow.                            Written discharge instructions were provided to the                            patient.                           - Resume  previous diet.                           - Continue present medications.                           - Repeat colonoscopy in 5 years for screening                            purposes due to significant family history of                            cancer and borderline prep today. Procedure Code(s):        --- Professional ---                           K5993, Colorectal cancer screening; colonoscopy on                            individual not meeting criteria for high risk Diagnosis Code(s):        --- Professional ---                           Z12.11, Encounter for screening for malignant                            neoplasm of colon                           K64.8, Other  hemorrhoids CPT copyright 2019 American Medical Association. All rights reserved. The codes documented in this report are preliminary and upon coder review may  be revised to meet current compliance requirements. Elon Alas. Abbey Chatters, DO [place] Abbey Chatters, DO 02/03/2021 8:25:49 AM This report has been signed electronically. Number of Addenda: 0

## 2021-02-03 NOTE — H&P (Signed)
Primary Care Physician:  Noreene Larsson, NP Primary Gastroenterologist:  Dr. Abbey Chatters  Pre-Procedure History & Physical: HPI:  Thomas Rollins is a 60 y.o. male is here for a colonoscopy for colon cancer screening purposes.  Patient denies any family history of colorectal cancer.  No melena or hematochezia.  No abdominal pain or unintentional weight loss.  No change in bowel habits.  Overall feels well from a GI standpoint.  Past Medical History:  Diagnosis Date   Abnormal LFTs    Anxiety    panic attacks   Chronic pain    Depression    Diabetes mellitus without complication ([place])    Encounter for hepatitis C screening test for low risk patient 04/05/2019   Hyperlipidemia    Hypertension    Hypothyroidism    Sleep apnea    TIA (transient ischemic attack) 2019    Past Surgical History:  Procedure Laterality Date   ankle sx     ARTHROSCOPIC REPAIR ACL     CARPAL TUNNEL RELEASE     HERNIA REPAIR     SHOULDER ARTHROSCOPY      Prior to Admission medications   Medication Sig Start Date End Date Taking? Authorizing Provider  ALPHAGAN P 0.1 % SOLN Place 1 drop into both eyes 2 (two) times daily as needed (irritation). 08/18/19  Yes [provider]  AMBULATORY NON FORMULARY MEDICATION 1 vial by Intracavernosal route as needed. Medication Name:  Prostaglandin E1, 10 mcg vials.  Dispense 5 with 11 refills  Inject 1 vial as needed 10/08/20  Yes Franchot Gallo, MD  blood glucose meter kit and supplies Dispense based on patient and insurance preference. Use up to four times daily as directed. (FOR ICD-10 E10.9, E11.9). 03/27/20  Yes Noreene Larsson, NP  Continuous Blood Gluc Sensor (DEXCOM G6 SENSOR) MISC Change sensor every 10 days 10/17/20  Yes Noreene Larsson, NP  cyclobenzaprine (FLEXERIL) 10 MG tablet Take 10 mg by mouth 3 (three) times daily as needed for muscle spasms.   Yes [provider]  Diclofenac Sodium 3 % GEL Apply 1 g topically 4 (four) times daily as needed  (pain). 08/13/20  Yes [provider]  Dulaglutide (TRULICITY) 1.5 JJ/9.4RD SOPN Inject 1.5 mg into the skin once a week. 11/25/20  Yes Noreene Larsson, NP  DULoxetine (CYMBALTA) 60 MG capsule TAKE 1 CAPSULE BY MOUTH EVERY DAY 12/13/20  Yes Lindell Spar, MD  gabapentin (NEURONTIN) 300 MG capsule TAKE ONE CAPSULE BY MOUTH TWICE DAILY Patient taking differently: Take 300 mg by mouth daily. 10/07/20  Yes Noreene Larsson, NP  ibuprofen (ADVIL) 200 MG tablet Take 400 mg by mouth every 6 (six) hours as needed for moderate pain or headache.   Yes [provider]  levothyroxine (SYNTHROID) 137 MCG tablet Take 1 tablet (137 mcg total) by mouth every morning. on an empty stomach 12/20/20  Yes Lindell Spar, MD  lisinopril (ZESTRIL) 10 MG tablet TAKE 1 TABLET BY MOUTH DAILY 10/07/20  Yes Noreene Larsson, NP  meloxicam (MOBIC) 7.5 MG tablet TAKE 1 TABLET BY MOUTH DAILY 10/07/20  Yes Noreene Larsson, NP  metFORMIN (GLUCOPHAGE) 1000 MG tablet TAKE 1 TABLET BY MOUTH TWICE DAILY 01/03/21  Yes Noreene Larsson, NP  metoCLOPramide (REGLAN) 5 MG tablet Take 1 tablet (5 mg total) by mouth every 8 (eight) hours as needed for nausea or vomiting. 01/02/21  Yes Lindell Spar, MD  morphine (MS CONTIN) 30 MG 12 hr tablet Take  1 tablet by mouth 2 (two) times daily. Take with the 44m  07/01/16  Yes [provider]  morphine (MS CONTIN) 60 MG 12 hr tablet Take 1 tablet by mouth 2 (two) times daily. Takes with the 35m 07/01/16  Yes [provider]  Omega-3 Fatty Acids (FISH OIL) 1000 MG CAPS Take 2 capsules (2,000 mg total) by mouth in the morning and at bedtime. Patient taking differently: Take 1 capsule by mouth every morning. 04/03/20  Yes GrNoreene LarssonNP  omeprazole (PRILOSEC) 40 MG capsule Take 1 capsule (40 mg total) by mouth daily. 01/02/21  Yes PaLindell SparMD  Sodium Sulfate-Mag Sulfate-KCl (SUTAB) 14475-365-0103G TABS Take as directed. 11/22/20  Yes Arling Cerone K, DO  traZODone  (DESYREL) 50 MG tablet TAKE ONE-HALF TO 1 TABLET BY MOUTH AT BEDTIME AS NEEDED FOR SLEEP 01/21/21  Yes PaLindell SparMD  albuterol (VENTOLIN HFA) 108 (90 Base) MCG/ACT inhaler Inhale 2 puffs into the lungs every 6 (six) hours as needed for wheezing or shortness of breath. 09/17/20   GrNoreene LarssonNP  ALPRAZolam (XDuanne Moron0.5 MG tablet Take 1 tablet (0.5 mg total) by mouth 2 (two) times daily as needed for anxiety. 10/25/20   GrNoreene LarssonNP  GLOBAL EASE INJECT PEN NEEDLES 31G X 8 MM MISC 1 each by Other route as directed. 02/20/19   [provider]  insulin degludec (TRESIBA FLEXTOUCH) 200 UNIT/ML FlexTouch Pen Inject 100 Units into the skin daily. 01/14/21   PaLindell SparMD  naloxone (NTaylor Regional Hospitalnasal spray 4 mg/0.1 mL Place 1 spray into the nose as needed (opioid overdose).    [provider]  ONGold[facility] Community HospitalLTRA test strip USE UP TO FOUR TIMES DAILY AS DIRECTED (25 DAY supply) 07/30/20   GrNoreene LarssonNP  simvastatin (ZOCOR) 40 MG tablet TAKE 1 TABLET BY MOUTH EVERY EVENING 01/03/21   GrNoreene LarssonNP    Allergies as of 11/22/2020 - Review Complete 11/22/2020  Allergen Reaction Noted   Codeine Other (See Comments) 07/08/2016   Other  09/05/2017   Penicillins Rash 07/08/2016    Family History  Problem Relation Age of Onset   Stomach cancer Mother [id number] Heart Problems Father    Dementia Father    Cancer Father        "tumors"   Bone cancer Brother    Lung cancer Brother    Cancer Daughter 2411     Male cancer   Cancer Daughter 2469     Male   Colon cancer Neg Hx     Social History   Socioeconomic History   Marital status: Widowed    Spouse name: Not on file   Number of children: 1   Years of education: Not on file   Highest education level: 12th grade  Occupational History   Not on file  Tobacco Use   Smoking status: Former    Types: Cigarettes   Smokeless tobacco: Never  Vaping Use   Vaping Use: Never used  Substance and Sexual Activity    Alcohol use: Not Currently    Comment: none in 1 yr 10/2020   Drug use: Yes    Types: Marijuana    Comment: occas   Sexual activity: Never  Other Topics Concern   Not on file  Social History Narrative   Lives with good friend of his, known for 3053ears    Only daughter passed away from  cancer in Sept 09/2016       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   Takes time release morphine b/c fell 15-20 feet from a building while working.    Now disabled        Exercises/walks: 7 days a week   Has pets at home: 5 dogs in the home.      Diet: Avoids white foods, fruits, veggies, does eat some fast food/eat out    Caffeine: none only hot chocolate or soda when stomach is upset   Water: 8 cups daily if not more      Wears seat belt   Smoke detectors and carbon monoxide detectors at home    Does not use phone while driving                      Social Determinants of Radio broadcast assistant Strain: Low Risk    Difficulty of Paying Living Expenses: Not very hard  Food Insecurity: No Food Insecurity   Worried About Charity fundraiser in the Last Year: Never true   Ran Out of Food in the Last Year: Never true  Transportation Needs: No Transportation Needs   Lack of Transportation (Medical): No   Lack of Transportation (Non-Medical): No  Physical Activity: Sufficiently Active   Days of Exercise per Week: 4 days   Minutes of Exercise per Session: 40 min  Stress: Stress Concern Present   Feeling of Stress : To some extent  Social Connections: Socially Isolated   Frequency of Communication with Friends and Family: Twice a week   Frequency of Social Gatherings with Friends and Family: Never   Attends Religious Services: Never   Printmaker: No   Attends Music therapist: Never   Marital Status: Divorced  Human resources officer Violence: Not At Risk   Fear of Current or Ex-Partner: No   Emotionally Abused: No   Physically Abused: No   Sexually  Abused: No    Review of Systems: See HPI, otherwise negative ROS  Physical Exam: Vital signs in last 24 hours: Temp:  [98.4 F (36.9 C)] 98.4 F (36.9 C) (12/12 0701) Pulse Rate:  [68] 68 (12/12 0701) Resp:  [12] 12 (12/12 0701) BP: (142)/(78) 142/78 (12/12 0701) SpO2:  [99 %] 99 % (12/12 0701) Weight:  [104.3 kg] 104.3 kg (12/12 0701)   General:   Alert,  Well-developed, well-nourished, pleasant and cooperative in NAD Head:  Normocephalic and atraumatic. Eyes:  Sclera clear, no icterus.   Conjunctiva pink. Ears:  Normal auditory acuity. Nose:  No deformity, discharge,  or lesions. Mouth:  No deformity or lesions, dentition normal. Neck:  Supple; no masses or thyromegaly. Lungs:  Clear throughout to auscultation.   No wheezes, crackles, or rhonchi. No acute distress. Heart:  Regular rate and rhythm; no murmurs, clicks, rubs,  or gallops. Abdomen:  Soft, nontender and nondistended. No masses, hepatosplenomegaly or hernias noted. Normal bowel sounds, without guarding, and without rebound.   Msk:  Symmetrical without gross deformities. Normal posture. Extremities:  Without clubbing or edema. Neurologic:  Alert and  oriented x4;  grossly normal neurologically. Skin:  Intact without significant lesions or rashes. Cervical Nodes:  No significant cervical adenopathy. Psych:  Alert and cooperative. Normal mood and affect.  Impression/Plan: RICH PAPROCKI is here for a colonoscopy to be performed for colon cancer screening purposes.  The risks of the procedure including infection, bleed, or perforation as well as benefits,  limitations, alternatives and imponderables have been reviewed with the patient. Questions have been answered. All parties agreeable.

## 2021-02-03 NOTE — Anesthesia Preprocedure Evaluation (Signed)
Anesthesia Evaluation  Patient identified by MRN, date of birth, ID band Patient awake    Reviewed: Allergy & Precautions, H&P , NPO status , Patient's Chart, lab work & pertinent test results  Airway Mallampati: II  TM Distance: >3 FB Neck ROM: Full    Dental  (+) Edentulous Upper, Edentulous Lower   Pulmonary sleep apnea and Continuous Positive Airway Pressure Ventilation , former smoker,    Pulmonary exam normal breath sounds clear to auscultation       Cardiovascular Exercise Tolerance: Good hypertension, Pt. on medications Normal cardiovascular exam Rhythm:Regular Rate:Normal     Neuro/Psych PSYCHIATRIC DISORDERS Anxiety Depression TIA   GI/Hepatic negative GI ROS, (+)     substance abuse (last use - 02/02/21 7pm)  marijuana use,   Endo/Other  diabetes, Well Controlled, Type 2, Insulin DependentHypothyroidism   Renal/GU negative Renal ROS  negative genitourinary   Musculoskeletal negative musculoskeletal ROS (+)   Abdominal   Peds negative pediatric ROS (+)  Hematology negative hematology ROS (+)   Anesthesia Other Findings H/o trauma  Reproductive/Obstetrics negative OB ROS                           Anesthesia Physical Anesthesia Plan  ASA: 3  Anesthesia Plan: General   Post-op Pain Management: Minimal or no pain anticipated   Induction: Intravenous  PONV Risk Score and Plan: TIVA  Airway Management Planned: Nasal Cannula, Natural Airway and Simple Face Mask  Additional Equipment:   Intra-op Plan:   Post-operative Plan:   Informed Consent: I have reviewed the patients History and Physical, chart, labs and discussed the procedure including the risks, benefits and alternatives for the proposed anesthesia with the patient or authorized representative who has indicated his/her understanding and acceptance.     Dental advisory given  Plan Discussed with: CRNA and  Surgeon  Anesthesia Plan Comments:        Anesthesia Quick Evaluation

## 2021-02-03 NOTE — Transfer of Care (Signed)
Immediate Anesthesia Transfer of Care Note  Patient: Thomas Rollins  Procedure(s) Performed: COLONOSCOPY WITH PROPOFOL  Patient Location: PACU  Anesthesia Type:MAC  Level of Consciousness: awake, alert  and oriented  Airway & Oxygen Therapy: Patient Spontanous Breathing  Post-op Assessment: Report given to RN and Post -op Vital signs reviewed and stable  Post vital signs: Reviewed and stable  Last Vitals:  Vitals Value Taken Time  BP 134/68 02/03/21 0830  Temp 37 C 02/03/21 0830  Pulse 68 02/03/21 0830  Resp 16 02/03/21 0830  SpO2 97 % 02/03/21 0830    Last Pain:  Vitals:   02/03/21 0830  TempSrc: Tympanic  PainSc:       Patients Stated Pain Goal: 7 (93/79/02 4097)  Complications: No notable events documented.

## 2021-02-03 NOTE — Discharge Instructions (Signed)
  Colonoscopy Discharge Instructions  Read the instructions outlined below and refer to this sheet in the next few weeks. These discharge instructions provide you with general information on caring for yourself after you leave the hospital. Your doctor may also give you specific instructions. While your treatment has been planned according to the most current medical practices available, unavoidable complications occasionally occur.   ACTIVITY You may resume your regular activity, but move at a slower pace for the next 24 hours.  Take frequent rest periods for the next 24 hours.  Walking will help get rid of the air and reduce the bloated feeling in your belly (abdomen).  No driving for 24 hours (because of the medicine (anesthesia) used during the test).   Do not sign any important legal documents or operate any machinery for 24 hours (because of the anesthesia used during the test).  NUTRITION Drink plenty of fluids.  You may resume your normal diet as instructed by your doctor.  Begin with a light meal and progress to your normal diet. Heavy or fried foods are harder to digest and may make you feel sick to your stomach (nauseated).  Avoid alcoholic beverages for 24 hours or as instructed.  MEDICATIONS You may resume your normal medications unless your doctor tells you otherwise.  WHAT YOU CAN EXPECT TODAY Some feelings of bloating in the abdomen.  Passage of more gas than usual.  Spotting of blood in your stool or on the toilet paper.  IF YOU HAD POLYPS REMOVED DURING THE COLONOSCOPY: No aspirin products for 7 days or as instructed.  No alcohol for 7 days or as instructed.  Eat a soft diet for the next 24 hours.  FINDING OUT THE RESULTS OF YOUR TEST Not all test results are available during your visit. If your test results are not back during the visit, make an appointment with your caregiver to find out the results. Do not assume everything is normal if you have not heard from your  caregiver or the medical facility. It is important for you to follow up on all of your test results.  SEEK IMMEDIATE MEDICAL ATTENTION IF: You have more than a spotting of blood in your stool.  Your belly is swollen (abdominal distention).  You are nauseated or vomiting.  You have a temperature over 101.  You have abdominal pain or discomfort that is severe or gets worse throughout the day.   Your colonoscopy was relatively unremarkable.  I did not find any polyps or evidence of colon cancer. However, given your significant family history of cancer I recommend repeating colonoscopy in 5 years for colon cancer screening purposes.  Follow-up with GI as needed.   I hope you have a great rest of your week!  Hennie Duos. Marletta Lor, D.O. Gastroenterology and Hepatology [facility]

## 2021-02-05 ENCOUNTER — Encounter (HOSPITAL_COMMUNITY): Payer: Self-pay | Admitting: Internal Medicine

## 2021-02-10 ENCOUNTER — Ambulatory Visit (INDEPENDENT_AMBULATORY_CARE_PROVIDER_SITE_OTHER): Payer: Medicare HMO | Admitting: Nurse Practitioner

## 2021-02-10 ENCOUNTER — Encounter: Payer: Self-pay | Admitting: Nurse Practitioner

## 2021-02-10 ENCOUNTER — Other Ambulatory Visit: Payer: Self-pay

## 2021-02-10 VITALS — BP 124/77 | HR 79 | Ht 68.0 in | Wt 236.0 lb

## 2021-02-10 DIAGNOSIS — E1169 Type 2 diabetes mellitus with other specified complication: Secondary | ICD-10-CM | POA: Diagnosis not present

## 2021-02-10 DIAGNOSIS — E785 Hyperlipidemia, unspecified: Secondary | ICD-10-CM

## 2021-02-10 DIAGNOSIS — Z794 Long term (current) use of insulin: Secondary | ICD-10-CM

## 2021-02-10 DIAGNOSIS — I10 Essential (primary) hypertension: Secondary | ICD-10-CM | POA: Diagnosis not present

## 2021-02-10 DIAGNOSIS — E039 Hypothyroidism, unspecified: Secondary | ICD-10-CM | POA: Diagnosis not present

## 2021-02-10 DIAGNOSIS — E119 Type 2 diabetes mellitus without complications: Secondary | ICD-10-CM | POA: Diagnosis not present

## 2021-02-10 NOTE — Assessment & Plan Note (Signed)
-  checked thyroid labs today; recheck in about 6 months

## 2021-02-10 NOTE — Patient Instructions (Signed)
Please have fasting labs drawn 2-3 days prior to your appointment so we can discuss the results during your office visit.  

## 2021-02-10 NOTE — Progress Notes (Signed)
Acute Office Visit  Subjective:    Patient ID: Thomas Rollins, male    DOB: 12-13-1960, 60 y.o.   MRN: [id number]  Chief Complaint  Patient presents with   Follow-up    Blood sugar has came down will know what last A1C is tomorrow.    HPI Patient is in today for follow-up for T2DM. He had fasting labs drawn today.  He has Dexcom for CGM. He has was taking levemir 50 units BID, but that was to be switched to [place] 100 units daily  He states when he did 100 untis of Tresiba, his blood sugar dropped to 40, so he has been using 75 units daily and he has been avoiding hypoglycemic episodes.  Past Medical History:  Diagnosis Date   Abnormal LFTs    Anxiety    panic attacks   Chronic pain    Depression    Diabetes mellitus without complication ([place])    Encounter for hepatitis C screening test for low risk patient 04/05/2019   Hyperlipidemia    Hypertension    Hypothyroidism    Sleep apnea    TIA (transient ischemic attack) 2019    Past Surgical History:  Procedure Laterality Date   ankle sx     ARTHROSCOPIC REPAIR ACL     CARPAL TUNNEL RELEASE     COLONOSCOPY WITH PROPOFOL N/A 02/03/2021   Procedure: COLONOSCOPY WITH PROPOFOL;  Surgeon: Eloise Harman, DO;  Location: [facility];  Service: Endoscopy;  Laterality: N/A;  8:00am   HERNIA REPAIR     SHOULDER ARTHROSCOPY      Family History  Problem Relation Age of Onset   Stomach cancer Mother 40   Heart Problems Father    Dementia Father    Cancer Father        "tumors"   Bone cancer Brother    Lung cancer Brother    Cancer Daughter 16       Male cancer   Cancer Daughter 37       Male   Colon cancer Neg Hx     Social History   Socioeconomic History   Marital status: Widowed    Spouse name: Not on file   Number of children: 1   Years of education: Not on file   Highest education level: 12th grade  Occupational History   Not on file  Tobacco Use   Smoking status: Former    Types: Cigarettes    Smokeless tobacco: Never  Vaping Use   Vaping Use: Never used  Substance and Sexual Activity   Alcohol use: Not Currently    Comment: none in 1 yr 10/2020   Drug use: Yes    Types: Marijuana    Comment: occas   Sexual activity: Never  Other Topics Concern   Not on file  Social History Narrative   Lives with good friend of his, known for 24 years    Only daughter passed away from cancer in 11-03-16       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   Takes time release morphine b/c fell 15-20 feet from a building while working.    Now disabled        Exercises/walks: 7 days a week   Has pets at home: 5 dogs in the home.      Diet: Avoids white foods, fruits, veggies, does eat some fast food/eat out    Caffeine: none only hot chocolate or soda when stomach is upset  Water: 8 cups daily if not more      Wears seat belt   Smoke detectors and carbon monoxide detectors at home    Does not use phone while driving                      Social Determinants of Radio broadcast assistant Strain: Low Risk    Difficulty of Paying Living Expenses: Not very hard  Food Insecurity: No Food Insecurity   Worried About Charity fundraiser in the Last Year: Never true   Arboriculturist in the Last Year: Never true  Transportation Needs: No Transportation Needs   Lack of Transportation (Medical): No   Lack of Transportation (Non-Medical): No  Physical Activity: Sufficiently Active   Days of Exercise per Week: 4 days   Minutes of Exercise per Session: 40 min  Stress: Stress Concern Present   Feeling of Stress : To some extent  Social Connections: Socially Isolated   Frequency of Communication with Friends and Family: Twice a week   Frequency of Social Gatherings with Friends and Family: Never   Attends Religious Services: Never   Printmaker: No   Attends Music therapist: Never   Marital Status: Divorced  Human resources officer Violence: Not At Risk    Fear of Current or Ex-Partner: No   Emotionally Abused: No   Physically Abused: No   Sexually Abused: No    Outpatient Medications Prior to Visit  Medication Sig Dispense Refill   albuterol (VENTOLIN HFA) 108 (90 Base) MCG/ACT inhaler Inhale 2 puffs into the lungs every 6 (six) hours as needed for wheezing or shortness of breath. 8 g 0   ALPHAGAN P 0.1 % SOLN Place 1 drop into both eyes 2 (two) times daily as needed (irritation).     ALPRAZolam (XANAX) 0.5 MG tablet Take 1 tablet (0.5 mg total) by mouth 2 (two) times daily as needed for anxiety. 14 tablet 0   AMBULATORY NON FORMULARY MEDICATION 1 vial by Intracavernosal route as needed. Medication Name:  Prostaglandin E1, 10 mcg vials.  Dispense 5 with 11 refills  Inject 1 vial as needed 5 vial 11   blood glucose meter kit and supplies Dispense based on patient and insurance preference. Use up to four times daily as directed. (FOR ICD-10 E10.9, E11.9). 1 each 0   Continuous Blood Gluc Sensor (DEXCOM G6 SENSOR) MISC Change sensor every 10 days 3 each 11   cyclobenzaprine (FLEXERIL) 10 MG tablet Take 10 mg by mouth 3 (three) times daily as needed for muscle spasms.     Diclofenac Sodium 3 % GEL Apply 1 g topically 4 (four) times daily as needed (pain).     Dulaglutide (TRULICITY) 1.5 DH/6.8SH SOPN Inject 1.5 mg into the skin once a week. 6 mL 1   DULoxetine (CYMBALTA) 60 MG capsule TAKE 1 CAPSULE BY MOUTH EVERY DAY 90 capsule 2   gabapentin (NEURONTIN) 300 MG capsule TAKE ONE CAPSULE BY MOUTH TWICE DAILY (Patient taking differently: Take 300 mg by mouth daily.) 60 capsule 10   GLOBAL EASE INJECT PEN NEEDLES 31G X 8 MM MISC 1 each by Other route as directed.     ibuprofen (ADVIL) 200 MG tablet Take 400 mg by mouth every 6 (six) hours as needed for moderate pain or headache.     insulin degludec (TRESIBA FLEXTOUCH) 200 UNIT/ML FlexTouch Pen Inject 100 Units into the skin daily. 18 mL 2  levothyroxine (SYNTHROID) 137 MCG tablet Take 1 tablet  (137 mcg total) by mouth every morning. on an empty stomach 30 tablet 2   lisinopril (ZESTRIL) 10 MG tablet TAKE 1 TABLET BY MOUTH DAILY 30 tablet 10   meloxicam (MOBIC) 7.5 MG tablet TAKE 1 TABLET BY MOUTH DAILY 30 tablet 10   metFORMIN (GLUCOPHAGE) 1000 MG tablet TAKE 1 TABLET BY MOUTH TWICE DAILY 180 tablet 1   metoCLOPramide (REGLAN) 5 MG tablet Take 1 tablet (5 mg total) by mouth every 8 (eight) hours as needed for nausea or vomiting. 20 tablet 0   morphine (MS CONTIN) 30 MG 12 hr tablet Take 1 tablet by mouth 2 (two) times daily. Take with the 76m      morphine (MS CONTIN) 60 MG 12 hr tablet Take 1 tablet by mouth 2 (two) times daily. Takes with the 366m     naloxone (NARCAN) nasal spray 4 mg/0.1 mL Place 1 spray into the nose as needed (opioid overdose).     Omega-3 Fatty Acids (FISH OIL) 1000 MG CAPS Take 2 capsules (2,000 mg total) by mouth in the morning and at bedtime. (Patient taking differently: Take 1 capsule by mouth every morning.) 360 capsule 3   omeprazole (PRILOSEC) 40 MG capsule Take 1 capsule (40 mg total) by mouth daily. 30 capsule 3   ONETOUCH ULTRA test strip USE UP TO FOUR TIMES DAILY AS DIRECTED (25 DAY supply) 100 strip 11   simvastatin (ZOCOR) 40 MG tablet TAKE 1 TABLET BY MOUTH EVERY EVENING 90 tablet 1   traZODone (DESYREL) 50 MG tablet TAKE ONE-HALF TO 1 TABLET BY MOUTH AT BEDTIME AS NEEDED FOR SLEEP 30 tablet 3   No facility-administered medications prior to visit.    Allergies  Allergen Reactions   Codeine Other (See Comments)    Slurred Speech    Other     [place] N/V   sweat potatoes - N/V    Penicillins Rash    Review of Systems  Constitutional: Negative.   Respiratory: Negative.    Cardiovascular: Negative.   Endocrine:       He reports no blood sugars over 300 in the last 45 days; usually blood sugar is less than 200.  Psychiatric/Behavioral:  Negative for self-injury and suicidal ideas. The patient is nervous/anxious.       Objective:     Physical Exam Constitutional:      Appearance: Normal appearance. He is obese.  Cardiovascular:     Rate and Rhythm: Normal rate and regular rhythm.     Pulses: Normal pulses.     Heart sounds: Normal heart sounds.  Pulmonary:     Effort: Pulmonary effort is normal.     Breath sounds: Normal breath sounds.  Neurological:     Mental Status: He is alert.  Psychiatric:     Comments: He endorses smoking MJ    BP 124/77    Pulse 79    Ht _0  (1.727 m)    Wt 236 lb 0.6 oz (107.1 kg)    SpO2 98%    BMI 35.89 kg/m  Wt Readings from Last 3 Encounters:  02/10/21 236 lb 0.6 oz (107.1 kg)  02/03/21 230 lb (104.3 kg)  01/31/21 230 lb (104.3 kg)    Health Maintenance Due  Topic Date Due   Pneumococcal Vaccine 1950458ears old (1 - PCV) Never done   OPHTHALMOLOGY EXAM  Never done   TETANUS/TDAP  Never done   Zoster Vaccines- Shingrix (1  of 2) Never done   FOOT EXAM  05/23/2020   COVID-19 Vaccine (3 - Booster for Moderna series) 06/25/2020    There are no preventive care reminders to display for this patient.   Lab Results  Component Value Date   TSH 7.980 (H) 12/03/2020   Lab Results  Component Value Date   WBC 7.1 12/03/2020   HGB 15.5 12/03/2020   HCT 46.9 12/03/2020   MCV 87 12/03/2020   PLT 206 12/03/2020   Lab Results  Component Value Date   NA 135 01/31/2021   K 4.3 01/31/2021   CO2 26 01/31/2021   GLUCOSE 147 (H) 01/31/2021   BUN 25 (H) 01/31/2021   CREATININE 0.77 01/31/2021   BILITOT 0.5 12/03/2020   ALKPHOS 83 12/03/2020   AST 28 12/03/2020   ALT 43 12/03/2020   PROT 6.8 12/03/2020   ALBUMIN 4.6 12/03/2020   CALCIUM 9.4 01/31/2021   ANIONGAP 8 01/31/2021   EGFR 97 12/03/2020   Lab Results  Component Value Date   CHOL 138 12/03/2020   Lab Results  Component Value Date   HDL 36 (L) 12/03/2020   Lab Results  Component Value Date   LDLCALC 70 12/03/2020   Lab Results  Component Value Date   TRIG 193 (H) 12/03/2020   Lab Results  Component  Value Date   CHOLHDL 3.7 02/05/2017   Lab Results  Component Value Date   HGBA1C 9.7 (H) 12/03/2020       Assessment & Plan:   Problem List Items Addressed This Visit       Cardiovascular and Mediastinum   Essential hypertension    BP Readings from Last 3 Encounters:  02/10/21 124/77  02/03/21 134/68  01/31/21 126/87  -well controlled      Relevant Orders   CBC with Differential/Platelet   CMP14+EGFR   Lipid Panel With LDL/HDL Ratio     Endocrine   Diabetes mellitus type 2, insulin dependent ([facility]) - Primary    -checking A1c; looked at Dexcom, and blood sugars have improved      Relevant Orders   CBC with Differential/Platelet   CMP14+EGFR   Lipid Panel With LDL/HDL Ratio   Hemoglobin A1c   Hyperlipidemia associated with type 2 diabetes mellitus ([place])    -checked lipid panel today      Relevant Orders   Lipid Panel With LDL/HDL Ratio   Hypothyroidism    -checked thyroid labs today; recheck in about 6 months        No orders of the defined types were placed in this encounter.    Noreene Larsson, NP

## 2021-02-10 NOTE — Assessment & Plan Note (Signed)
BP Readings from Last 3 Encounters:  02/10/21 124/77  02/03/21 134/68  01/31/21 126/87   -well controlled

## 2021-02-10 NOTE — Assessment & Plan Note (Signed)
-  checked lipid panel today

## 2021-02-10 NOTE — Assessment & Plan Note (Signed)
-  checking A1c; looked at [facility], and blood sugars have improved

## 2021-02-11 ENCOUNTER — Ambulatory Visit (INDEPENDENT_AMBULATORY_CARE_PROVIDER_SITE_OTHER): Payer: Medicare HMO | Admitting: Pharmacist

## 2021-02-11 DIAGNOSIS — E785 Hyperlipidemia, unspecified: Secondary | ICD-10-CM

## 2021-02-11 DIAGNOSIS — Z794 Long term (current) use of insulin: Secondary | ICD-10-CM

## 2021-02-11 DIAGNOSIS — I10 Essential (primary) hypertension: Secondary | ICD-10-CM

## 2021-02-11 MED ORDER — TRESIBA FLEXTOUCH 200 UNIT/ML ~~LOC~~ SOPN: 76.0000 [IU] | PEN_INJECTOR | Freq: Every day | SUBCUTANEOUS | Status: DC

## 2021-02-11 MED ORDER — GLOBAL EASE INJECT PEN NEEDLES 31G X 8 MM MISC: 3 refills | Status: AC

## 2021-02-11 NOTE — Patient Instructions (Signed)
Thomas Rollins,  It was great to talk to you today!  Please call me with any questions or concerns.   Visit Information  Following are the goals we discussed today:  Patient Goals/Self-Care Activities Over the next 90 days, patient will:  Take medications as prescribed Check blood sugar continuously with continuous glucose monitor, document, and provide at future appointments Engage in dietary modifications by fewer sweetened foods & beverages and watch portion sizes/amount of food eaten at one time  Plan: Face to Face appointment with care management team member scheduled for: 03/13/21  Domenic Moras, PharmD, BCACP, CPP Clinical Pharmacist Practitioner [facility] [phone]  Please call the care guide team at [phone] if you need to cancel or reschedule your appointment.   Patient verbalizes understanding of instructions provided today and agrees to view in MyChart.

## 2021-02-11 NOTE — Chronic Care Management (AMB) (Signed)
Chronic Care Management Pharmacy Note  02/11/2021 Name:  Thomas Rollins MRN:  [id number] DOB:  08/16/1960  Summary: Type 2 Diabetes Uncontrolled per last A1c but improving per continuous glucose monitor Patient reports he was having hypoglycemia with Thomas Rollins 100 units so he self decreased to 75 units daily Patient reports decreased appetite with GLP-1 agonist Current glucose readings: see CGM report below (01/13/21-02/11/21). Major improvement in blood glucose since last visit (after switch from Levemir to [place]) with 87% of the time being in the normal range. Only 12% high and 1% hypoglycemia per CGM report.  Discussed with PCP Thomas Rollins) and will decrease Tresiba to 76 units (rounded to the nearest 2) once daily since patient self decreased to this and blood glucose significantly improved Continue to monitor blood glucose with Dexcom G6. Low blood glucose alarm is set for <70. High blood glucose alarm adjusted to >250 today. Will follow-up with blood glucose via Dexcom and recommend adjustments to medications as necessary.   Refilled insulin pen tips today per patient request after discussion with PCP.       Subjective: Thomas Rollins is an 60 y.o. year old male who is Rollins primary patient of Thomas Rival, FNP.  The CCM team was consulted for assistance with disease management and care coordination needs.    Engaged with patient face to face for follow up visit in response to provider referral for pharmacy case management and/or care coordination services.   Consent to Services:  The patient was given information about Chronic Care Management services, agreed to services, and gave verbal consent prior to initiation of services.  Please see initial visit note for detailed documentation.   Patient Care Team: Thomas Rival, FNP as PCP - General (Nurse Practitioner) Thomas Macadam, MD as PCP - Family Medicine (Family Medicine) Thomas Harman, DO as Consulting  Physician (Gastroenterology) Thomas Rollins, [facility] (Pharmacist)  Objective:  Lab Results  Component Value Date   CREATININE 0.77 01/31/2021   CREATININE 0.89 12/03/2020   CREATININE 0.84 08/02/2020    Lab Results  Component Value Date   HGBA1C 9.7 (H) 12/03/2020   Last diabetic Eye exam: No results found for: HMDIABEYEEXA  Last diabetic Foot exam: No results found for: HMDIABFOOTEX      Component Value Date/Time   CHOL 138 12/03/2020 1026   TRIG 193 (H) 12/03/2020 1026   HDL 36 (L) 12/03/2020 1026   CHOLHDL 3.7 02/05/2017 1101   LDLCALC 70 12/03/2020 1026   LDLCALC 87 02/05/2017 1101    Hepatic Function Latest Ref Rng & Units 12/03/2020 12/03/2020 08/02/2020  Total Protein 6.0 - 8.5 g/dL 6.8 6.6 7.3  Albumin 3.8 - 4.8 g/dL 4.6 4.7 5.1(H)  AST 0 - 40 IU/L 28 28 46(H)  ALT 0 - 44 IU/L 43 44 72(H)  Alk Phosphatase 44 - 121 IU/L 83 79 66  Total Bilirubin 0.0 - 1.2 mg/dL 0.5 0.4 0.6  Bilirubin, Direct 0.00 - 0.40 mg/dL - 0.13 -    Lab Results  Component Value Date/Time   TSH 7.980 (H) 12/03/2020 10:26 AM   TSH 2.190 08/02/2020 10:56 AM   FREET4 0.92 12/03/2020 10:26 AM   FREET4 1.35 08/02/2020 10:56 AM    CBC Latest Ref Rng & Units 12/03/2020 08/02/2020 04/29/2020  WBC 3.4 - 10.8 x10E3/uL 7.1 7.5 6.1  Hemoglobin 13.0 - 17.7 g/dL 15.5 15.4 14.9  Hematocrit 37.5 - 51.0 % 46.9 46.5 45.4  Platelets 150 - 450 x10E3/uL 206 206 184  Lab Results  Component Value Date/Time   VD25OH 33.1 01/31/2020 11:28 AM    Clinical ASCVD: No  The 10-year ASCVD risk score (Arnett DK, et al., 2019) is: 18.8%   Values used to calculate the score:     Age: 46 years     Sex: Male     Is Non-Hispanic African American: No     Diabetic: Yes     Tobacco smoker: No     Systolic Blood Pressure: 914 mmHg     Is BP treated: Yes     HDL Cholesterol: 36 mg/dL     Total Cholesterol: 138 mg/dL    Social History   Tobacco Use  Smoking Status Former   Types: Cigarettes  Smokeless  Tobacco Never   BP Readings from Last 3 Encounters:  02/10/21 124/77  02/03/21 134/68  01/31/21 126/87   Pulse Readings from Last 3 Encounters:  02/10/21 79  02/03/21 68  01/31/21 100   Wt Readings from Last 3 Encounters:  02/10/21 236 lb 0.6 oz (107.1 kg)  02/03/21 230 lb (104.3 kg)  01/31/21 230 lb (104.3 kg)    Assessment: Review of patient past medical history, allergies, medications, health status, including review of consultants reports, laboratory and other test data, was performed as part of comprehensive evaluation and provision of chronic care management services.   SDOH:  (Social Determinants of Health) assessments and interventions performed:    CCM Care Plan  Allergies  Allergen Reactions   Codeine Other (See Comments)    Slurred Speech    Other     [place] - N/V   sweat potatoes - N/V    Penicillins Rash    Medications Reviewed Today     Reviewed by Thomas Larsson, NP (Nurse Practitioner) on 02/10/21 at 1202  Med List Status: <None>   Medication Order Taking? Sig Documenting Provider Last Dose Status Informant  albuterol (VENTOLIN HFA) 108 (90 Base) MCG/ACT inhaler [id number] Yes Inhale 2 puffs into the lungs every 6 (six) hours as needed for wheezing or shortness of breath. Thomas Larsson, NP Taking Active Self  ALPHAGAN P 0.1 % SOLN [id number] Yes Place 1 drop into both eyes 2 (two) times daily as needed (irritation). [provider] Taking Active Self           Med Note Thomas Rollins   Tue Dec 17, 2020  2:48 PM)    ALPRAZolam Thomas Rollins) 0.5 MG tablet [id number] Yes Take 1 tablet (0.5 mg total) by mouth 2 (two) times daily as needed for anxiety. Thomas Larsson, NP Taking Active Self  AMBULATORY NON FORMULARY MEDICATION [id number] Yes 1 vial by Intracavernosal route as needed. Medication Name:  Prostaglandin E1, 10 mcg vials.  Dispense 5 with 11 refills  Inject 1 vial as needed Thomas Gallo, MD Taking Active Self  blood glucose meter kit and  supplies [id number] Yes Dispense based on patient and insurance preference. Use up to four times daily as directed. (FOR ICD-10 E10.9, E11.9). Thomas Larsson, NP Taking Active Self  Continuous Blood Gluc Sensor (DEXCOM G6 SENSOR) MISC [id number] Yes Change sensor every 10 days Thomas Larsson, NP Taking Active Self  cyclobenzaprine (FLEXERIL) 10 MG tablet [id number] Yes Take 10 mg by mouth 3 (three) times daily as needed for muscle spasms. [provider] Taking Active Self  Diclofenac Sodium 3 % GEL [id number] Yes Apply 1 g topically 4 (four) times daily as needed (pain). [provider] Taking Active Self  Dulaglutide (TRULICITY)  1.5 MG/0.5ML SOPN [id number] Yes Inject 1.5 mg into the skin once Rollins week. Thomas Larsson, NP Taking Active Self           Med Note Thomas Rollins   Tue Dec 17, 2020  2:45 PM) Tuesdays  DULoxetine (CYMBALTA) 60 MG capsule [id number] Yes TAKE 1 CAPSULE BY MOUTH EVERY DAY Lindell Spar, MD Taking Active Self  gabapentin (NEURONTIN) 300 MG capsule [id number] Yes TAKE ONE CAPSULE BY MOUTH TWICE DAILY  Patient taking differently: Take 300 mg by mouth daily.   Thomas Larsson, NP Taking Active   GLOBAL EASE INJECT PEN NEEDLES 31G X 8 MM [place] [id number] Yes 1 each by Other route as directed. [provider] Taking Active Self  ibuprofen (ADVIL) 200 MG tablet [id number] Yes Take 400 mg by mouth every 6 (six) hours as needed for moderate pain or headache. [provider] Taking Active Self  insulin degludec (TRESIBA FLEXTOUCH) 200 UNIT/ML FlexTouch Pen [id number] Yes Inject 100 Units into the skin daily. Lindell Spar, MD Taking Active   levothyroxine (SYNTHROID) 137 MCG tablet [id number] Yes Take 1 tablet (137 mcg total) by mouth every morning. on an empty stomach Lindell Spar, MD Taking Active   lisinopril (ZESTRIL) 10 MG tablet [id number] Yes TAKE 1 TABLET BY MOUTH DAILY Thomas Larsson, NP Taking Active Self  meloxicam (MOBIC) 7.5 MG tablet [id number]  Yes TAKE 1 TABLET BY MOUTH DAILY Thomas Larsson, NP Taking Active Self  metFORMIN (GLUCOPHAGE) 1000 MG tablet [id number] Yes TAKE 1 TABLET BY MOUTH TWICE DAILY Thomas Larsson, NP Taking Active   metoCLOPramide (REGLAN) 5 MG tablet [id number] Yes Take 1 tablet (5 mg total) by mouth every 8 (eight) hours as needed for nausea or vomiting. Lindell Spar, MD Taking Active   morphine (MS CONTIN) 30 MG 12 hr tablet [id number] Yes Take 1 tablet by mouth 2 (two) times daily. Take with the $Remove'60mg'gGtsfxJ$   [provider] Taking Active Self  morphine (MS CONTIN) 60 MG 12 hr tablet [id number] Yes Take 1 tablet by mouth 2 (two) times daily. Takes with the $Remove'30mg'hNQHVDq$   [provider] Taking Active Self  naloxone [facility]) nasal spray 4 mg/0.1 mL [id number] Yes Place 1 spray into the nose as needed (opioid overdose). [provider] Taking Active Self  Omega-3 Fatty Acids (FISH OIL) 1000 MG CAPS [id number] Yes Take 2 capsules (2,000 mg total) by mouth in the morning and at bedtime.  Patient taking differently: Take 1 capsule by mouth every morning.   Thomas Larsson, NP Taking Active   omeprazole (PRILOSEC) 40 MG capsule [id number] Yes Take 1 capsule (40 mg total) by mouth daily. Lindell Spar, MD Taking Active   [facility] ULTRA test strip [id number] Yes USE UP TO FOUR TIMES DAILY AS DIRECTED (25 DAY supply) Thomas Larsson, NP Taking Active Self  simvastatin (ZOCOR) 40 MG tablet [id number] Yes TAKE 1 TABLET BY MOUTH EVERY EVENING Thomas Larsson, NP Taking Active   traZODone (DESYREL) 50 MG tablet [id number] Yes TAKE ONE-HALF TO 1 TABLET BY MOUTH AT BEDTIME AS NEEDED FOR SLEEP Lindell Spar, MD Taking Active             Patient Active Problem List   Diagnosis Date Noted   Abnormal LFTs    Encounter for screening colonoscopy    Erectile dysfunction 08/01/2020   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 03/27/2020   Sleep apnea 01/31/2020   CPAP (continuous positive airway pressure) dependence 10/23/2019  Opioid  contract exists 10/23/2019   Edentulous 10/23/2019   Insomnia, unspecified 10/23/2019   Body mass index (BMI) 37.0-37.9, adult 09/12/2019   Elevated blood-pressure reading, without diagnosis of hypertension 09/12/2019   Anxiety 04/12/2019   Panic attacks 04/12/2019   Vitamin D deficiency 04/11/2019   Hypothyroidism 04/11/2019   Diabetes mellitus type 2, insulin dependent ([place]) 02/05/2017   Depression, recurrent ([place]) 02/05/2017   Hyperlipidemia associated with type 2 diabetes mellitus ([place]) 02/05/2017   Essential hypertension 02/05/2017    Immunization History  Administered Date(s) Administered   Influenza,inj,quad, With Preservative 12/16/2018   Moderna SARS-COV2 Booster Vaccination 04/30/2020   Moderna Sars-Covid-2 Vaccination 09/20/2019, 10/18/2019    Conditions to be addressed/monitored: HTN, HLD, and DMII  Care Plan : Medication Management  Updates made by Thomas Rollins, [place] since 02/11/2021 12:00 AM     Problem: T2DM, HTN, HLD   Priority: High  Onset Date: 10/22/2020     Long-Range Goal: Disease Progression Prevention   Start Date: 10/22/2020  Expected End Date: 01/20/2021  Recent Progress: On track  Priority: High  Note:   Current Barriers:  Unable to achieve control of diabetes  Pharmacist Clinical Goal(s):  Over the next 90 days, patient will Achieve control of diabetes as evidenced by improved A1c through collaboration with PharmD and provider.   Interventions: 1:1 collaboration with Thomas Larsson, NP regarding development and update of comprehensive plan of care as evidenced by provider attestation and co-signature Inter-disciplinary care team collaboration (see longitudinal plan of care) Comprehensive medication review performed; medication list updated in electronic medical record  Type 2 Diabetes - Goal on track (progressing): Yes: Uncontrolled per last A1c but improving per continuous glucose monitor; Most recent A1c above goal of <7% per ADA  guidelines Current medications: metformin 1,000 mg by mouth twice daily with meals, dulaglutide (Trulicity) 1.5 mg subcutaneously weekly, and insulin degludec Thomas Rollins) 100 units subcutaneously once daily Intolerances: none Taking medications as directed: no, patient reports he was having hypoglycemia with Tresiba 100 units so he self decreased to 75 units daily Side effects thought to be attributed to current medication regimen: no, but patient does report decreased appetite with GLP-1 agonist Denies recent hypoglycemic/hyperglycemic symptoms Hypoglycemia prevention:  patient reports his copay will be too high Current meal patterns: not discussed today Current exercise:  yard work On Rollins statin: $RemoveB'[x]'FgAyxUth$  Yes  $Re'[]'mQi$  No    Last microalbumin: 16.1 (04/29/20); on an ACEi/ARB: $RemoveBefo'[x]'nxhNXFVEiJf$  Yes  $Re'[]'wMO$  No    Current glucose readings: see CGM report below (01/13/21-02/11/21). Major improvement in blood glucose since last visit (after switch from Levemir to [place]) with 87% of the time being in the normal range. Only 12% high and 1% hypoglycemia per CGM report.  Hypoglycemia: I have discussed with the patient how to treat hypoglycemia by the rule of 15; eat/drink 15g of sugar in the form of glucose tabs, 4 ounces of juice or soda and recheck fingerstick glucose in 15 minutes. Chocolate bars and ice cream should be avoided because fat delays carbohydrate digestion and absorption. Retreat if glucose remains low. Driving should cease until glucose is normal. Continue metformin 1,000 mg by mouth twice daily with meals and dulaglutide (Trulicity) 1.5 mg subcutaneously weekly on Tuesday Discussed with PCP Thomas Rollins) and will decrease Tresiba to 76 units (rounded to the nearest 2) once daily since patient self decreased to this and blood glucose significantly improved Continue to monitor blood glucose with Dexcom G6. Low blood glucose alarm is set for <70. High blood  glucose alarm adjusted to >250 today. Patient aware to use finger  stick blood glucose checks as needed if symptoms do not match sensor glucose reading. Will follow-up with blood glucose via Dexcom and recommend adjustments to medications as necessary.  Patient identified as Rollins good candidate for SGLT-2 inhibitor given reduction in cardiovascular disease, slowed chronic kidney disease progression, low risk of hypoglycemia, and weight loss. Patient denies Rollins history of significant genitourinary infections. No concern for hypotension/volume depletion. GFR at least >20 mL/minute/1.73 m2. Can consider discussing further at future visit.         Hypertension - condition stable: not addressed at this visit: Blood pressure under good control. Blood pressure is at goal of <130/80 mmHg per 2017 AHA/ACC guidelines. Current medications: lisinopril 10 mg by mouth once daily Intolerances: none Taking medications as directed: yes Side effects thought to be attributed to current medication regimen: no Denies dizziness, lightheadedness, blurred vision, and headache Home blood pressure readings: not discussed today Continue lisinopril 10 mg by mouth once daily Encourage dietary sodium restriction/DASH diet Recommend regular aerobic exercise  Hyperlipidemia - condition stable: not addressed at this visit: Controlled; LDL at goal of <100 due to high risk given at least 2 major CV risk factors (diabetes and hypertension) and 10-year risk 10-20% per 2020 AACE/ACE guidelines and TG above goal of <150 per 2020 AACE/ACE guidelines Current medications: simvastatin 40 mg by mouth at bedtime and fish oil 1 capsule by mouth every morning  Intolerances: none Taking medications as directed: yes Side effects thought to be attributed to current medication regimen: no Continue simvastatin 40 mg by mouth at bedtime Discussed need for and importance of continued work on weight loss Consider increasing fish oil to 2 capsules by mouth daily to slightly lower triglycerides  Patient  Goals/Self-Care Activities Over the next 90 days, patient will:  Take medications as prescribed Check blood sugar continuously with continuous glucose monitor, document, and provide at future appointments Engage in dietary modifications by fewer sweetened foods & beverages and watch portion sizes/amount of food eaten at one time  Follow Up Plan: Face to Face appointment with care management team member scheduled for: 03/13/21     Medication Assistance: None required.  Patient affirms current coverage meets needs.  Patient's preferred pharmacy is:  [facility] [place] [address] [place] [address] Phone: [phone] Fax: [phone]  [facility] [address] [place] [address] Phone: [phone] Fax: [phone]  Follow Up:  Patient agrees to Care Plan and Follow-up.  Plan: Face to Face appointment with care management team member scheduled for: 03/13/21  Kennon Holter, PharmD, [place], [place] Clinical Pharmacist Practitioner [facility] [phone]

## 2021-02-27 DIAGNOSIS — E1140 Type 2 diabetes mellitus with diabetic neuropathy, unspecified: Secondary | ICD-10-CM | POA: Diagnosis not present

## 2021-02-27 DIAGNOSIS — M4722 Other spondylosis with radiculopathy, cervical region: Secondary | ICD-10-CM | POA: Diagnosis not present

## 2021-03-01 DIAGNOSIS — E119 Type 2 diabetes mellitus without complications: Secondary | ICD-10-CM | POA: Diagnosis not present

## 2021-03-03 NOTE — Progress Notes (Deleted)
PATIENT: Thomas Rollins DOB: 06-May-1960  REASON FOR VISIT: follow up HISTORY FROM: patient  No chief complaint on file.    HISTORY OF PRESENT ILLNESS: 03/03/21 ALL: Thomas Rollins returns for follow up for OSA on CPAP.     03/04/2020 ALL: Thomas Rollins is a 61 y.o. male here today for follow up for OSA on CPAP.  He has been on CPAP therapy for years but needed new machine. Recent sleep study confirmed severe OSA. He reports new machine is working great. He denies any concerns with machine or supplies. He has noted a leak but is uncertain if it is the mask or hose. He will continue to monitor at home.  He does report that he uses CPAP at times when he is not asleep.  Compliance report dated 02/03/2020 through 03/03/2020 reveals that he used CPAP 30 of the past 30 days for compliance of 100%.  He used CPAP greater than 4 hours all 30 days.  Average usage on days used was 13 hours and 14 minutes.  Residual AHI was 1.4 on 5 to 12 cm of water and an EPR of 2.  There was a significant leak noted in the 95th percentile at 56.9 L/min.   HISTORY: (copied from Dr Dohmeier's previous note)  Thomas Rollins is a 61  Year-old  Caucasian male patient from [place] and seen upon an urgent referral on 10/23/2019 from NP Tereasa Coop, NP. I have the pleasure of seeing Thomas Rollins today, a righyt-handed White or Caucasian male who has a  has a past medical history of Anxiety, Chronic pain, Depression, Diabetes mellitus without complication (HCC), Hyperlipidemia, and Hypertension. He weighted 351 pounds when he fell and became disabled.    Chief concern according to patient : Patient has been followed by Dr.Hawkins who has retired. He claims he had no follow up since his HST 01-2019. Thomas Rollins reports that he under went a UPPP, about 25 years ago- He has been a CPAP user for over 2 decades and his machine is truly an antique, as he can show me here. He has nasal septal surgery as well.  He has been using a  nasal mask and underwent a home sleep test at any [facility] ordered by Dr. Juanetta Gosling.  Apparently this was an Allis testing device used on 12-9 2020.  The patient stated that then his doctor he was informed that the doctor who referred him had retired and nobody followed up.  His AHI was 6.4 which is a mild sleep apnea total number of events was 60 more events were noticed when he was on the right side sleeping or in supine position sleeping on his back. He uses hs machine even when he takes a nap.    He has been faithful use of CPAP mainly because he feels as if he suffocates and cannot really sleep without it.  He is routinely in need of a new machine.  I would like to add that the nadir of oxygen saturation was 73% during his home sleep test and that his heart rate varied very greatly between 51 and 255 bpm I am thinking that this may be a technical error.  He did snore loudly.    My nurse was able to get a download of the C-Flex machine that shows an average AHI of still 5.2/h with a CPAP setting of only 7 cmH2O 100% compliance.   Thomas Rollins presented to his nurse practitioner, who is now functioning  as his primary care provider recently , several days after a tick bite and he states that he was bitten by more than 2 ticks and that he developed chills, low-grade fever and fatigue.  Mild aches all over.  He did not present with symptoms concerning for COVID-19 infection.  He is fully vaccinated.   Family medical /sleep history: he lost his 5 year old daughter due to cancer 3 years ago, a second daughter also has cancer.   Social history: he went to school until 44 th garde. Was a single parent , raised his second daughter alone. 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 and lives alone. Tobacco use never .Marihuana : not since spring of this year-   ETOH use seldomly , he used to take xanax and sleeping pills.  Caffeine  intake in form of Coffee( /) Soda( /) Tea ( /) or energy drinks. Chocolate milk shake 3-4 times a week.  Regular exercise - none      Sleep habits are as follows: The patient's dinner time is between 4.30 PM. The patient goes to bed at 11 PM and continues to sleep for 2-3 hours, wakes for one of two bathroom breaks.   The preferred sleep position is left, with the support of 1 pillows.  Dreams are reportedly  Frequent/vivid. Nightmares. Wakes with palpitations.  7 AM is the usual rise time. The patient wakes up spontaneously at 6.30AM.   He reports not feeling refreshed or restored in AM, with symptoms such as dry mouth,Naps are avoided.     REVIEW OF SYSTEMS: Out of a complete 14 system review of symptoms, the patient complains only of the following symptoms, depression, chronic pain and all other reviewed systems are negative.  ESS: 11 FSS: 24  ALLERGIES: Allergies  Allergen Reactions   Codeine Other (See Comments)    Slurred Speech    Other     [place] - N/V   sweat potatoes - N/V    Penicillins Rash    HOME MEDICATIONS: Outpatient Medications Prior to Visit  Medication Sig Dispense Refill   albuterol (VENTOLIN HFA) 108 (90 Base) MCG/ACT inhaler Inhale 2 puffs into the lungs every 6 (six) hours as needed for wheezing or shortness of breath. 8 g 0   ALPHAGAN P 0.1 % SOLN Place 1 drop into both eyes 2 (two) times daily as needed (irritation).     ALPRAZolam (XANAX) 0.5 MG tablet Take 1 tablet (0.5 mg total) by mouth 2 (two) times daily as needed for anxiety. 14 tablet 0   AMBULATORY NON FORMULARY MEDICATION 1 vial by Intracavernosal route as needed. Medication Name:  Prostaglandin E1, 10 mcg vials.  Dispense 5 with 11 refills  Inject 1 vial as needed 5 vial 11   blood glucose meter kit and supplies Dispense based on patient and insurance preference. Use up to four times daily as directed. (FOR ICD-10 E10.9, E11.9). 1 each 0   Continuous Blood Gluc Sensor (DEXCOM G6 SENSOR) MISC  Change sensor every 10 days 3 each 11   cyclobenzaprine (FLEXERIL) 10 MG tablet Take 10 mg by mouth 3 (three) times daily as needed for muscle spasms.     Diclofenac Sodium 3 % GEL Apply 1 g topically 4 (four) times daily as needed (pain).     Dulaglutide (TRULICITY) 1.5 PP/5.0DT SOPN Inject 1.5 mg into the skin once a week. 6 mL 1  DULoxetine (CYMBALTA) 60 MG capsule TAKE 1 CAPSULE BY MOUTH EVERY DAY 90 capsule 2   gabapentin (NEURONTIN) 300 MG capsule TAKE ONE CAPSULE BY MOUTH TWICE DAILY (Patient taking differently: Take 300 mg by mouth daily.) 60 capsule 10   GLOBAL EASE INJECT PEN NEEDLES 31G X 8 MM MISC Use to inject insulin once daily 100 each 3   ibuprofen (ADVIL) 200 MG tablet Take 400 mg by mouth every 6 (six) hours as needed for moderate pain or headache.     insulin degludec (TRESIBA FLEXTOUCH) 200 UNIT/ML FlexTouch Pen Inject 76 Units into the skin daily.     levothyroxine (SYNTHROID) 137 MCG tablet Take 1 tablet (137 mcg total) by mouth every morning. on an empty stomach 30 tablet 2   lisinopril (ZESTRIL) 10 MG tablet TAKE 1 TABLET BY MOUTH DAILY 30 tablet 10   meloxicam (MOBIC) 7.5 MG tablet TAKE 1 TABLET BY MOUTH DAILY 30 tablet 10   metFORMIN (GLUCOPHAGE) 1000 MG tablet TAKE 1 TABLET BY MOUTH TWICE DAILY 180 tablet 1   metoCLOPramide (REGLAN) 5 MG tablet Take 1 tablet (5 mg total) by mouth every 8 (eight) hours as needed for nausea or vomiting. 20 tablet 0   morphine (MS CONTIN) 30 MG 12 hr tablet Take 1 tablet by mouth 2 (two) times daily. Take with the $Remove'60mg'[id number]$       morphine (MS CONTIN) 60 MG 12 hr tablet Take 1 tablet by mouth 2 (two) times daily. Takes with the $Remove'30mg'UuqbxpV$       naloxone (NARCAN) nasal spray 4 mg/0.1 mL Place 1 spray into the nose as needed (opioid overdose).     Omega-3 Fatty Acids (FISH OIL) 1000 MG CAPS Take 2 capsules (2,000 mg total) by mouth in the morning and at bedtime. (Patient taking differently: Take 1 capsule by mouth every morning.) 360 capsule 3   omeprazole  (PRILOSEC) 40 MG capsule Take 1 capsule (40 mg total) by mouth daily. 30 capsule 3   ONETOUCH ULTRA test strip USE UP TO FOUR TIMES DAILY AS DIRECTED (25 DAY supply) 100 strip 11   simvastatin (ZOCOR) 40 MG tablet TAKE 1 TABLET BY MOUTH EVERY EVENING 90 tablet 1   traZODone (DESYREL) 50 MG tablet TAKE ONE-HALF TO 1 TABLET BY MOUTH AT BEDTIME AS NEEDED FOR SLEEP 30 tablet 3   No facility-administered medications prior to visit.    PAST MEDICAL HISTORY: Past Medical History:  Diagnosis Date   Abnormal LFTs    Anxiety    panic attacks   Chronic pain    Depression    Diabetes mellitus without complication ([place])    Encounter for hepatitis C screening test for low risk patient 04/05/2019   Hyperlipidemia    Hypertension    Hypothyroidism    Sleep apnea    TIA (transient ischemic attack) 2019    PAST SURGICAL HISTORY: Past Surgical History:  Procedure Laterality Date   ankle sx     ARTHROSCOPIC REPAIR ACL     CARPAL TUNNEL RELEASE     COLONOSCOPY WITH PROPOFOL N/A 02/03/2021   Procedure: COLONOSCOPY WITH PROPOFOL;  Surgeon: Eloise Harman, DO;  Location: [facility];  Service: Endoscopy;  Laterality: N/A;  8:00am   HERNIA REPAIR     SHOULDER ARTHROSCOPY      FAMILY HISTORY: Family History  Problem Relation Age of Onset   Stomach cancer Mother 78   Heart Problems Father    Dementia Father    Cancer Father        "  tumors"   Bone cancer Brother    Lung cancer Brother    Cancer Daughter 38       Male cancer   Cancer Daughter 50       Male   Colon cancer Neg Hx     SOCIAL HISTORY: Social History   Socioeconomic History   Marital status: Widowed    Spouse name: Not on file   Number of children: 1   Years of education: Not on file   Highest education level: 12th grade  Occupational History   Not on file  Tobacco Use   Smoking status: Former    Types: Cigarettes   Smokeless tobacco: Never  Vaping Use   Vaping Use: Never used  Substance and Sexual  Activity   Alcohol use: Not Currently    Comment: none in 1 yr 10/2020   Drug use: Yes    Types: Marijuana    Comment: occas   Sexual activity: Never  Other Topics Concern   Not on file  Social History Narrative   Lives with good friend of his, known for 26 years    Only daughter passed away from cancer in 11/20/2016       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   Takes time release morphine b/c fell 15-20 feet from a building while working.    Now disabled        Exercises/walks: 7 days a week   Has pets at home: 5 dogs in the home.      Diet: Avoids white foods, fruits, veggies, does eat some fast food/eat out    Caffeine: none only hot chocolate or soda when stomach is upset   Water: 8 cups daily if not more      Wears seat belt   Smoke detectors and carbon monoxide detectors at home    Does not use phone while driving                      Social Determinants of Radio broadcast assistant Strain: Low Risk    Difficulty of Paying Living Expenses: Not very hard  Food Insecurity: No Food Insecurity   Worried About Charity fundraiser in the Last Year: Never true   Ran Out of Food in the Last Year: Never true  Transportation Needs: No Transportation Needs   Lack of Transportation (Medical): No   Lack of Transportation (Non-Medical): No  Physical Activity: Sufficiently Active   Days of Exercise per Week: 4 days   Minutes of Exercise per Session: 40 min  Stress: Stress Concern Present   Feeling of Stress : To some extent  Social Connections: Socially Isolated   Frequency of Communication with Friends and Family: Twice a week   Frequency of Social Gatherings with Friends and Family: Never   Attends Religious Services: Never   Printmaker: No   Attends Music therapist: Never   Marital Status: Divorced  Human resources officer Violence: Not At Risk   Fear of Current or Ex-Partner: No   Emotionally Abused: No   Physically Abused: No    Sexually Abused: No     PHYSICAL EXAM  There were no vitals filed for this visit.  There is no height or weight on file to calculate BMI.  Generalized: Well developed, in no acute distress  Cardiology: normal rate and rhythm, no murmur noted Respiratory: clear to auscultation bilaterally  Neurological examination  Mentation: Alert oriented to  time, place, history taking. Follows all commands speech and language fluent Cranial nerve II-XII: Pupils were equal round reactive to light. Extraocular movements were full, visual field were full  Motor: The motor testing reveals 5 over 5 strength of all 4 extremities. Good symmetric motor tone is noted throughout.  Gait and station: Gait is normal.    DIAGNOSTIC DATA (LABS, IMAGING, TESTING) - I reviewed patient records, labs, notes, testing and imaging myself where available.  No flowsheet data found.   Lab Results  Component Value Date   WBC 7.1 12/03/2020   HGB 15.5 12/03/2020   HCT 46.9 12/03/2020   MCV 87 12/03/2020   PLT 206 12/03/2020      Component Value Date/Time   NA 135 01/31/2021 1143   NA 134 12/03/2020 1026   K 4.3 01/31/2021 1143   CL 101 01/31/2021 1143   CO2 26 01/31/2021 1143   GLUCOSE 147 (H) 01/31/2021 1143   BUN 25 (H) 01/31/2021 1143   BUN 23 12/03/2020 1026   CREATININE 0.77 01/31/2021 1143   CREATININE 0.72 02/05/2017 1101   CALCIUM 9.4 01/31/2021 1143   PROT 6.8 12/03/2020 1026   ALBUMIN 4.6 12/03/2020 1026   AST 28 12/03/2020 1026   ALT 43 12/03/2020 1026   ALKPHOS 83 12/03/2020 1026   BILITOT 0.5 12/03/2020 1026   GFRNONAA >60 01/31/2021 1143   GFRAA 114 03/27/2020 1503   Lab Results  Component Value Date   CHOL 138 12/03/2020   HDL 36 (L) 12/03/2020   LDLCALC 70 12/03/2020   TRIG 193 (H) 12/03/2020   CHOLHDL 3.7 02/05/2017   Lab Results  Component Value Date   HGBA1C 9.7 (H) 12/03/2020   No results found for: [id number] Lab Results  Component Value Date   TSH 7.980 (H)  12/03/2020     ASSESSMENT AND PLAN 61 y.o. year old male  has a past medical history of Abnormal LFTs, Anxiety, Chronic pain, Depression, Diabetes mellitus without complication ([place]), Encounter for hepatitis C screening test for low risk patient (04/05/2019), Hyperlipidemia, Hypertension, Hypothyroidism, Sleep apnea, and TIA (transient ischemic attack) (2019). here with     ICD-10-CM   1. OSA on CPAP  G47.33    Z99.89         JAYSE HODKINSON is doing well on CPAP therapy. Compliance report reveals excellent compliance.  A large leak was noted on current report.  He will monitor for correct mask seal at home.  He is currently using a nasal mask.  He will reach out to his DME company if leak continues.  May consider mask refitting.  He was encouraged to continue using CPAP nightly and for greater than 4 hours each night. We will update supply orders as indicated. Risks of untreated sleep apnea review and education materials provided. Healthy lifestyle habits encouraged. He will follow up in 1 year, sooner if needed. He verbalizes understanding and agreement with this plan.    No orders of the defined types were placed in this encounter.    No orders of the defined types were placed in this encounter.     Debbora Presto, FNP-C 03/03/2021, 4:19 PM [facility] [address], [place], [place] [address] [phone]

## 2021-03-03 NOTE — Patient Instructions (Incomplete)

## 2021-03-04 ENCOUNTER — Ambulatory Visit: Payer: Medicare HMO | Admitting: Family Medicine

## 2021-03-04 ENCOUNTER — Encounter: Payer: Self-pay | Admitting: Family Medicine

## 2021-03-04 ENCOUNTER — Other Ambulatory Visit: Payer: Self-pay | Admitting: Internal Medicine

## 2021-03-04 DIAGNOSIS — G4733 Obstructive sleep apnea (adult) (pediatric): Secondary | ICD-10-CM

## 2021-03-04 DIAGNOSIS — E039 Hypothyroidism, unspecified: Secondary | ICD-10-CM

## 2021-03-13 ENCOUNTER — Other Ambulatory Visit: Payer: Self-pay

## 2021-03-13 ENCOUNTER — Ambulatory Visit (INDEPENDENT_AMBULATORY_CARE_PROVIDER_SITE_OTHER): Payer: Medicare HMO | Admitting: Pharmacist

## 2021-03-13 VITALS — Wt 239.0 lb

## 2021-03-13 DIAGNOSIS — I10 Essential (primary) hypertension: Secondary | ICD-10-CM

## 2021-03-13 DIAGNOSIS — E119 Type 2 diabetes mellitus without complications: Secondary | ICD-10-CM

## 2021-03-13 DIAGNOSIS — Z794 Long term (current) use of insulin: Secondary | ICD-10-CM

## 2021-03-13 DIAGNOSIS — E1169 Type 2 diabetes mellitus with other specified complication: Secondary | ICD-10-CM

## 2021-03-13 MED ORDER — TRESIBA FLEXTOUCH 200 UNIT/ML ~~LOC~~ SOPN: 70.0000 [IU] | PEN_INJECTOR | Freq: Every day | SUBCUTANEOUS | Status: DC

## 2021-03-13 NOTE — Chronic Care Management (AMB) (Signed)
Chronic Care Management Pharmacy Note  03/13/2021 Name:  Thomas Rollins MRN:  [id number] DOB:  1960/07/10  Summary: Type 2 Diabetes Uncontrolled per last A1c but improving per continuous glucose monitor Current medications: metformin 1,000 mg by mouth twice daily with meals, dulaglutide (Trulicity) 1.5 mg subcutaneously weekly, and insulin degludec [name]) 76 units subcutaneously once daily Reports a couple minor hypoglycemic events which were validated on continuous glucose monitor  Current glucose readings: see CGM report below (02/12/21-03/13/21). Continued improvement in blood glucose with 95% of the time being in the normal range. Only 3% high and 2% low per CGM report.  Continue metformin 1,000 mg by mouth twice daily with meals and dulaglutide (Trulicity) 1.5 mg subcutaneously weekly on Tuesday Discussed with PCP and will decrease Tresiba to 70 units due to a few minor hypoglycemia events recently Check A1c in 2 months prior to next visit. Anticipate large improvement.  Continue to monitor blood glucose with Dexcom G6. Low blood glucose alarm is set for <70. High blood glucose alarm set to >250 today. Patient aware to use finger stick blood glucose checks as needed if symptoms do not match sensor glucose reading. Will follow-up with blood glucose via Dexcom and recommend adjustments to medications as necessary.      Subjective: Thomas Rollins is an 61 y.o. year old male who is a primary patient of Renee Rival, FNP.  The CCM team was consulted for assistance with disease management and care coordination needs.    Engaged with patient face to face for follow up visit in response to provider referral for pharmacy case management and/or care coordination services.   Consent to Services:  The patient was given information about Chronic Care Management services, agreed to services, and gave verbal consent prior to initiation of services.  Please see initial visit note for  detailed documentation.   Patient Care Team: Renee Rival, FNP as PCP - General (Nurse Practitioner) Caren Macadam, MD as PCP - Family Medicine (Family Medicine) Eloise Harman, DO as Consulting Physician (Gastroenterology) Beryle Lathe, [facility] (Pharmacist)  Objective:  Lab Results  Component Value Date   CREATININE 0.77 01/31/2021   CREATININE 0.89 12/03/2020   CREATININE 0.84 08/02/2020    Lab Results  Component Value Date   HGBA1C 9.7 (H) 12/03/2020   Last diabetic Eye exam: No results found for: HMDIABEYEEXA  Last diabetic Foot exam: No results found for: HMDIABFOOTEX      Component Value Date/Time   CHOL 138 12/03/2020 1026   TRIG 193 (H) 12/03/2020 1026   HDL 36 (L) 12/03/2020 1026   CHOLHDL 3.7 02/05/2017 1101   LDLCALC 70 12/03/2020 1026   LDLCALC 87 02/05/2017 1101    Hepatic Function Latest Ref Rng & Units 12/03/2020 12/03/2020 08/02/2020  Total Protein 6.0 - 8.5 g/dL 6.8 6.6 7.3  Albumin 3.8 - 4.8 g/dL 4.6 4.7 5.1(H)  AST 0 - 40 IU/L 28 28 46(H)  ALT 0 - 44 IU/L 43 44 72(H)  Alk Phosphatase 44 - 121 IU/L 83 79 66  Total Bilirubin 0.0 - 1.2 mg/dL 0.5 0.4 0.6  Bilirubin, Direct 0.00 - 0.40 mg/dL - 0.13 -    Lab Results  Component Value Date/Time   TSH 7.980 (H) 12/03/2020 10:26 AM   TSH 2.190 08/02/2020 10:56 AM   FREET4 0.92 12/03/2020 10:26 AM   FREET4 1.35 08/02/2020 10:56 AM    CBC Latest Ref Rng & Units 12/03/2020 08/02/2020 04/29/2020  WBC 3.4 - 10.8 x10E3/uL  7.1 7.5 6.1  Hemoglobin 13.0 - 17.7 g/dL 15.5 15.4 14.9  Hematocrit 37.5 - 51.0 % 46.9 46.5 45.4  Platelets 150 - 450 x10E3/uL 206 206 184    Lab Results  Component Value Date/Time   VD25OH 33.1 01/31/2020 11:28 AM    Clinical ASCVD: No  The 10-year ASCVD risk score (Arnett DK, et al., 2019) is: 18.8%   Values used to calculate the score:     Age: 61 years     Sex: Male     Is Non-Hispanic African American: No     Diabetic: Yes     Tobacco smoker: No      Systolic Blood Pressure: 301 mmHg     Is BP treated: Yes     HDL Cholesterol: 36 mg/dL     Total Cholesterol: 138 mg/dL    Social History   Tobacco Use  Smoking Status Former   Types: Cigarettes  Smokeless Tobacco Never   BP Readings from Last 3 Encounters:  02/10/21 124/77  02/03/21 134/68  01/31/21 126/87   Pulse Readings from Last 3 Encounters:  02/10/21 79  02/03/21 68  01/31/21 100   Wt Readings from Last 3 Encounters:  03/13/21 239 lb (108.4 kg)  02/10/21 236 lb 0.6 oz (107.1 kg)  02/03/21 230 lb (104.3 kg)    Assessment: Review of patient past medical history, allergies, medications, health status, including review of consultants reports, laboratory and other test data, was performed as part of comprehensive evaluation and provision of chronic care management services.   SDOH:  (Social Determinants of Health) assessments and interventions performed:    CCM Care Plan  Allergies  Allergen Reactions   Codeine Other (See Comments)    Slurred Speech    Other     [place] - N/V   sweat potatoes - N/V    Penicillins Rash    Medications Reviewed Today     Reviewed by Beryle Lathe, [facility] (Pharmacist) on 03/13/21 at 1023  Med List Status: <None>   Medication Order Taking? Sig Documenting Provider Last Dose Status Informant  albuterol (VENTOLIN HFA) 108 (90 Base) MCG/ACT inhaler [id number] Yes Inhale 2 puffs into the lungs every 6 (six) hours as needed for wheezing or shortness of breath. Noreene Larsson, NP Taking Active Self  ALPHAGAN P 0.1 % SOLN [id number] Yes Place 1 drop into both eyes 2 (two) times daily as needed (irritation). [provider] Taking Active Self           Med Note Jilda Roche A   Tue Dec 17, 2020  2:48 PM)    ALPRAZolam Duanne Moron) 0.5 MG tablet [id number] Yes Take 1 tablet (0.5 mg total) by mouth 2 (two) times daily as needed for anxiety. Noreene Larsson, NP Taking Active Self  AMBULATORY NON FORMULARY MEDICATION [id number]  1 vial by  Intracavernosal route as needed. Medication Name:  Prostaglandin E1, 10 mcg vials.  Dispense 5 with 11 refills  Inject 1 vial as needed Franchot Gallo, MD  Active Self  blood glucose meter kit and supplies [id number] Yes Dispense based on patient and insurance preference. Use up to four times daily as directed. (FOR ICD-10 E10.9, E11.9). Noreene Larsson, NP Taking Active Self  Continuous Blood Gluc Sensor (DEXCOM G6 SENSOR) MISC [id number] Yes Change sensor every 10 days Noreene Larsson, NP Taking Active Self  cyclobenzaprine (FLEXERIL) 10 MG tablet [id number] Yes Take 10 mg by mouth 3 (three) times daily as needed for muscle spasms. [provider] Taking Active Self  Diclofenac Sodium 3 % GEL [id number] Yes Apply 1 g topically 4 (four) times daily as needed (pain). [provider] Taking Active Self  Dulaglutide (TRULICITY) 1.5 FX/5.8IT SOPN [id number] Yes Inject 1.5 mg into the skin once a week. Noreene Larsson, NP Taking Active Self           Med Note Jilda Roche A   Tue Dec 17, 2020  2:45 PM) Tuesdays  DULoxetine (CYMBALTA) 60 MG capsule [id number] Yes TAKE 1 CAPSULE BY MOUTH EVERY DAY Lindell Spar, MD Taking Active Self  gabapentin (NEURONTIN) 300 MG capsule [id number] Yes TAKE ONE CAPSULE BY MOUTH TWICE DAILY  Patient taking differently: Take 300 mg by mouth daily.   Noreene Larsson, NP Taking Active   GLOBAL EASE INJECT PEN NEEDLES 31G X 8 MM MISC [id number]  Use to inject insulin once daily Noreene Larsson, NP  Active   ibuprofen (ADVIL) 200 MG tablet [id number] Yes Take 400 mg by mouth every 6 (six) hours as needed for moderate pain or headache. [provider] Taking Active Self  insulin degludec (TRESIBA FLEXTOUCH) 200 UNIT/ML FlexTouch Pen [id number] Yes Inject 76 Units into the skin daily. Noreene Larsson, NP Taking Active   levothyroxine (SYNTHROID) 137 MCG tablet [id number] Yes TAKE 1 TABLET BY MOUTH every morning ON an EMPTY stomach Lindell Spar, MD Taking  Active   lisinopril (ZESTRIL) 10 MG tablet [id number] Yes TAKE 1 TABLET BY MOUTH DAILY Noreene Larsson, NP Taking Active Self  meloxicam (MOBIC) 7.5 MG tablet [id number] Yes TAKE 1 TABLET BY MOUTH DAILY Noreene Larsson, NP Taking Active Self  metFORMIN (GLUCOPHAGE) 1000 MG tablet [id number] Yes TAKE 1 TABLET BY MOUTH TWICE DAILY Noreene Larsson, NP Taking Active   metoCLOPramide (REGLAN) 5 MG tablet [id number] Yes Take 1 tablet (5 mg total) by mouth every 8 (eight) hours as needed for nausea or vomiting. Lindell Spar, MD Taking Active   morphine (MS CONTIN) 30 MG 12 hr tablet [id number] Yes Take 1 tablet by mouth 2 (two) times daily. Take with the 87m  [provider] Taking Active Self  morphine (MS CONTIN) 60 MG 12 hr tablet [id number] Take 1 tablet by mouth 2 (two) times daily. Takes with the 357m [provider] Unknown Active Self  naloxone (NARCAN) nasal spray 4 mg/0.1 mL [id number] Place 1 spray into the nose as needed (opioid overdose). [provider] Unknown Active Self  Omega-3 Fatty Acids (FISH OIL) 1000 MG CAPS [id number] Take 2 capsules (2,000 mg total) by mouth in the morning and at bedtime.  Patient taking differently: Take 1 capsule by mouth every morning.   GrNoreene LarssonNP Taking Active   omeprazole (PRILOSEC) 40 MG capsule [id number] Take 1 capsule (40 mg total) by mouth daily. PaLindell SparMD Taking Active   [facility] test strip [id number] UP TO FOUR TIMES DAILY AS DIRECTED (25 DAY supply) GrNoreene LarssonNP  Active Self  simvastatin (ZOCOR) 40 MG tablet [id number] TAKE 1 TABLET BY MOUTH EVERY EVENING GrNoreene LarssonNP Taking Active   traZODone (DESYREL) 50 MG tablet [id number] TAKE ONE-HALF TO 1 TABLET BY MOUTH AT BEDTIME AS NEEDED FOR SLEEP PaLindell SparMD Taking Active             Patient Active Problem List   Diagnosis Date Noted   Abnormal LFTs    Encounter for  screening colonoscopy    Erectile dysfunction  08/01/2020   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 03/27/2020   OSA on CPAP 01/31/2020   CPAP (continuous positive airway pressure) dependence 10/23/2019   Opioid contract exists 10/23/2019   Edentulous 10/23/2019   Insomnia, unspecified 10/23/2019   Body mass index (BMI) 37.0-37.9, adult 09/12/2019   Elevated blood-pressure reading, without diagnosis of hypertension 09/12/2019   Anxiety 04/12/2019   Panic attacks 04/12/2019   Vitamin D deficiency 04/11/2019   Hypothyroidism 04/11/2019   Diabetes mellitus type 2, insulin dependent ([place]) 02/05/2017   Depression, recurrent ([place]) 02/05/2017   Hyperlipidemia associated with type 2 diabetes mellitus ([place]) 02/05/2017   Essential hypertension 02/05/2017    Immunization History  Administered Date(s) Administered   Influenza,inj,quad, With Preservative 12/16/2018   Moderna SARS-COV2 Booster Vaccination 04/30/2020   Moderna Sars-Covid-2 Vaccination 09/20/2019, 10/18/2019    Conditions to be addressed/monitored: HTN, HLD, and DMII  Care Plan : Medication Management  Updates made by Beryle Lathe, [place] since 03/13/2021 12:00 AM     Problem: T2DM, HTN, HLD   Priority: High  Onset Date: 10/22/2020     Long-Range Goal: Disease Progression Prevention   Start Date: 10/22/2020  Expected End Date: 01/20/2021  Recent Progress: On track  Priority: High  Note:   Current Barriers:  Unable to achieve control of diabetes  Pharmacist Clinical Goal(s):  Over the next 90 days, patient will Achieve control of diabetes as evidenced by improved A1c through collaboration with PharmD and provider.   Interventions: 1:1 collaboration with Renee Rival, FNP regarding development and update of comprehensive plan of care as evidenced by provider attestation and co-signature Inter-disciplinary care team collaboration (see longitudinal plan of care) Comprehensive medication review performed; medication list updated in electronic medical  record  Type 2 Diabetes - Goal on track (progressing): Yes: Uncontrolled per last A1c but improving per continuous glucose monitor; Most recent A1c above goal of <7% per ADA guidelines Current medications: metformin 1,000 mg by mouth twice daily with meals, dulaglutide (Trulicity) 1.5 mg subcutaneously weekly, and insulin degludec [name]) 76 units subcutaneously once daily Intolerances: none Taking medications as directed: yes Side effects thought to be attributed to current medication regimen: no, but patient does report decreased appetite with GLP-1 agonist Reports a couple minor hypoglycemic events which were validated on continuous glucose monitor  Hypoglycemia prevention:  patient reports his copay will be too high Current meal patterns: not discussed today Current exercise:  yard work On a statin: _0  Yes  _1  No    Last microalbumin: 16.1 (04/29/20); on an ACEi/ARB: _2  Yes  _3  No    Current glucose readings: see CGM report below (02/12/21-03/13/21). Continued improvement in blood glucose with 95% of the time being in the normal range. Only 3% high and 2% low per CGM report.  Hypoglycemia: I have discussed with the patient how to treat hypoglycemia by the rule of 15; eat/drink 15g of sugar in the form of glucose tabs, 4 ounces of juice or soda and recheck fingerstick glucose in 15 minutes. Chocolate bars and ice cream should be avoided because fat delays carbohydrate digestion and absorption. Retreat if glucose remains low. Driving should cease until glucose is normal. Continue metformin 1,000 mg by mouth twice daily with meals and dulaglutide (Trulicity) 1.5 mg subcutaneously weekly on Tuesday Discussed with PCP and will decrease Tresiba to 70 units due to a few minor hypoglycemia events recently Check A1c in 2 months prior to next visit. Anticipate large improvement.  Continue to  monitor blood glucose with Dexcom G6. Low blood glucose alarm is set for <70. High blood glucose alarm set to  >250 today. Patient aware to use finger stick blood glucose checks as needed if symptoms do not match sensor glucose reading. Will follow-up with blood glucose via Dexcom and recommend adjustments to medications as necessary.  Patient identified as a good candidate for SGLT-2 inhibitor given reduction in cardiovascular disease, slowed chronic kidney disease progression, low risk of hypoglycemia, and weight loss. Patient denies a history of significant genitourinary infections. No concern for hypotension/volume depletion. GFR at least >20 mL/minute/1.73 m2. Can consider discussing further at future visit.      Hypertension - condition stable: not addressed at this visit: Blood pressure under good control. Blood pressure is at goal of <130/80 mmHg per 2017 AHA/ACC guidelines. Current medications: lisinopril 10 mg by mouth once daily Intolerances: none Taking medications as directed: yes Side effects thought to be attributed to current medication regimen: no Denies dizziness, lightheadedness, blurred vision, and headache Home blood pressure readings: not discussed today Continue lisinopril 10 mg by mouth once daily Encourage dietary sodium restriction/DASH diet Recommend regular aerobic exercise  Hyperlipidemia - condition stable: not addressed at this visit: Controlled; LDL at goal of <100 due to high risk given at least 2 major CV risk factors (diabetes and hypertension) and 10-year risk 10-20% per 2020 AACE/ACE guidelines and TG above goal of <150 per 2020 AACE/ACE guidelines Current medications: simvastatin 40 mg by mouth at bedtime and fish oil 1 capsule by mouth every morning  Intolerances: none Taking medications as directed: yes Side effects thought to be attributed to current medication regimen: no Continue simvastatin 40 mg by mouth at bedtime Discussed need for and importance of continued work on weight loss Consider increasing fish oil to 2 capsules by mouth daily to slightly lower  triglycerides  Patient Goals/Self-Care Activities Over the next 90 days, patient will:  Take medications as prescribed Check blood sugar continuously with continuous glucose monitor, document, and provide at future appointments Engage in dietary modifications by fewer sweetened foods & beverages and watch portion sizes/amount of food eaten at one time  Follow Up Plan: Face to Face appointment with care management team member scheduled for: 05/13/21     Medication Assistance: None required.  Patient affirms current coverage meets needs.  Patient's preferred pharmacy is:  [facility], [place] [address] [place] [address] Phone: [phone] Fax: [phone]  [facility] [address] [place] [address] Phone: [phone] Fax: [phone]  Follow Up:  Patient agrees to Care Plan and Follow-up.  Plan: Face to Face appointment with care management team member scheduled for: 05/13/21  Kennon Holter, PharmD, [place], CPP Clinical Pharmacist Practitioner [facility] [phone]

## 2021-03-13 NOTE — Patient Instructions (Signed)
BEHR CISLO,  It was great to talk to you today!  Please call me with any questions or concerns.  Visit Information  Following are the goals we discussed today:   Goals Addressed             This Visit's Progress    Medication Management       Patient Goals/Self-Care Activities Over the next 90 days, patient will:  Take medications as prescribed Check blood sugar continuously with continuous glucose monitor, document, and provide at future appointments Engage in dietary modifications by fewer sweetened foods & beverages and watch portion sizes/amount of food eaten at one time              Follow-up plan: Face to Face appointment with care management team member scheduled for: 05/13/21  Patient verbalizes understanding of instructions and care plan provided today and agrees to view in MyChart. Active MyChart status confirmed with patient.    Please call the care guide team at [phone] if you need to cancel or reschedule your appointment.   Domenic Moras, PharmD, [place], CPP Clinical Pharmacist Practitioner [facility] [phone]

## 2021-03-25 DIAGNOSIS — Z794 Long term (current) use of insulin: Secondary | ICD-10-CM | POA: Diagnosis not present

## 2021-03-25 DIAGNOSIS — Z7984 Long term (current) use of oral hypoglycemic drugs: Secondary | ICD-10-CM | POA: Diagnosis not present

## 2021-03-25 DIAGNOSIS — E1169 Type 2 diabetes mellitus with other specified complication: Secondary | ICD-10-CM

## 2021-03-25 DIAGNOSIS — I10 Essential (primary) hypertension: Secondary | ICD-10-CM

## 2021-03-25 DIAGNOSIS — E785 Hyperlipidemia, unspecified: Secondary | ICD-10-CM

## 2021-04-01 DIAGNOSIS — E119 Type 2 diabetes mellitus without complications: Secondary | ICD-10-CM | POA: Diagnosis not present

## 2021-04-09 ENCOUNTER — Other Ambulatory Visit: Payer: Self-pay | Admitting: Internal Medicine

## 2021-04-09 DIAGNOSIS — Z794 Long term (current) use of insulin: Secondary | ICD-10-CM

## 2021-04-09 DIAGNOSIS — E119 Type 2 diabetes mellitus without complications: Secondary | ICD-10-CM

## 2021-04-10 ENCOUNTER — Telehealth: Payer: Medicare HMO | Admitting: Internal Medicine

## 2021-04-11 ENCOUNTER — Encounter: Payer: Self-pay | Admitting: Nurse Practitioner

## 2021-04-11 ENCOUNTER — Ambulatory Visit (INDEPENDENT_AMBULATORY_CARE_PROVIDER_SITE_OTHER): Payer: Medicare HMO | Admitting: Nurse Practitioner

## 2021-04-11 ENCOUNTER — Other Ambulatory Visit: Payer: Self-pay

## 2021-04-11 DIAGNOSIS — R112 Nausea with vomiting, unspecified: Secondary | ICD-10-CM | POA: Diagnosis not present

## 2021-04-11 DIAGNOSIS — E119 Type 2 diabetes mellitus without complications: Secondary | ICD-10-CM | POA: Diagnosis not present

## 2021-04-11 DIAGNOSIS — Z794 Long term (current) use of insulin: Secondary | ICD-10-CM

## 2021-04-11 NOTE — Assessment & Plan Note (Signed)
Most likely due to taking trulicity 1.5mg   once a week injection              Pt not willing to continue on trulicity, pt educated on the benefits of continuing med he verbalized         understanding.       Pt told to drink plenty of water to stay hydrated.  Continue tresiba 100 units nightly if he stops taking trulicity, continue metformin 1000mg  BID , follow up in March as planned

## 2021-04-11 NOTE — Progress Notes (Signed)
Virtual Visit via Telephone Note  I connected with Thomas Rollins @ on 04/11/21 at 1151by telephone and verified that I am speaking with the correct person using two identifiers. I spent 9 minutes talking to the pt. On the telephone  Location: Patient: home Provider: office   I discussed the limitations, risks, security and privacy concerns of performing an evaluation and management service by telephone and the availability of in person appointments. I also discussed with the patient that there may be a patient responsible charge related to this service. The patient expressed understanding and agreed to proceed.   History of Present Illness: Pt c/o nausea and vomiting that began since he started taking the once a week injection for DM, he stated that my stomach has been killing me, last time he took 75 units of Tresiba insulin his sugar went down to 40. Now he checks his sugar before taking the insulin, he stated that he is doing fine with his sugar. He has been loosing weight but he can not keep anything down due to feeling sick, not having appetite. Has nausea right after eating after a while he vomits everything. He has drinking a lot of water. He wants to stop taking trulicity. He still has the Dexcom G 6. His sugar is 136 at the moment , took 50 units of tresiba last night, he has been hot dogs , pizza. take   Observations/Objective:   Assessment and Plan:  Nausea. Most likely due to taking trulicity 1.5mg  injection, he has been taking reglan for nausea and eating small amount of food at a time but no improvement in his symptoms                 Pt not willing to continue on trulicity, pt educated on the benefits of continuing med he verbalized           understanding.       Pt told to drink plenty of water to stay hydrated. Continue tresiba at 100 units nightly if he stops taking trulicity, continue metformin 1000mg  BID , follow up in March as planned   T2DM. Pt taking tresiba 75mg  at  bedtime, metformin 1000mg  BID, trulicity 1.5mg  once weekly injection.  He wants to stop taking trulicity due to having nausea and vomiting. pt educated on the benefits of continuing med  including weight loss and kidney protection, he verbalized understanding      Pt told to  Continue tresiba at 100 units nightly if he stops taking trulicity because his sugar readings will most likely go up,  continue metformin 1000mg  BID , follow up in March as planned. Avoid sugar, sweets   Follow Up Instructions:    I discussed the assessment and treatment plan with the patient. The patient was provided an opportunity to ask questions and all were answered. The patient agreed with the plan and demonstrated an understanding of the instructions.   The patient was advised to call back or seek an in-person evaluation if the symptoms worsen or if the condition fails to improve as anticipated.

## 2021-04-11 NOTE — Assessment & Plan Note (Signed)
Pt taking tresiba 75mg  at bedtime, metformin 1000mg  BID, trulicity 1.5mg  once weekly injection.  He wants to stop taking trulicity due to having nausea and vomiting. pt educated on the benefits of continuing med  including weight loss and kidney protection, he verbalized understanding      Pt told to  Continue tresiba at 100 units nightly if he stops taking trulicity because his sugar readings will most likely go up,  continue metformin 1000mg  BID , follow up in March as planned. Avoid sugar, sweets

## 2021-04-24 DIAGNOSIS — E1140 Type 2 diabetes mellitus with diabetic neuropathy, unspecified: Secondary | ICD-10-CM | POA: Diagnosis not present

## 2021-04-24 DIAGNOSIS — M4722 Other spondylosis with radiculopathy, cervical region: Secondary | ICD-10-CM | POA: Diagnosis not present

## 2021-04-29 DIAGNOSIS — E119 Type 2 diabetes mellitus without complications: Secondary | ICD-10-CM | POA: Diagnosis not present

## 2021-04-29 DIAGNOSIS — E785 Hyperlipidemia, unspecified: Secondary | ICD-10-CM | POA: Diagnosis not present

## 2021-04-29 DIAGNOSIS — Z794 Long term (current) use of insulin: Secondary | ICD-10-CM | POA: Diagnosis not present

## 2021-04-29 DIAGNOSIS — I10 Essential (primary) hypertension: Secondary | ICD-10-CM | POA: Diagnosis not present

## 2021-04-29 DIAGNOSIS — E1169 Type 2 diabetes mellitus with other specified complication: Secondary | ICD-10-CM | POA: Diagnosis not present

## 2021-04-30 LAB — CBC WITH DIFFERENTIAL/PLATELET
Basophils Absolute: 0.1 10*3/uL (ref 0.0–0.2)
Basos: 1 %
EOS (ABSOLUTE): 0.4 10*3/uL (ref 0.0–0.4)
Eos: 7 %
Hematocrit: 43.1 % (ref 37.5–51.0)
Hemoglobin: 14.8 g/dL (ref 13.0–17.7)
Immature Grans (Abs): 0 10*3/uL (ref 0.0–0.1)
Immature Granulocytes: 0 %
Lymphocytes Absolute: 2 10*3/uL (ref 0.7–3.1)
Lymphs: 34 %
MCH: 29.2 pg (ref 26.6–33.0)
MCHC: 34.3 g/dL (ref 31.5–35.7)
MCV: 85 fL (ref 79–97)
Monocytes Absolute: 0.5 10*3/uL (ref 0.1–0.9)
Monocytes: 9 %
Neutrophils Absolute: 2.8 10*3/uL (ref 1.4–7.0)
Neutrophils: 49 %
Platelets: 187 10*3/uL (ref 150–450)
RBC: 5.06 x10E6/uL (ref 4.14–5.80)
RDW: 14.3 % (ref 11.6–15.4)
WBC: 5.8 10*3/uL (ref 3.4–10.8)

## 2021-04-30 LAB — LIPID PANEL WITH LDL/HDL RATIO
Cholesterol, Total: 146 mg/dL (ref 100–199)
HDL: 43 mg/dL (ref >39)
LDL Chol Calc (NIH): 82 mg/dL (ref 0–99)
LDL/HDL Ratio: 1.9 ratio (ref 0.0–3.6)
Triglycerides: 115 mg/dL (ref 0–149)
VLDL Cholesterol Cal: 21 mg/dL (ref 5–40)

## 2021-04-30 LAB — CMP14+EGFR
ALT: 36 IU/L (ref 0–44)
AST: 30 IU/L (ref 0–40)
Albumin/Globulin Ratio: 2.1 (ref 1.2–2.2)
Albumin: 4.6 g/dL (ref 3.8–4.8)
Alkaline Phosphatase: 60 IU/L (ref 44–121)
BUN/Creatinine Ratio: 21 (ref 10–24)
BUN: 18 mg/dL (ref 8–27)
Bilirubin Total: 0.3 mg/dL (ref 0.0–1.2)
CO2: 22 mmol/L (ref 20–29)
Calcium: 9.6 mg/dL (ref 8.6–10.2)
Chloride: 104 mmol/L (ref 96–106)
Creatinine, Ser: 0.85 mg/dL (ref 0.76–1.27)
Globulin, Total: 2.2 g/dL (ref 1.5–4.5)
Glucose: 135 mg/dL — ABNORMAL HIGH (ref 70–99)
Potassium: 4.6 mmol/L (ref 3.5–5.2)
Sodium: 140 mmol/L (ref 134–144)
Total Protein: 6.8 g/dL (ref 6.0–8.5)
eGFR: 98 mL/min/{1.73_m2} (ref >59)

## 2021-04-30 LAB — HEMOGLOBIN A1C
Est. average glucose Bld gHb Est-mCnc: 134 mg/dL
Hgb A1c MFr Bld: 6.3 % — ABNORMAL HIGH (ref 4.8–5.6)

## 2021-05-02 DIAGNOSIS — E119 Type 2 diabetes mellitus without complications: Secondary | ICD-10-CM | POA: Diagnosis not present

## 2021-05-06 ENCOUNTER — Other Ambulatory Visit: Payer: Self-pay | Admitting: Internal Medicine

## 2021-05-06 DIAGNOSIS — E119 Type 2 diabetes mellitus without complications: Secondary | ICD-10-CM

## 2021-05-12 ENCOUNTER — Ambulatory Visit: Payer: Medicare HMO | Admitting: Nurse Practitioner

## 2021-05-13 ENCOUNTER — Telehealth: Payer: Self-pay

## 2021-05-13 ENCOUNTER — Ambulatory Visit (INDEPENDENT_AMBULATORY_CARE_PROVIDER_SITE_OTHER): Payer: Medicare HMO | Admitting: Pharmacist

## 2021-05-13 ENCOUNTER — Ambulatory Visit (INDEPENDENT_AMBULATORY_CARE_PROVIDER_SITE_OTHER): Payer: Medicare HMO | Admitting: Nurse Practitioner

## 2021-05-13 ENCOUNTER — Encounter: Payer: Self-pay | Admitting: Nurse Practitioner

## 2021-05-13 ENCOUNTER — Other Ambulatory Visit: Payer: Self-pay

## 2021-05-13 VITALS — BP 138/78 | HR 80 | Ht 68.0 in | Wt 235.0 lb

## 2021-05-13 DIAGNOSIS — E119 Type 2 diabetes mellitus without complications: Secondary | ICD-10-CM

## 2021-05-13 DIAGNOSIS — E039 Hypothyroidism, unspecified: Secondary | ICD-10-CM | POA: Diagnosis not present

## 2021-05-13 DIAGNOSIS — Z6837 Body mass index (BMI) 37.0-37.9, adult: Secondary | ICD-10-CM | POA: Diagnosis not present

## 2021-05-13 DIAGNOSIS — M25551 Pain in right hip: Secondary | ICD-10-CM

## 2021-05-13 DIAGNOSIS — I10 Essential (primary) hypertension: Secondary | ICD-10-CM

## 2021-05-13 DIAGNOSIS — E1169 Type 2 diabetes mellitus with other specified complication: Secondary | ICD-10-CM

## 2021-05-13 DIAGNOSIS — R109 Unspecified abdominal pain: Secondary | ICD-10-CM | POA: Insufficient documentation

## 2021-05-13 DIAGNOSIS — E785 Hyperlipidemia, unspecified: Secondary | ICD-10-CM | POA: Diagnosis not present

## 2021-05-13 DIAGNOSIS — R1012 Left upper quadrant pain: Secondary | ICD-10-CM

## 2021-05-13 DIAGNOSIS — Z794 Long term (current) use of insulin: Secondary | ICD-10-CM | POA: Diagnosis not present

## 2021-05-13 DIAGNOSIS — W19XXXA Unspecified fall, initial encounter: Secondary | ICD-10-CM

## 2021-05-13 DIAGNOSIS — G4733 Obstructive sleep apnea (adult) (pediatric): Secondary | ICD-10-CM | POA: Diagnosis not present

## 2021-05-13 MED ORDER — IBUPROFEN 600 MG PO TABS: 600.0000 mg | ORAL_TABLET | Freq: Two times a day (BID) | ORAL | 0 refills | Status: DC | PRN

## 2021-05-13 MED ORDER — TRESIBA FLEXTOUCH 200 UNIT/ML ~~LOC~~ SOPN: 70.0000 [IU] | PEN_INJECTOR | Freq: Every day | SUBCUTANEOUS | 2 refills | Status: DC

## 2021-05-13 MED ORDER — GVOKE HYPOPEN 2-PACK 1 MG/0.2ML ~~LOC~~ SOAJ: 1.0000 mg | Freq: Every day | SUBCUTANEOUS | 0 refills | Status: AC | PRN

## 2021-05-13 NOTE — Assessment & Plan Note (Signed)
Takes Synthroid 137 mcg tablets daily ?Check TSH T4 today ?

## 2021-05-13 NOTE — Assessment & Plan Note (Signed)
?  Patient alert oriented x4 denies nausea vomiting headaches, confusion. ?Need to prevent fall discussed with patient ?Takes opioids for chronic pain management, patient educated on the need to take medications as prescribed he verbalized understanding.  ? ?

## 2021-05-13 NOTE — Patient Instructions (Addendum)
Please get your shingles vaccine, TDAP vaccine at your pharmacy. ?Please schedule your diabetic eye exam at your eye doctor's office.  ?Please take your glucagon 1mg  injection as needed for sever hypoglycemia.  ?Please get your abdominal ultrasound done. ?Please take ibuprofen 600mg  two times daily as needed for your right hip pain  ? ?It is important that you exercise regularly at least 30 minutes 5 times a week.  ?Think about what you will eat, plan ahead. ?Choose " clean, green, fresh or frozen" over canned, processed or packaged foods which are more sugary, salty and fatty. ?70 to 75% of food eaten should be vegetables and fruit. ?Three meals at set times with snacks allowed between meals, but they must be fruit or vegetables. ?Aim to eat over a 12 hour period , example 7 am to 7 pm, and STOP after  your last meal of the day. ?Drink water,generally about 64 ounces per day, no other drink is as healthy. Fruit juice is best enjoyed in a healthy way, by EATING the fruit. ? ?Thanks for choosing [facility], we consider it a privelige to serve you.  ?

## 2021-05-13 NOTE — Progress Notes (Signed)
? ?Thomas Rollins     MRN: [id number]      DOB: 03/13/1960 ? ? ?HPI ?Mr. 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 ? ? ?Patient states that he has been wearing CPAP for about 30 year, need the supply for his CPAP his last supply was picked up from the vendor last year, he has been trying to order supply on the phone but he has not been able to reach.  Patient states that he will call his ''lungs doctor'' about this  ? ?Pt c/o of fall 4 days ago  he was going down to his goats, while walking  on a mud  he slipped and fell backwards hitting the back of his head. He can not remember passing out. He has muscle spasm on his back.  He currently denies HA, nausea, vomiting , recent changes in his vision. ? ? Pt c/o chronic right hip pain that started years ago after a fall, pt rates his pain as 8/10 takes morphine 90 mg BID since 2006. Flexeril 10mg  as needed.  Patient denies numbness tingling. ? ?Patient complains of abdominal pain that started 4 days ago he describes his abdominal pain as sharp constant pain 8/10, pain is localized to left upper abdominal quadrant has a bruised area around where the pain is patient denies nausea vomiting bloody stool.  Denies drinking alcohol.  Patient denies falling on his stomach or any kind of trauma to his stomach. ? ?He drinks ovaltine, or a piece of cookie if his sugar drops, lowest it dropped to was 40 ? ? ?Pt will call my eye doctor to schedule diabetice eye exam. Got flu vaccine at [facility], does not want to get anymore covid booster.  ? ? ?ROS ?Denies recent fever or chills. ?Denies sinus pressure, nasal congestion, ear pain or sore throat. ?Denies chest congestion, productive cough or wheezing. ?Denies chest pains, palpitations and leg swelling ?Has abdominal pain,denies  nausea, vomiting,diarrhea or constipation.   ?Denies dysuria,  frequency, hesitancy or incontinence. ?Has chronic joint pain, no swelling and limitation in mobility. ?Denies headaches, seizures, numbness, or tingling. ?Denies skin break down or rash. ? ? ?PE ? ?BP 138/78 (BP Location: Left Arm, Cuff Size: Large)   Pulse 80   Ht 5\' 8"  (1.727 m)   Wt 235 lb (106.6 kg)   SpO2 98%   BMI 35.73 kg/m?  ? ?Patient alert and oriented and in no cardiopulmonary distress. ? ?HEENT: No facial asymmetry, EOMI,     Neck supple . ? ?Chest: Clear to auscultation bilaterally. ? ?CVS: S1, S2 no murmurs, no S3.Regular rate. ? ?ABD: Soft obese, tenderness of left upper abdominal quadrant, tender area appears bruised. ? ?Ext: No edema ? ?MS: Adequate ROM spine, shoulders, hips and knees.[facility] tenderness on palpation of right hip.  ? ?Psych: Good eye contact, normal affect. Memory intact not anxious or depressed appearing. ? ?CNS: CN 2-12 intact, power,  normal throughout.no focal deficits noted. ? ? ?Assessment & Plan ?Body mass index (BMI) 37.0-37.9, adult ?Wt Readings from Last 3 Encounters:  ?05/13/21 235 lb (106.6 kg)  ?03/13/21 239 lb (108.4 kg)  ?02/10/21 236 lb 0.6 oz (107.1 kg)  ? ?Pt states that He has  been exercising all the time. ?Patient encouraged to increase intake of whole foods consisting mainly of vegetables, protein, less carbohydrate engage in moderate exercise 30 minutes 5 days  a week patient verbalized understanding.  ? ? ?Fall ? ?Patient alert oriented x4 denies nausea vomiting headaches, confusion. ?Need to prevent fall discussed with patient ?Takes opioids for chronic pain management, patient educated on the need to take medications as prescribed he verbalized understanding.  ? ? ?Hypothyroidism ?Takes Synthroid 137 mcg tablets daily ?Check TSH T4 today ? ?Abdominal pain ?Reports that pain started 4 days ago ?Denies nausea vomiting ?Has a bruised area at the tender site. ?Check lipase and amylase to rule out pancreatitis ?Abdominal ultrasound ordered to rule out  pancreatitis.  ? ?Right hip pain ?Chronic condition.  Worsening the past 4 days ?Takes morphine 90 3 times daily as needed, twice daily, and Flexeril 5mg  TID. ?RX ibuprofen 600 mg twice daily, engage in moderate exercise as tolerated. ? ?Hyperlipidemia associated with type 2 diabetes mellitus (HCC) ?Lab Results  ?Component Value Date  ? CHOL 146 04/29/2021  ? HDL 43 04/29/2021  ? LDLCALC 82 04/29/2021  ? TRIG 115 04/29/2021  ? CHOLHDL 3.7 02/05/2017  ?Condition well-controlled continue omega-3 fatty acid 2000 mg twice daily, simvastatin 40 mg daily. ? ?Essential hypertension ?BP Readings from Last 3 Encounters:  ?05/13/21 138/78  ?02/10/21 124/77  ?02/03/21 134/68  ?Blood pressure slightly elevated goal is less than 130/80, takes lisinopril 10 mg daily. ? DASH diet advised engage in exercise as tolerated. ?Continue current medication ? ? ?Diabetes mellitus type 2, insulin dependent (HCC) ?Lab Results  ?Component Value Date  ? HGBA1C 6.3 (H) 04/29/2021  ?Condition currently controlled on metformin 1000 mg twice daily, Trulicity 1.5 mg once weekly injection,  Tresiba 100 units daily.  Patient stated that he stopped taking Trulicity some months ago due to nausea vomiting but he has restarted medication about 2 weeks ago.  Currently denies nausea vomiting medications adjusted by clinical pharmacist today.  Patient told to start taking Tresiba 70 units daily, continue metformin 1000 mg twice daily, Trulicity 1.5 mg weekly. ?Patient recalls occasional hypoglycemic episodes.  States that taking a piece of cookie or over to drink brings his blood sugar up after having a hypoglycemic episode. ?Glucagon 1 mg pen ordered today.  Patient told to take medication if his blood sugar does not go above 70 after treating his hypoglycemia he verbalized understanding.  ?Foot exam completed today ?Patient states that he will call my eye doctor to to schedule diabetic eye exam  ? ?

## 2021-05-13 NOTE — Telephone Encounter (Signed)
FYI

## 2021-05-13 NOTE — Telephone Encounter (Signed)
Patient called to let Mitzi Davenport know he got in touch with his DME and they will be sending him an CPAP machine replacement. ?

## 2021-05-13 NOTE — Assessment & Plan Note (Addendum)
BP Readings from Last 3 Encounters:  ?05/13/21 138/78  ?02/10/21 124/77  ?02/03/21 134/68  ?Blood pressure slightly elevated goal is less than 130/80, takes lisinopril 10 mg daily. ? DASH diet advised engage in exercise as tolerated. ?Continue current medication ? ?

## 2021-05-13 NOTE — Assessment & Plan Note (Signed)
Lab Results  ?Component Value Date  ? CHOL 146 04/29/2021  ? HDL 43 04/29/2021  ? LDLCALC 82 04/29/2021  ? TRIG 115 04/29/2021  ? CHOLHDL 3.7 02/05/2017  ?Condition well-controlled continue omega-3 fatty acid 2000 mg twice daily, simvastatin 40 mg daily. ?

## 2021-05-13 NOTE — Assessment & Plan Note (Signed)
Reports that pain started 4 days ago ?Denies nausea vomiting ?Has a bruised area at the tender site. ?Check lipase and amylase to rule out pancreatitis ?Abdominal ultrasound ordered to rule out pancreatitis.  ?

## 2021-05-13 NOTE — Chronic Care Management (AMB) (Signed)
? ? ?Chronic Care Management ?Pharmacy Note ? ?05/13/2021 ?Name:  Thomas Rollins MRN:  [id number] DOB:  29-Apr-1960 ? ?Summary: ?Type 2 Diabetes ?Now controlled; Most recent A1c at goal of <7% per ADA guidelines ?Current medications: metformin 1,000 mg by mouth twice daily with meals, dulaglutide (Trulicity) 1.5 mg subcutaneously weekly, and insulin degludec [name]) 100 units subcutaneously once daily ?Recent changes: patient met with primary care provider recently wanting to stop Trulicity due to nausea; however, over the last 1-2 weeks he has restarted Trulicity and is tolerating without nausea currently ?Reports a couple minor hypoglycemic events which were validated on continuous glucose monitor  ?Current glucose readings: see CGM report below for last 30 days. GMI same as A1c (6.3%). Time in range 93%, time above range 6%, and time below range 1%.   ?Continue metformin 1,000 mg by mouth twice daily with meals and dulaglutide (Trulicity) 1.5 mg subcutaneously weekly on Tuesday ?Discussed with PCP and will decrease Tresiba to 70 units due to a few minor hypoglycemia events recently. 70 units worked well for him before when he was taking Trulicity. He was recently increased to [place] 100 units when the plan was to discontinue Trulicity.  ?Continue to monitor blood glucose with Dexcom G6. Low blood glucose alarm is set for <70. High blood glucose alarm set for >250. Patient aware to use finger stick blood glucose checks as needed if symptoms do not match sensor glucose reading. Will follow-up with blood glucose via Dexcom and recommend adjustments to medications as necessary.  ? ? ?Subjective: ?Thomas Rollins is an 61 y.o. year old male who is a primary patient of Renee Rival, FNP.  The CCM team was consulted for assistance with disease management and care coordination needs.   ? ?Engaged with patient face to face for follow up visit in response to provider referral for pharmacy case management and/or care  coordination services.  ? ?Consent to Services:  ?The patient was given information about Chronic Care Management services, agreed to services, and gave verbal consent prior to initiation of services.  Please see initial visit note for detailed documentation.  ? ?Patient Care Team: ?Renee Rival, FNP as PCP - General (Nurse Practitioner) ?Caren Macadam, MD as PCP - Family Medicine (Family Medicine) ?Eloise Harman, DO as Consulting Physician (Gastroenterology) ?Beryle Lathe, [facility] (Pharmacist) ? ?Objective: ? ?Lab Results  ?Component Value Date  ? CREATININE 0.85 04/29/2021  ? CREATININE 0.77 01/31/2021  ? CREATININE 0.89 12/03/2020  ? ? ?Lab Results  ?Component Value Date  ? HGBA1C 6.3 (H) 04/29/2021  ? ?Last diabetic Eye exam: No results found for: HMDIABEYEEXA  ?Last diabetic Foot exam: No results found for: HMDIABFOOTEX  ? ?   ?Component Value Date/Time  ? CHOL 146 04/29/2021 1040  ? TRIG 115 04/29/2021 1040  ? HDL 43 04/29/2021 1040  ? CHOLHDL 3.7 02/05/2017 1101  ? [place] 82 04/29/2021 1040  ? [place] 87 02/05/2017 1101  ? ? ?Hepatic Function Latest Ref Rng & Units 04/29/2021 12/03/2020 12/03/2020  ?Total Protein 6.0 - 8.5 g/dL 6.8 6.8 6.6  ?Albumin 3.8 - 4.8 g/dL 4.6 4.6 4.7  ?AST 0 - 40 IU/L $Remov'30 28 28  'mjJxXF$ ?ALT 0 - 44 IU/L 36 43 44  ?Alk Phosphatase 44 - 121 IU/L 60 83 79  ?Total Bilirubin 0.0 - 1.2 mg/dL 0.3 0.5 0.4  ?Bilirubin, Direct 0.00 - 0.40 mg/dL - - 0.13  ? ? ?Lab Results  ?Component Value Date/Time  ? TSH 7.980 (H)  12/03/2020 10:26 AM  ? TSH 2.190 08/02/2020 10:56 AM  ? FREET4 0.92 12/03/2020 10:26 AM  ? FREET4 1.35 08/02/2020 10:56 AM  ? ? ?CBC Latest Ref Rng & Units 04/29/2021 12/03/2020 08/02/2020  ?WBC 3.4 - 10.8 x10E3/uL 5.8 7.1 7.5  ?Hemoglobin 13.0 - 17.7 g/dL 14.8 15.5 15.4  ?Hematocrit 37.5 - 51.0 % 43.1 46.9 46.5  ?Platelets 150 - 450 x10E3/uL 187 206 206  ? ? ?Lab Results  ?Component Value Date/Time  ? VD25OH 33.1 01/31/2020 11:28 AM  ? ? ?Clinical ASCVD: No  ?The 10-year ASCVD  risk score (Arnett DK, et al., 2019) is: 22.6% ?  Values used to calculate the score: ?    Age: 49 years ?    Sex: Male ?    Is Non-Hispanic African American: No ?    Diabetic: Yes ?    Tobacco smoker: No ?    Systolic Blood Pressure: 836 mmHg ?    Is BP treated: Yes ?    HDL Cholesterol: 43 mg/dL ?    Total Cholesterol: 146 mg/dL   ? ? ?Social History  ? ?Tobacco Use  ?Smoking Status Former  ? Types: Cigarettes  ?Smokeless Tobacco Never  ? ?BP Readings from Last 3 Encounters:  ?05/13/21 (!) 145/82  ?02/10/21 124/77  ?02/03/21 134/68  ? ?Pulse Readings from Last 3 Encounters:  ?05/13/21 80  ?02/10/21 79  ?02/03/21 68  ? ?Wt Readings from Last 3 Encounters:  ?05/13/21 235 lb (106.6 kg)  ?03/13/21 239 lb (108.4 kg)  ?02/10/21 236 lb 0.6 oz (107.1 kg)  ? ? ?Assessment: Review of patient past medical history, allergies, medications, health status, including review of consultants reports, laboratory and other test data, was performed as part of comprehensive evaluation and provision of chronic care management services.  ? ?SDOH:  (Social Determinants of Health) assessments and interventions performed:  ? ? ?CCM Care Plan ? ?Allergies  ?Allergen Reactions  ? Codeine Other (See Comments)  ?  Slurred Speech   ? Other   ?  [place] - N/V   ?sweat potatoes - N/V   ? Penicillins Rash  ? ? ?Medications Reviewed Today   ? ? Reviewed by Jill Side, [place] (Certified Medical Assistant) on 05/13/21 at 31  Med List Status: <None>  ? ?Medication Order Taking? Sig Documenting Provider Last Dose Status Informant  ?albuterol (VENTOLIN HFA) 108 (90 Base) MCG/ACT inhaler [id number] Yes Inhale 2 puffs into the lungs every 6 (six) hours as needed for wheezing or shortness of breath. Noreene Larsson, NP Taking Active Self  ?ALPHAGAN P 0.1 % SOLN [id number] Yes Place 1 drop into both eyes 2 (two) times daily as needed (irritation). [provider] Taking Active Self  ?         ?Med Note Bridgette Habermann   Tue Dec 17, 2020  2:48 PM)     ?ALPRAZolam (XANAX) 0.5 MG tablet [id number] Yes Take 1 tablet (0.5 mg total) by mouth 2 (two) times daily as needed for anxiety. Noreene Larsson, NP Taking Active Self  ?AMBULATORY NON FORMULARY MEDICATION [id number] Yes 1 vial by Intracavernosal route as needed. Medication Name:  ?Prostaglandin E1, 10 mcg vials.  ?Dispense 5 with 11 refills  ?Inject 1 vial as needed Franchot Gallo, MD Taking Active Self  ?blood glucose meter kit and supplies [id number] Yes Dispense based on patient and insurance preference. Use up to four times daily as directed. (FOR ICD-10 E10.9, E11.9). Noreene Larsson, NP Taking Active Self  ?Continuous  Blood Gluc Receiver ([place]) DEVI [id number] Yes Use to monitor glucose continuously [provider] Taking Active   ?Continuous Blood Gluc Sensor (DEXCOM G6 SENSOR) MISC [id number] Yes Change sensor every 10 days Noreene Larsson, NP Taking Active Self  ?Continuous Blood Gluc Transmit (DEXCOM G6 TRANSMITTER) MISC [id number] Yes Use to monitor glucose continuously. Change every 90 days. [provider] Taking Active Self  ?cyclobenzaprine (FLEXERIL) 10 MG tablet [id number] Yes Take 10 mg by mouth 3 (three) times daily as needed for muscle spasms. [provider] Taking Active Self  ?Diclofenac Sodium 3 % GEL [id number] Yes Apply 1 g topically 4 (four) times daily as needed (pain). [provider] Taking Active Self  ?DULoxetine (CYMBALTA) 60 MG capsule [id number] Yes TAKE 1 CAPSULE BY MOUTH EVERY DAY Lindell Spar, MD Taking Active Self  ?gabapentin (NEURONTIN) 300 MG capsule [id number] Yes TAKE ONE CAPSULE BY MOUTH TWICE DAILY  ?Patient taking differently: Take 300 mg by mouth daily.  ? Noreene Larsson, NP Taking Active   ?GLOBAL EASE INJECT PEN NEEDLES 31G X 8 MM MISC [id number] Yes Use to inject insulin once daily Noreene Larsson, NP Taking Active   ?ibuprofen (ADVIL) 200 MG tablet [id number] Yes Take 400 mg by mouth every 6 (six) hours as needed for  moderate pain or headache. [provider] Taking Active Self  ?insulin degludec (TRESIBA FLEXTOUCH) 200 UNIT/ML FlexTouch Pen [id number] Yes Inject 70 Units into the skin daily. Vena Rua

## 2021-05-13 NOTE — Assessment & Plan Note (Addendum)
Wt Readings from Last 3 Encounters:  ?05/13/21 235 lb (106.6 kg)  ?03/13/21 239 lb (108.4 kg)  ?02/10/21 236 lb 0.6 oz (107.1 kg)  ? ?Pt states that He has  been exercising all the time. ?Patient encouraged to increase intake of whole foods consisting mainly of vegetables, protein, less carbohydrate engage in moderate exercise 30 minutes 5 days a week patient verbalized understanding.  ? ?

## 2021-05-13 NOTE — Assessment & Plan Note (Addendum)
Chronic condition.  Worsening the past 4 days ?Takes morphine 90 3 times daily as needed, twice daily, and Flexeril 5mg  TID. ?RX ibuprofen 600 mg twice daily, engage in moderate exercise as tolerated. ?

## 2021-05-13 NOTE — Assessment & Plan Note (Addendum)
Lab Results  ?Component Value Date  ? HGBA1C 6.3 (H) 04/29/2021  ?Condition currently controlled on metformin 1000 mg twice daily, Trulicity 1.5 mg once weekly injection,  Tresiba 100 units daily.  Patient stated that he stopped taking Trulicity some months ago due to nausea vomiting but he has restarted medication about 2 weeks ago.  Currently denies nausea vomiting medications adjusted by clinical pharmacist today.  Patient told to start taking Tresiba 70 units daily, continue metformin 1000 mg twice daily, Trulicity 1.5 mg weekly. ?Patient recalls occasional hypoglycemic episodes.  States that taking a piece of cookie or over to drink brings his blood sugar up after having a hypoglycemic episode. ?Glucagon 1 mg pen ordered today.  Patient told to take medication if his blood sugar does not go above 70 after treating his hypoglycemia he verbalized understanding.  ?Foot exam completed today ?Patient states that he will call my eye doctor to to schedule diabetic eye exam ?

## 2021-05-13 NOTE — Patient Instructions (Signed)
Thomas Rollins, ? ?It was great to talk to you today! ? ?Please call me with any questions or concerns. ? ?Visit Information ? ?Following are the goals we discussed today:  ? Goals Addressed   ? ?  ?  ?  ?  ? This Visit's Progress  ?  Medication Management     ?  Patient Goals/Self-Care Activities ?Over the next 90 days, patient will:  ?Take medications as prescribed ?Check blood sugar continuously with continuous glucose monitor, document, and provide at future appointments ?Engage in dietary modifications by fewer sweetened foods & beverages and watch portion sizes/amount of food eaten at one time ? ? ? ? ? ? ?  ? ?  ?  ? ?Follow-up plan:  Follow-up with primary care provider ? ?Patient verbalizes understanding of instructions and care plan provided today and agrees to view in MyChart. Active MyChart status confirmed with patient.   ? ?Please call the care guide team at [phone] if you need to cancel or reschedule your appointment.  ? ?Domenic Moras, PharmD, BCACP, CPP ?Clinical Pharmacist Practitioner ?[facility] ?[phone]  ?

## 2021-05-14 ENCOUNTER — Telehealth: Payer: Self-pay

## 2021-05-14 ENCOUNTER — Ambulatory Visit (HOSPITAL_COMMUNITY)
Admission: RE | Admit: 2021-05-14 | Discharge: 2021-05-14 | Disposition: A | Discharge status: Home / Self Care | Payer: Medicare HMO | Source: Ambulatory Visit | Attending: Nurse Practitioner | Admitting: Nurse Practitioner

## 2021-05-14 DIAGNOSIS — R1012 Left upper quadrant pain: Secondary | ICD-10-CM | POA: Insufficient documentation

## 2021-05-14 DIAGNOSIS — K7460 Unspecified cirrhosis of liver: Secondary | ICD-10-CM | POA: Diagnosis not present

## 2021-05-14 DIAGNOSIS — K7689 Other specified diseases of liver: Secondary | ICD-10-CM | POA: Diagnosis not present

## 2021-05-14 DIAGNOSIS — K838 Other specified diseases of biliary tract: Secondary | ICD-10-CM | POA: Diagnosis not present

## 2021-05-14 DIAGNOSIS — R161 Splenomegaly, not elsewhere classified: Secondary | ICD-10-CM | POA: Diagnosis not present

## 2021-05-14 LAB — TSH+FREE T4
Free T4: 1.63 ng/dL (ref 0.82–1.77)
TSH: 0.512 u[IU]/mL (ref 0.450–4.500)

## 2021-05-14 LAB — AMYLASE: Amylase: 84 U/L (ref 31–110)

## 2021-05-14 LAB — LIPASE: Lipase: 19 U/L (ref 13–78)

## 2021-05-14 NOTE — Telephone Encounter (Signed)
Patient returning phone call 

## 2021-05-14 NOTE — Progress Notes (Signed)
TSH is normal, continue levothyroxine 137 mcg daily. ?Normal lipase and amylase labs.pt should get his abdominal US done

## 2021-05-15 NOTE — Telephone Encounter (Signed)
Spoke with pt

## 2021-05-15 NOTE — Progress Notes (Signed)
?  I discussed results with pt.  patient states that bruised area on his abdomen still feels sore,but that soreness  is better, patient told to call this office if there is an enlargement of the bruised area or if his pain gets worse, he  verbalized understanding.

## 2021-05-23 DIAGNOSIS — E1169 Type 2 diabetes mellitus with other specified complication: Secondary | ICD-10-CM

## 2021-05-23 DIAGNOSIS — I10 Essential (primary) hypertension: Secondary | ICD-10-CM

## 2021-05-23 DIAGNOSIS — E785 Hyperlipidemia, unspecified: Secondary | ICD-10-CM

## 2021-05-23 DIAGNOSIS — E119 Type 2 diabetes mellitus without complications: Secondary | ICD-10-CM

## 2021-05-23 DIAGNOSIS — Z794 Long term (current) use of insulin: Secondary | ICD-10-CM

## 2021-05-27 ENCOUNTER — Other Ambulatory Visit: Payer: Self-pay

## 2021-05-27 MED ORDER — SIMVASTATIN 40 MG PO TABS: 40.0000 mg | ORAL_TABLET | Freq: Every evening | ORAL | 3 refills | Status: DC

## 2021-06-02 DIAGNOSIS — E119 Type 2 diabetes mellitus without complications: Secondary | ICD-10-CM | POA: Diagnosis not present

## 2021-06-09 ENCOUNTER — Other Ambulatory Visit: Payer: Self-pay | Admitting: Internal Medicine

## 2021-06-09 DIAGNOSIS — G4700 Insomnia, unspecified: Secondary | ICD-10-CM

## 2021-06-09 DIAGNOSIS — E039 Hypothyroidism, unspecified: Secondary | ICD-10-CM

## 2021-06-13 DIAGNOSIS — G4733 Obstructive sleep apnea (adult) (pediatric): Secondary | ICD-10-CM | POA: Diagnosis not present

## 2021-06-19 ENCOUNTER — Telehealth: Payer: Self-pay

## 2021-06-19 DIAGNOSIS — G894 Chronic pain syndrome: Secondary | ICD-10-CM | POA: Diagnosis not present

## 2021-06-19 DIAGNOSIS — Z79899 Other long term (current) drug therapy: Secondary | ICD-10-CM | POA: Diagnosis not present

## 2021-06-19 NOTE — Telephone Encounter (Signed)
Called pt to get more info no answer left vm looks like per fola's last visit he is suppose to be on injection ?

## 2021-06-19 NOTE — Telephone Encounter (Signed)
Patient called has not taken insulin concern does he need to continue the shots. ? ?Call back # [phone] ?

## 2021-06-20 ENCOUNTER — Other Ambulatory Visit (HOSPITAL_COMMUNITY): Payer: Self-pay

## 2021-06-20 NOTE — Telephone Encounter (Signed)
Patient assistance needs but not [facility]. Will outreach medication access technician to support.  ?

## 2021-06-20 NOTE — Telephone Encounter (Signed)
Pt's info was forwarded to me to look into for patient assistance.  ? ?After running a test claim for patients Trulicity using [facility], Trulicity copay is $10 for 30 day supply AND 90 day supply. This is while using his Part D insurance. ? ?Evaristo Bury also has a $10 copay, 36 day supply. I called pt's pharmacy and this medication is filled and ready. ? ?Would pt like to apply for patient assistance for these medications with novo nordisk and trulicity? ?

## 2021-06-20 NOTE — Telephone Encounter (Signed)
Spoke with pt he is not able to take his trulicity due to not being able to afford it he was being assistance by chris previously. Forwarded info to catie to see what can be done. Pt aware ?

## 2021-06-24 ENCOUNTER — Telehealth: Payer: Self-pay | Admitting: Nurse Practitioner

## 2021-06-24 NOTE — Telephone Encounter (Signed)
Pt returning your call

## 2021-06-24 NOTE — Telephone Encounter (Signed)
Spoke with pt advised of message he will let us know as he is walking into the pharmacy now ? ?

## 2021-06-24 NOTE — Telephone Encounter (Signed)
Called pt no answer left vm 

## 2021-06-27 ENCOUNTER — Ambulatory Visit: Payer: Medicare HMO | Admitting: Nurse Practitioner

## 2021-06-30 DIAGNOSIS — E1140 Type 2 diabetes mellitus with diabetic neuropathy, unspecified: Secondary | ICD-10-CM | POA: Diagnosis not present

## 2021-06-30 DIAGNOSIS — M4722 Other spondylosis with radiculopathy, cervical region: Secondary | ICD-10-CM | POA: Diagnosis not present

## 2021-07-03 DIAGNOSIS — E119 Type 2 diabetes mellitus without complications: Secondary | ICD-10-CM | POA: Diagnosis not present

## 2021-07-07 ENCOUNTER — Other Ambulatory Visit: Payer: Self-pay | Admitting: Internal Medicine

## 2021-07-13 DIAGNOSIS — G4733 Obstructive sleep apnea (adult) (pediatric): Secondary | ICD-10-CM | POA: Diagnosis not present

## 2021-07-14 ENCOUNTER — Other Ambulatory Visit: Payer: Self-pay | Admitting: Nurse Practitioner

## 2021-07-14 DIAGNOSIS — E119 Type 2 diabetes mellitus without complications: Secondary | ICD-10-CM

## 2021-07-28 ENCOUNTER — Ambulatory Visit (INDEPENDENT_AMBULATORY_CARE_PROVIDER_SITE_OTHER): Payer: Medicare HMO

## 2021-07-28 DIAGNOSIS — Z0000 Encounter for general adult medical examination without abnormal findings: Secondary | ICD-10-CM

## 2021-07-28 NOTE — Patient Instructions (Signed)
  Thomas Rollins , Thank you for taking time to come for your Medicare Wellness Visit. I appreciate your ongoing commitment to your health goals. Please review the following plan we discussed and let me know if I can assist you in the future.   These are the goals we discussed:  Goals      Medication Management     Patient Goals/Self-Care Activities Over the next 90 days, patient will:  Take medications as prescribed Check blood sugar continuously with continuous glucose monitor, document, and provide at future appointments Engage in dietary modifications by fewer sweetened foods & beverages and watch portion sizes/amount of food eaten at one time           Protect My Health     Take care of myself.         This is a list of the screening recommended for you and due dates:  Health Maintenance  Topic Date Due   Eye exam for diabetics  Never done   Tetanus Vaccine  Never done   Zoster (Shingles) Vaccine (1 of 2) Never done   Flu Shot  09/23/2021   Hemoglobin A1C  10/30/2021   Complete foot exam   05/14/2022   Colon Cancer Screening  02/04/2031   Hepatitis C Screening: USPSTF Recommendation to screen - Ages 18-79 yo.  Completed   HIV Screening  Completed   HPV Vaccine  Aged Out   COVID-19 Vaccine  Discontinued

## 2021-07-28 NOTE — Progress Notes (Signed)
Subjective:   Thomas Rollins is a 61 y.o. male who presents for Medicare Annual/Subsequent preventive examination. I connected with  DIXON LUCZAK on 07/28/21 by a audio enabled telemedicine application and verified that I am speaking with the correct person using two identifiers.  Patient Location: Home  Provider Location: Office/Clinic  I discussed the limitations of evaluation and management by telemedicine. The patient expressed understanding and agreed to proceed.  Review of Systems           Objective:    There were no vitals filed for this visit. There is no height or weight on file to calculate BMI.     02/03/2021    7:03 AM 01/31/2021    9:52 AM 07/18/2020    9:57 AM 09/17/2019   11:32 AM 09/07/2019    1:03 PM 09/30/2017    8:44 AM 09/05/2017   10:45 PM  Advanced Directives  Does Patient Have a Medical Advance Directive? _0  No No  Would patient like information on creating a medical advance directive? No - Patient declined No - Patient declined No - Patient declined        Current Medications (verified) Outpatient Encounter Medications as of 07/28/2021  Medication Sig   albuterol (VENTOLIN HFA) 108 (90 Base) MCG/ACT inhaler Inhale 2 puffs into the lungs every 6 (six) hours as needed for wheezing or shortness of breath.   ALPHAGAN P 0.1 % SOLN Place 1 drop into both eyes 2 (two) times daily as needed (irritation).   AMBULATORY NON FORMULARY MEDICATION 1 vial by Intracavernosal route as needed. Medication Name:  Prostaglandin E1, 10 mcg vials.  Dispense 5 with 11 refills  Inject 1 vial as needed   blood glucose meter kit and supplies Dispense based on patient and insurance preference. Use up to four times daily as directed. (FOR ICD-10 E10.9, E11.9).   Continuous Blood Gluc Receiver (DEXCOM G6 RECEIVER) DEVI Use to monitor glucose continuously   Continuous Blood Gluc Sensor (DEXCOM G6 SENSOR) MISC Change sensor every 10 days   Continuous Blood Gluc Transmit  (DEXCOM G6 TRANSMITTER) MISC Use to monitor glucose continuously. Change every 90 days.   cyclobenzaprine (FLEXERIL) 10 MG tablet Take 10 mg by mouth 3 (three) times daily as needed for muscle spasms.   Diclofenac Sodium 3 % GEL Apply 1 g topically 4 (four) times daily as needed (pain).   DULoxetine (CYMBALTA) 60 MG capsule TAKE 1 CAPSULE BY MOUTH EVERY DAY   gabapentin (NEURONTIN) 300 MG capsule TAKE ONE CAPSULE BY MOUTH TWICE DAILY (Patient taking differently: Take 300 mg by mouth daily.)   GLOBAL EASE INJECT PEN NEEDLES 31G X 8 MM MISC Use to inject insulin once daily   Glucagon (GVOKE HYPOPEN 2-PACK) 1 MG/0.2ML SOAJ Inject 1 mg into the skin daily as needed.   ibuprofen (ADVIL) 600 MG tablet Take 1 tablet (600 mg total) by mouth 2 (two) times daily as needed.   levothyroxine (SYNTHROID) 137 MCG tablet TAKE 1 TABLET BY MOUTH every morning ON an EMPTY stomach   lisinopril (ZESTRIL) 10 MG tablet TAKE 1 TABLET BY MOUTH DAILY   metFORMIN (GLUCOPHAGE) 1000 MG tablet TAKE 1 TABLET BY MOUTH TWICE DAILY   metoCLOPramide (REGLAN) 5 MG tablet Take 1 tablet (5 mg total) by mouth every 8 (eight) hours as needed for nausea or vomiting.   morphine (MS CONTIN) 30 MG 12 hr tablet Take 1 tablet by mouth 2 (two) times daily. Take with the 74m  morphine (MS CONTIN) 60 MG 12 hr tablet Take 1 tablet by mouth 2 (two) times daily. Takes with the 53m    naloxone (NARCAN) nasal spray 4 mg/0.1 mL Place 1 spray into the nose as needed (opioid overdose).   Omega-3 Fatty Acids (FISH OIL) 1000 MG CAPS Take 2 capsules (2,000 mg total) by mouth in the morning and at bedtime. (Patient taking differently: Take 1 capsule by mouth every morning.)   omeprazole (PRILOSEC) 40 MG capsule Take 1 capsule (40 mg total) by mouth daily.   ONETOUCH ULTRA test strip USE UP TO FOUR TIMES DAILY AS DIRECTED (25 DAY supply)   simvastatin (ZOCOR) 40 MG tablet Take 1 tablet (40 mg total) by mouth every evening.   traZODone (DESYREL) 50 MG  tablet TAKE ONE-HALF TO 1 TABLET BY MOUTH AT BEDTIME AS NEEDED FOR SLEEP   TRESIBA FLEXTOUCH 200 UNIT/ML FlexTouch Pen inject 100 units into THE SKIN DAILY   TRULICITY 1.5 MZJ/6.7HASOPN INJECT ONE PEN INTO THE SKIN ONCE A WEEK   No facility-administered encounter medications on file as of 07/28/2021.    Allergies (verified) Codeine, Other, and Penicillins   History: Past Medical History:  Diagnosis Date   Abnormal LFTs    Anxiety    panic attacks   Chronic pain    Depression    Diabetes mellitus without complication ([place]    Encounter for hepatitis C screening test for low risk patient 04/05/2019   Hyperlipidemia    Hypertension    Hypothyroidism    Sleep apnea    TIA (transient ischemic attack) 2019   Past Surgical History:  Procedure Laterality Date   ankle sx     ARTHROSCOPIC REPAIR ACL     CARPAL TUNNEL RELEASE     COLONOSCOPY WITH PROPOFOL N/A 02/03/2021   Procedure: COLONOSCOPY WITH PROPOFOL;  Surgeon: CEloise Harman DO;  [facility];  Service: Endoscopy;  Laterality: N/A;  8:00am   HERNIA REPAIR     SHOULDER ARTHROSCOPY     Family History  Problem Relation Age of Onset   Stomach cancer Mother 573  Heart Problems Father    Dementia Father    Cancer Father        "tumors"   Bone cancer Brother    Lung cancer Brother    Cancer Daughter 220      Male cancer   Cancer Daughter 252      Male   Colon cancer Neg Hx    Social History   Socioeconomic History   Marital status: Widowed    Spouse name: Not on file   Number of children: 1   Years of education: Not on file   Highest education level: 12th grade  Occupational History   Not on file  Tobacco Use   Smoking status: Former    Types: Cigarettes   Smokeless tobacco: Never  Vaping Use   Vaping Use: Never used  Substance and Sexual Activity   Alcohol use: Not Currently    Comment: none in 1 yr 10/2020   Drug use: Yes    Types: Marijuana    Comment: occas   Sexual activity: Never   Other Topics Concern   Not on file  Social History Narrative   Lives with good friend of his, known for 319years    Only daughter passed away from cancer in S10-Sep-2018      Retired from 2004 in [employer]   Takes time release morphine  b/c fell 15-20 feet from a building while working.    Now disabled        Exercises/walks: 7 days a week   Has pets at home: 5 dogs in the home.      Diet: Avoids white foods, fruits, veggies, does eat some fast food/eat out    Caffeine: none only hot chocolate or soda when stomach is upset   Water: 8 cups daily if not more      Wears seat belt   Smoke detectors and carbon monoxide detectors at home    Does not use phone while driving                      Social Determinants of Radio broadcast assistant Strain: Not on file  Food Insecurity: Not on file  Transportation Needs: Not on file  Physical Activity: Not on file  Stress: Not on file  Social Connections: Not on file    Tobacco Counseling Counseling given: Not Answered   Clinical Intake:                 Diabetic?yes  Nutrition Risk Assessment:  Has the patient had any N/V/D within the last 2 months?  No  Does the patient have any non-healing wounds?  No  Has the patient had any unintentional weight loss or weight gain?  No   Diabetes:  Is the patient diabetic?  Yes  If diabetic, was a CBG obtained today?  No  Did the patient bring in their glucometer from home?  No  How often do you monitor your CBG's? constantly.   Financial Strains and Diabetes Management:  Are you having any financial strains with the device, your supplies or your medication? No .  Does the patient want to be seen by Chronic Care Management for management of their diabetes?  No  Would the patient like to be referred to a Nutritionist or for Diabetic Management?  No   Diabetic Exams:  Diabetic Eye Exam: Overdue for diabetic eye exam. Pt has been advised about the importance in  completing this exam. Patient advised to call and schedule an eye exam. Diabetic Foot Exam: Completed 05/13/2021           Activities of Daily Living    01/31/2021    9:56 AM 01/31/2021    9:52 AM  In your present state of health, do you have any difficulty performing the following activities:  Hearing?  0  Vision?  0  Difficulty concentrating or making decisions?  0  Walking or climbing stairs?  0  Dressing or bathing?  0  Doing errands, shopping? 0     Patient Care Team: Renee Rival, FNP as PCP - General (Nurse Practitioner) Caren Macadam, MD as PCP - Family Medicine (Family Medicine) Eloise Harman, DO as Consulting Physician (Gastroenterology) Beryle Lathe, [facility] (Inactive) (Pharmacist)  Indicate any recent Medical Services you may have received from other than [facility] providers in the past year (date may be approximate).     Assessment:   This is a routine wellness examination for Robertlee.  Hearing/Vision screen No results found.  Dietary issues and exercise activities discussed:     Goals Addressed   None    Depression Screen    05/13/2021   10:20 AM 04/11/2021    8:38 AM 02/10/2021   11:28 AM 01/02/2021    7:54 AM 12/20/2020    8:27 AM 11/22/2020    4:24 PM  10/25/2020   11:30 AM  PHQ 2/9 Scores  PHQ - 2 Score 1 0 2 0 0 6 3  PHQ- 9 Score   6 0 0 17 9    Fall Risk    05/13/2021   10:19 AM 04/11/2021    8:38 AM 02/10/2021   11:28 AM 01/02/2021    7:54 AM 12/20/2020    8:27 AM  Fall Risk   Falls in the past year? 1 0 0 0 0  Number falls in past yr: 0 0 0 0 0  Injury with Fall? 1 0 0 0 0  Comment back neck and arms      Risk for fall due to : History of fall(s) No Fall Risks No Fall Risks No Fall Risks No Fall Risks  Follow up _0     FALL RISK PREVENTION PERTAINING TO THE HOME:  Any stairs in or around the  home? Yes  If so, are there any without handrails? Yes  Home free of loose throw rugs in walkways, pet beds, electrical cords, etc? Yes  Adequate lighting in your home to reduce risk of falls? Yes   ASSISTIVE DEVICES UTILIZED TO PREVENT FALLS:  Life alert? No  Use of a cane, walker or w/c? No  Grab bars in the bathroom? No  Shower chair or bench in shower? No  Elevated toilet seat or a handicapped toilet? No   TIMED UP AND GO:  Was the test performed? No .  Length of time to ambulate 10 feet:  sec.     Cognitive Function:        07/18/2020   10:04 AM  6CIT Screen  What Year? 0 points  What month? 0 points  What time? 0 points  Count back from 20 0 points    Immunizations Immunization History  Administered Date(s) Administered   Influenza,inj,quad, With Preservative 12/16/2018   Moderna SARS-COV2 Booster Vaccination 04/30/2020   Moderna Sars-Covid-2 Vaccination 09/20/2019, 10/18/2019    TDAP status: Due, Education has been provided regarding the importance of this vaccine. Advised may receive this vaccine at local pharmacy or Health Dept. Aware to provide a copy of the vaccination record if obtained from local pharmacy or Health Dept. Verbalized acceptance and understanding.  Flu Vaccine status: Up to date  Pneumococcal vaccine status: Due, Education has been provided regarding the importance of this vaccine. Advised may receive this vaccine at local pharmacy or Health Dept. Aware to provide a copy of the vaccination record if obtained from local pharmacy or Health Dept. Verbalized acceptance and understanding.  Covid-19 vaccine status: Information provided on how to obtain vaccines.   Qualifies for Shingles Vaccine? Yes   Zostavax completed No   Shingrix Completed?: No.    Education has been provided regarding the importance of this vaccine. Patient has been advised to call insurance company to determine out of pocket expense if they have not yet received this  vaccine. Advised may also receive vaccine at local pharmacy or Health Dept. Verbalized acceptance and understanding.  Screening Tests Health Maintenance  Topic Date Due   OPHTHALMOLOGY EXAM  Never done   TETANUS/TDAP  Never done   Zoster Vaccines- Shingrix (1 of 2) Never done   INFLUENZA VACCINE  09/23/2021   HEMOGLOBIN A1C  10/30/2021   FOOT EXAM  05/14/2022   COLONOSCOPY (Pts 45-80yr Insurance coverage will need to be confirmed)  02/04/2031   Hepatitis C Screening  Completed   HIV Screening  Completed   HPV VACCINES  Aged Out   COVID-19 Vaccine  Discontinued    Health Maintenance  Health Maintenance Due  Topic Date Due   OPHTHALMOLOGY EXAM  Never done   TETANUS/TDAP  Never done   Zoster Vaccines- Shingrix (1 of 2) Never done    Colorectal cancer screening: Type of screening: Colonoscopy. Completed 02/03/2021. Repeat every 10 years  Lung Cancer Screening: (Low Dose CT Chest recommended if Age 66-80 years, 30 pack-year currently smoking OR have quit w/in 15years.) does not qualify.   Lung Cancer Screening Referral:   Additional Screening:  Hepatitis C Screening: does not qualify; Completed 04/06/2019  Vision Screening: Recommended annual ophthalmology exams for early detection of glaucoma and other disorders of the eye. Is the patient up to date with their annual eye exam?  No  Who is the provider or what is the name of the office in which the patient attends annual eye exams? MY eye doctor If pt is not established with a provider, would they like to be referred to a provider to establish care? No .   Dental Screening: Recommended annual dental exams for proper oral hygiene  Community Resource Referral / Chronic Care Management: CRR required this visit?  No   CCM required this visit?  No      Plan:     I have personally reviewed and noted the following in the patient's chart:   Medical and social history Use of alcohol, tobacco or illicit drugs  Current  medications and supplements including opioid prescriptions. Patient is not currently taking opioid prescriptions. Functional ability and status Nutritional status Physical activity Advanced directives List of other physicians Hospitalizations, surgeries, and ER visits in previous 12 months Vitals Screenings to include cognitive, depression, and falls Referrals and appointments  In addition, I have reviewed and discussed with patient certain preventive protocols, quality metrics, and best practice recommendations. A written personalized care plan for preventive services as well as general preventive health recommendations were provided to patient.     Colum Colt, [place]   07/28/2021   Nurse Notes:

## 2021-08-04 DIAGNOSIS — E119 Type 2 diabetes mellitus without complications: Secondary | ICD-10-CM | POA: Diagnosis not present

## 2021-08-05 ENCOUNTER — Telehealth: Payer: Self-pay

## 2021-08-05 NOTE — Telephone Encounter (Signed)
Patient called need more of his Dexcom G6 but our pharmacist no longer to help him get it covered with insurance. Please call patient back to let him know what he needs to do [phone]

## 2021-08-13 ENCOUNTER — Encounter: Payer: Self-pay | Admitting: Nurse Practitioner

## 2021-08-13 ENCOUNTER — Ambulatory Visit (INDEPENDENT_AMBULATORY_CARE_PROVIDER_SITE_OTHER): Payer: Medicare HMO | Admitting: Nurse Practitioner

## 2021-08-13 VITALS — BP 116/72 | HR 78 | Ht 68.0 in | Wt 228.0 lb

## 2021-08-13 DIAGNOSIS — E039 Hypothyroidism, unspecified: Secondary | ICD-10-CM

## 2021-08-13 DIAGNOSIS — E119 Type 2 diabetes mellitus without complications: Secondary | ICD-10-CM

## 2021-08-13 DIAGNOSIS — F339 Major depressive disorder, recurrent, unspecified: Secondary | ICD-10-CM | POA: Diagnosis not present

## 2021-08-13 DIAGNOSIS — Z794 Long term (current) use of insulin: Secondary | ICD-10-CM | POA: Diagnosis not present

## 2021-08-13 DIAGNOSIS — E785 Hyperlipidemia, unspecified: Secondary | ICD-10-CM

## 2021-08-13 DIAGNOSIS — I10 Essential (primary) hypertension: Secondary | ICD-10-CM | POA: Diagnosis not present

## 2021-08-13 DIAGNOSIS — E1169 Type 2 diabetes mellitus with other specified complication: Secondary | ICD-10-CM

## 2021-08-13 NOTE — Patient Instructions (Signed)
Please consider getting your TDAP vaccine and shingles vaccine at your pharmacy.  Please schedule your diabetic eye exam today   It is important that you exercise regularly at least 30 minutes 5 times a week.  Think about what you will eat, plan ahead. Choose " clean, green, fresh or frozen" over canned, processed or packaged foods which are more sugary, salty and fatty. 70 to 75% of food eaten should be vegetables and fruit. Three meals at set times with snacks allowed between meals, but they must be fruit or vegetables. Aim to eat over a 12 hour period , example 7 am to 7 pm, and STOP after  your last meal of the day. Drink water,generally about 64 ounces per day, no other drink is as healthy. Fruit juice is best enjoyed in a healthy way, by EATING the fruit.  Thanks for choosing [facility], we consider it a privelige to serve you.

## 2021-08-13 NOTE — Assessment & Plan Note (Signed)
Currently on simvastatin 40 mg daily Omega-3 fatty acid 1 g twice daily Check fasting labs today LDL goal is less than 70

## 2021-08-13 NOTE — Assessment & Plan Note (Addendum)
Chronic condition well-controlled on duloxetine 60 mg daily Continue current medication PHQ-9 score is 6, denies SI, HI

## 2021-08-13 NOTE — Assessment & Plan Note (Signed)
BP Readings from Last 3 Encounters:  08/13/21 116/72  05/13/21 138/78  02/10/21 124/77  Chronic condition well-controlled on lisinopril 10 mg daily Continue current medication DASH diet advised Engage in regular daily exercises at least for 50 minutes weekly as tolerated

## 2021-08-13 NOTE — Assessment & Plan Note (Addendum)
Chronic condition currently on metformin 1000 mg twice daily, Trulicity 1.5 mg once weekly injection.  He has stopped taking Tresiba 100 units since the past 2 months due to hypoglycemia Denies hypoglycemic episode since he stopped [place] Referral sent for diabetic eye exam today Check A1c, urine creatinine labs Will increase the dose of Trulicity if needed after labs are back

## 2021-08-13 NOTE — Progress Notes (Signed)
   Thomas Rollins     MRN: [id number]      DOB: 08-07-1960   HPI Thomas Rollins with past medical history of essential hypertension, diabetes type 2, hyperlipidemia, hypothyroidism, depression, is here for follow up and re-evaluation of chronic medical conditions, medication management and review of any available recent lab and radiology data.   Type 2 diabetes .patient stated that he has stopped taking tresiba 100 units since 2 months ago, due to hypoglycemia, currently taking trulicity 1.5mg  once weekly injection, metformin 1000mg  BID.  Patient stated that Dexcom 6 no longer covered by his insurance he would like to be switched to another model of dexcom that would be covered by his insurance.   Due for Tdap and shingles vaccine need for both vaccines discussed with patient patient encouraged to get both vaccines at his pharmacy.  ROS Denies recent fever or chills. Denies sinus pressure, nasal congestion, ear pain or sore throat. Denies chest congestion, productive cough or wheezing. Denies chest pains, palpitations and leg swelling Denies abdominal pain, nausea, vomiting,diarrhea or constipation.   Denies dysuria, frequency, hesitancy or incontinence. Denies headaches, seizures, numbness, or tingling. Denies depression, anxiety or insomnia.    PE  BP 116/72 (BP Location: Right Arm, Patient Position: Sitting, Cuff Size: Large)   Pulse 78   Ht 5\' 8"  (1.727 m)   Wt 228 lb (103.4 kg)   SpO2 97%   BMI 34.67 kg/m   Patient alert and oriented and in no cardiopulmonary distress.  Chest: Clear to auscultation bilaterally.  CVS: S1, S2 no murmurs, no S3.Regular rate.  ABD: Soft non tender.   Ext: No edema  MS: Adequate ROM spine, shoulders, hips and knees.  Psych: Good eye contact, normal affect. Memory intact not anxious or depressed appearing.  CNS: CN 2-12 intact, power,  normal throughout.no focal deficits noted.   Assessment & Plan  Essential hypertension BP Readings from  Last 3 Encounters:  08/13/21 116/72  05/13/21 138/78  02/10/21 124/77  Chronic condition well-controlled on lisinopril 10 mg daily Continue current medication DASH diet advised Engage in regular daily exercises at least for 50 minutes weekly as tolerated  Diabetes mellitus type 2, insulin dependent (HCC) Chronic condition currently on metformin 1000 mg twice daily, Trulicity 1.5 mg once weekly injection.  He has stopped taking Tresiba 100 units since the past 2 months due to hypoglycemia Denies hypoglycemic episode since he stopped 05/15/21 Referral sent for diabetic eye exam today Check A1c, urine creatinine labs Will increase the dose of Trulicity if needed after labs are back  Hyperlipidemia associated with type 2 diabetes mellitus (HCC) Currently on simvastatin 40 mg daily Omega-3 fatty acid 1 g twice daily Check fasting labs today LDL goal is less than 70  Hypothyroidism Currently on levothyroxine 137 mcg tablet 1 tablet daily Check TSH  Depression, recurrent (HCC) Chronic condition well-controlled on duloxetine 60 mg daily Continue current medication PHQ-9 score is 6, denies SI, HI

## 2021-08-13 NOTE — Assessment & Plan Note (Signed)
Currently on levothyroxine 137 mcg tablet 1 tablet daily Check TSH

## 2021-08-14 ENCOUNTER — Other Ambulatory Visit: Payer: Self-pay | Admitting: Nurse Practitioner

## 2021-08-14 DIAGNOSIS — E039 Hypothyroidism, unspecified: Secondary | ICD-10-CM

## 2021-08-14 MED ORDER — LEVOTHYROXINE SODIUM 112 MCG PO TABS: 112.0000 ug | ORAL_TABLET | Freq: Every day | ORAL | 3 refills | Status: DC

## 2021-08-15 LAB — MICROALBUMIN / CREATININE URINE RATIO
Creatinine, Urine: 196.7 mg/dL
Microalb/Creat Ratio: 4 mg/g creat (ref 0–29)
Microalbumin, Urine: 8.8 ug/mL

## 2021-08-15 LAB — LIPID PANEL
Chol/HDL Ratio: 2.8 ratio (ref 0.0–5.0)
Cholesterol, Total: 112 mg/dL (ref 100–199)
HDL: 40 mg/dL (ref >39)
LDL Chol Calc (NIH): 46 mg/dL (ref 0–99)
Triglycerides: 156 mg/dL — ABNORMAL HIGH (ref 0–149)
VLDL Cholesterol Cal: 26 mg/dL (ref 5–40)

## 2021-08-15 LAB — HEMOGLOBIN A1C
Est. average glucose Bld gHb Est-mCnc: 148 mg/dL
Hgb A1c MFr Bld: 6.8 % — ABNORMAL HIGH (ref 4.8–5.6)

## 2021-08-15 LAB — TSH: TSH: 0.142 u[IU]/mL — ABNORMAL LOW (ref 0.450–4.500)

## 2021-08-25 ENCOUNTER — Other Ambulatory Visit: Payer: Self-pay | Admitting: Nurse Practitioner

## 2021-08-25 ENCOUNTER — Telehealth: Payer: Self-pay | Admitting: Nurse Practitioner

## 2021-08-25 DIAGNOSIS — R112 Nausea with vomiting, unspecified: Secondary | ICD-10-CM

## 2021-08-25 MED ORDER — METOCLOPRAMIDE HCL 5 MG PO TABS: 5.0000 mg | ORAL_TABLET | Freq: Three times a day (TID) | ORAL | 0 refills | Status: DC | PRN

## 2021-08-25 NOTE — Telephone Encounter (Signed)
Spoke with pt not showing any nausea medication on current med list. Pt states that it was order for him last year and he gets it quit frequently. Please advise.

## 2021-08-25 NOTE — Telephone Encounter (Signed)
Pt called stating phar did not receive the rx for his vomiting medication. Can you please resend? Does not know the name of the medication?     [facility]

## 2021-08-27 DIAGNOSIS — M4722 Other spondylosis with radiculopathy, cervical region: Secondary | ICD-10-CM | POA: Diagnosis not present

## 2021-08-27 DIAGNOSIS — E1140 Type 2 diabetes mellitus with diabetic neuropathy, unspecified: Secondary | ICD-10-CM | POA: Diagnosis not present

## 2021-08-27 NOTE — Telephone Encounter (Signed)
Called pt to make sure he got his medication no answer left vm

## 2021-08-28 NOTE — Telephone Encounter (Signed)
Called pt no answer left vm 

## 2021-08-29 ENCOUNTER — Telehealth: Payer: Self-pay | Admitting: Nurse Practitioner

## 2021-08-29 NOTE — Telephone Encounter (Signed)
Pt returning call

## 2021-08-29 NOTE — Telephone Encounter (Signed)
Spoke with pt he received meds

## 2021-08-29 NOTE — Telephone Encounter (Signed)
Spoke with pt

## 2021-09-04 ENCOUNTER — Other Ambulatory Visit: Payer: Self-pay | Admitting: Internal Medicine

## 2021-09-04 DIAGNOSIS — E119 Type 2 diabetes mellitus without complications: Secondary | ICD-10-CM | POA: Diagnosis not present

## 2021-09-04 DIAGNOSIS — E039 Hypothyroidism, unspecified: Secondary | ICD-10-CM

## 2021-09-15 ENCOUNTER — Other Ambulatory Visit: Payer: Self-pay | Admitting: Nurse Practitioner

## 2021-09-25 ENCOUNTER — Ambulatory Visit: Payer: Medicare HMO | Admitting: Nurse Practitioner

## 2021-10-01 ENCOUNTER — Encounter: Payer: Self-pay | Admitting: Nurse Practitioner

## 2021-10-01 ENCOUNTER — Ambulatory Visit (INDEPENDENT_AMBULATORY_CARE_PROVIDER_SITE_OTHER): Payer: Medicare HMO | Admitting: Nurse Practitioner

## 2021-10-01 VITALS — BP 103/69 | HR 90 | Ht 67.0 in | Wt 218.0 lb

## 2021-10-01 DIAGNOSIS — Z794 Long term (current) use of insulin: Secondary | ICD-10-CM

## 2021-10-01 DIAGNOSIS — E119 Type 2 diabetes mellitus without complications: Secondary | ICD-10-CM | POA: Diagnosis not present

## 2021-10-01 DIAGNOSIS — E039 Hypothyroidism, unspecified: Secondary | ICD-10-CM | POA: Diagnosis not present

## 2021-10-01 DIAGNOSIS — I10 Essential (primary) hypertension: Secondary | ICD-10-CM | POA: Diagnosis not present

## 2021-10-01 NOTE — Progress Notes (Signed)
   [name]     MRN: [id number]      DOB: 29-Jan-1961   HPI Mr. Thomas Rollins with PMH of hypothyroidism , T2DM, HLD, Arthritis,is here for follow up for hypothyroidism . States that he has been mistakenly taking levothyroxine daily together  with levothyroxine daily until yesterday when he realized his mistake. He denies palpitations, heat sensitivity , diarrhea, nervousness     ROS Denies recent fever or chills. Denies sinus pressure, nasal congestion, ear pain or sore throat. Denies chest congestion, productive cough or wheezing. Denies chest pains, palpitations and leg swelling Denies abdominal pain, nausea, vomiting,diarrhea or constipation.   Denies dysuria, frequency, hesitancy or incontinence. Denies headaches, seizures, numbness, or tingling.[name]   PE  BP 103/69 (BP Location: Left Arm, Patient Position: Sitting, Cuff Size: Normal)   Pulse 90   Ht 5\' 7"  (1.702 m)   Wt 218 lb (98.9 kg)   SpO2 97%   BMI 34.14 kg/m   Patient alert and oriented and in no cardiopulmonary distress.  HEENT: No facial asymmetry, EOMI,     Neck supple .  Chest: Clear to auscultation bilaterally.  CVS: S1, S2 no murmurs, no S3.Regular rate.  ABD: Soft non tender.   Ext: No edema  MS: Adequate ROM spine, shoulders, hips and knees.  Psych: Good eye contact, normal affect. Memory intact not anxious or depressed appearing.  CNS: intact, power,  normal throughout.no focal deficits noted.   Assessment & Plan Hypothyroidism Has been taking both levothyroxine with He realized his mistake yesterday.  Started taking only the daily dose today    He denies palpitations, heat sensitivity , diarrhea, nervousness No labs today , will recheck labs in 6 weeks.   Essential hypertension BP Readings from Last 3 Encounters:  10/01/21 103/69  08/13/21 116/72  05/13/21 138/78  chronic condition welll controlled  Continue lisinopril 10mg  daily  DASH diet advised,  engage in regular daily exercises at least 150 minutes weekly .   Diabetes mellitus type 2, insulin dependent (HCC) Lab Results  Component Value Date   HGBA1C 6.8 (H) 08/13/2021  chronic condition well controled  On trulicity 1.5mg  once weekly injection, metformin 1000 mg BID  continue cyurrent meds Avoid sugar , sweet , soda.  Patient encouraged to get his diabetic eye exam done  ,

## 2021-10-01 NOTE — Patient Instructions (Signed)
Please start taking levothyroxine daily, please get labs done 3-5 days before upcoming appointment     It is important that you exercise regularly at least 30 minutes 5 times a week.  Think about what you will eat, plan ahead. Choose " clean, green, fresh or frozen" over canned, processed or packaged foods which are more sugary, salty and fatty. 70 to 75% of food eaten should be vegetables and fruit. Three meals at set times with snacks allowed between meals, but they must be fruit or vegetables. Aim to eat over a 12 hour period , example 7 am to 7 pm, and STOP after  your last meal of the day. Drink water,generally about 64 ounces per day, no other drink is as healthy. Fruit juice is best enjoyed in a healthy way, by EATING the fruit.  Thanks for choosing [facility], we consider it a privelige to serve you.

## 2021-10-01 NOTE — Assessment & Plan Note (Signed)
Has been taking both levothyroxine with He realized his mistake yesterday.  Started taking only the daily dose today    He denies palpitations, heat sensitivity , diarrhea, nervousness No labs today , will recheck labs in 6 weeks.

## 2021-10-01 NOTE — Assessment & Plan Note (Signed)
BP Readings from Last 3 Encounters:  10/01/21 103/69  08/13/21 116/72  05/13/21 138/78  chronic condition welll controlled  Continue lisinopril 10mg  daily  DASH diet advised, engage in regular daily exercises at least 150 minutes weekly .

## 2021-10-01 NOTE — Assessment & Plan Note (Signed)
Lab Results  Component Value Date   HGBA1C 6.8 (H) 08/13/2021  chronic condition well controled  On trulicity 1.5mg  once weekly injection, metformin 1000 mg BID  continue cyurrent meds Avoid sugar , sweet , soda.  Patient encouraged to get his diabetic eye exam done  ,

## 2021-10-03 ENCOUNTER — Other Ambulatory Visit: Payer: Self-pay

## 2021-10-03 ENCOUNTER — Telehealth: Payer: Self-pay | Admitting: Nurse Practitioner

## 2021-10-03 DIAGNOSIS — E039 Hypothyroidism, unspecified: Secondary | ICD-10-CM

## 2021-10-03 MED ORDER — LEVOTHYROXINE SODIUM 112 MCG PO TABS: 112.0000 ug | ORAL_TABLET | Freq: Every day | ORAL | 3 refills | Status: DC

## 2021-10-03 NOTE — Telephone Encounter (Signed)
Patient needs refill on  levothyroxine (SYNTHROID) 112 MCG tablet  

## 2021-10-03 NOTE — Telephone Encounter (Signed)
Rx sent 

## 2021-10-04 ENCOUNTER — Other Ambulatory Visit: Payer: Self-pay | Admitting: Internal Medicine

## 2021-10-04 DIAGNOSIS — I10 Essential (primary) hypertension: Secondary | ICD-10-CM

## 2021-10-05 ENCOUNTER — Other Ambulatory Visit: Payer: Self-pay | Admitting: Internal Medicine

## 2021-10-05 DIAGNOSIS — G4700 Insomnia, unspecified: Secondary | ICD-10-CM

## 2021-10-05 DIAGNOSIS — E119 Type 2 diabetes mellitus without complications: Secondary | ICD-10-CM | POA: Diagnosis not present

## 2021-10-06 ENCOUNTER — Other Ambulatory Visit: Payer: Self-pay | Admitting: Nurse Practitioner

## 2021-10-06 DIAGNOSIS — I10 Essential (primary) hypertension: Secondary | ICD-10-CM

## 2021-10-06 MED ORDER — LISINOPRIL 10 MG PO TABS: 10.0000 mg | ORAL_TABLET | Freq: Every day | ORAL | 6 refills | Status: DC

## 2021-10-13 ENCOUNTER — Telehealth: Payer: Self-pay

## 2021-10-13 NOTE — Telephone Encounter (Signed)
Patient called need all meter and test strips sent to pharmacy;    Continuous Blood Gluc Receiver (DEXCOM G6 RECEIVER) DEVI   Continuous Blood Gluc Sensor (DEXCOM G6 SENSOR) MISC   blood glucose meter kit and supplies  Pharmacy: [facility]

## 2021-10-15 ENCOUNTER — Other Ambulatory Visit: Payer: Self-pay

## 2021-10-15 DIAGNOSIS — E119 Type 2 diabetes mellitus without complications: Secondary | ICD-10-CM

## 2021-10-15 MED ORDER — UNABLE TO FIND: 2 refills | Status: DC

## 2021-10-15 MED ORDER — UNABLE TO FIND: 0 refills | Status: DC

## 2021-10-15 NOTE — Telephone Encounter (Signed)
Spoke with pt he is just needing meter and supplies insurance will not cover dex com rx sent

## 2021-10-30 ENCOUNTER — Telehealth: Payer: Self-pay

## 2021-10-30 ENCOUNTER — Other Ambulatory Visit: Payer: Self-pay | Admitting: Nurse Practitioner

## 2021-10-30 DIAGNOSIS — M792 Neuralgia and neuritis, unspecified: Secondary | ICD-10-CM

## 2021-10-30 MED ORDER — GABAPENTIN 300 MG PO CAPS: 300.0000 mg | ORAL_CAPSULE | Freq: Two times a day (BID) | ORAL | 6 refills | Status: DC

## 2021-10-30 NOTE — Telephone Encounter (Signed)
Please advise 

## 2021-10-30 NOTE — Telephone Encounter (Signed)
Message sent to provider 

## 2021-10-30 NOTE — Telephone Encounter (Signed)
Patient called said the pharmacy has faxed twice and has not received anything back from provider. Need med refill  gabapentin (NEURONTIN) 300 MG capsule   [facility]

## 2021-10-31 NOTE — Telephone Encounter (Signed)
Medication sent to pharmacy  

## 2021-11-03 ENCOUNTER — Telehealth: Payer: Self-pay | Admitting: Nurse Practitioner

## 2021-11-03 NOTE — Telephone Encounter (Signed)
Pt called stating that the pain management he was going to has closed down as of 8/31. States he is wanting to know if he can be referred to [facility].     [facility]  [phone] Fx# [phone] Attn: Kathie Rhodes

## 2021-11-04 ENCOUNTER — Other Ambulatory Visit: Payer: Self-pay

## 2021-11-04 DIAGNOSIS — M1250 Traumatic arthropathy, unspecified site: Secondary | ICD-10-CM

## 2021-11-04 NOTE — Telephone Encounter (Signed)
Referral placed pt aware 

## 2021-11-04 NOTE — Telephone Encounter (Signed)
Please advise 

## 2021-11-05 ENCOUNTER — Other Ambulatory Visit: Payer: Self-pay | Admitting: Nurse Practitioner

## 2021-11-06 DIAGNOSIS — E039 Hypothyroidism, unspecified: Secondary | ICD-10-CM | POA: Diagnosis not present

## 2021-11-07 ENCOUNTER — Other Ambulatory Visit: Payer: Self-pay | Admitting: Nurse Practitioner

## 2021-11-07 DIAGNOSIS — E039 Hypothyroidism, unspecified: Secondary | ICD-10-CM

## 2021-11-07 LAB — TSH+FREE T4
Free T4: 1.1 ng/dL (ref 0.82–1.77)
TSH: 0.234 u[IU]/mL — ABNORMAL LOW (ref 0.450–4.500)

## 2021-11-07 MED ORDER — LEVOTHYROXINE SODIUM 100 MCG PO TABS: 100.0000 ug | ORAL_TABLET | Freq: Every day | ORAL | 3 refills | Status: DC

## 2021-11-07 NOTE — Progress Notes (Unsigned)
l °

## 2021-11-07 NOTE — Progress Notes (Signed)
Hypothyroidism is over corrected  please find out how much levothyroxine the patient is taking currently. He is supposed to be on levothyroxine 112 mcg daily , I know he had a mix up and was taking extra dose the last he was here . We need to adjust his dose but I need to know what he is taking currently .  Thanks

## 2021-11-12 ENCOUNTER — Encounter: Payer: Self-pay | Admitting: Nurse Practitioner

## 2021-11-12 ENCOUNTER — Ambulatory Visit (INDEPENDENT_AMBULATORY_CARE_PROVIDER_SITE_OTHER): Payer: Medicare HMO | Admitting: Nurse Practitioner

## 2021-11-12 VITALS — BP 106/69 | HR 78 | Resp 15 | Ht 68.0 in | Wt 216.0 lb

## 2021-11-12 DIAGNOSIS — E119 Type 2 diabetes mellitus without complications: Secondary | ICD-10-CM | POA: Diagnosis not present

## 2021-11-12 DIAGNOSIS — Z794 Long term (current) use of insulin: Secondary | ICD-10-CM

## 2021-11-12 DIAGNOSIS — M1250 Traumatic arthropathy, unspecified site: Secondary | ICD-10-CM | POA: Diagnosis not present

## 2021-11-12 DIAGNOSIS — E118 Type 2 diabetes mellitus with unspecified complications: Secondary | ICD-10-CM | POA: Diagnosis not present

## 2021-11-12 DIAGNOSIS — E039 Hypothyroidism, unspecified: Secondary | ICD-10-CM | POA: Diagnosis not present

## 2021-11-12 DIAGNOSIS — Z23 Encounter for immunization: Secondary | ICD-10-CM

## 2021-11-12 MED ORDER — LEVOTHYROXINE SODIUM 100 MCG PO TABS: 100.0000 ug | ORAL_TABLET | Freq: Every day | ORAL | 0 refills | Status: AC

## 2021-11-12 MED ORDER — TRULICITY 1.5 MG/0.5ML ~~LOC~~ SOAJ: SUBCUTANEOUS | 1 refills | Status: DC

## 2021-11-12 NOTE — Assessment & Plan Note (Signed)
Lab Results  Component Value Date   HGBA1C 6.8 (H) 08/13/2021   Currently on Trulicity 1.5 mg once weekly injection, metformin 1000 mg twice daily Trulicity refilled today  Continue current medications Patient denies hypoglycemia Patient was encouraged to get his diabetic eye exam done Avoid sugar sweets soda Check A1c at next visit

## 2021-11-12 NOTE — Progress Notes (Signed)
Established Patient Office Visit  Subjective:  Patient ID: Thomas Rollins, male    DOB: 1960-11-06  Age: 61 y.o. MRN: [id number]  CC:  Chief Complaint  Patient presents with   Hypothyroidism    6 wk follow up    HPI Thomas Rollins is a 61 y.o. male with past medical history of hypothyroidism, type 2 diabetes, hyperlipidemia, obstructive sleep apnea, depression, vitamin D deficiency presents for follow-up for hypothyroidism   Hypothyroidism. Takes 165mg daily current denies palpitation, unitentional weight loss, excessive sweating, palpitations.   Will be establishing care with a new pain management specialist, has upcoming appointment with them in October.   Due for flu vaccine flu vaccine given in the office today.  Past Medical History:  Diagnosis Date   Abnormal LFTs    Anxiety    panic attacks   Chronic pain    Depression    Diabetes mellitus without complication ([place]    Encounter for hepatitis C screening test for low risk patient 04/05/2019   Hyperlipidemia    Hypertension    Hypothyroidism    Sleep apnea    TIA (transient ischemic attack) 2019    Past Surgical History:  Procedure Laterality Date   ankle sx     ARTHROSCOPIC REPAIR ACL     CARPAL TUNNEL RELEASE     COLONOSCOPY WITH PROPOFOL N/A 02/03/2021   Procedure: COLONOSCOPY WITH PROPOFOL;  Surgeon: CEloise Harman DO;  Location: [facility];  Service: Endoscopy;  Laterality: N/A;  8:00am   HERNIA REPAIR     SHOULDER ARTHROSCOPY      Family History  Problem Relation Age of Onset   Stomach cancer Mother 54  Heart Problems Father    Dementia Father    Cancer Father        "tumors"   Bone cancer Brother    Lung cancer Brother    Cancer Daughter 28      Male cancer   Cancer Daughter 266      Male   Colon cancer Neg Hx     Social History   Socioeconomic History   Marital status: Widowed    Spouse name: Not on file   Number of children: 1   Years of education: Not on file    Highest education level: 12th grade  Occupational History   Not on file  Tobacco Use   Smoking status: Former    Types: Cigarettes   Smokeless tobacco: Never  Vaping Use   Vaping Use: Never used  Substance and Sexual Activity   Alcohol use: Not Currently    Comment: none in 1 yr 10/2020   Drug use: Yes    Types: Marijuana    Comment: occas   Sexual activity: Never  Other Topics Concern   Not on file  Social History Narrative   Lives with good friend of his, known for 377years    Only daughter passed away from cancer in S09-27-2018      Retired from 2004 in [employer]   Takes time release morphine b/c fell 15-20 feet from a building while working.    Now disabled        Exercises/walks: 7 days a week   Has pets at home: 5 dogs in the home.      Diet: Avoids white foods, fruits, veggies, does eat some fast food/eat out    Caffeine: none only hot chocolate or soda when stomach is upset  Water: 8 cups daily if not more      Wears seat belt   Smoke detectors and carbon monoxide detectors at home    Does not use phone while driving                      Social Determinants of Health   Financial Resource Strain: Low Risk  (07/18/2020)   Overall Financial Resource Strain (CARDIA)    Difficulty of Paying Living Expenses: Not very hard  Food Insecurity: No Food Insecurity (07/18/2020)   Hunger Vital Sign    Worried About Running Out of Food in the Last Year: Never true    Ran Out of Food in the Last Year: Never true  Transportation Needs: No Transportation Needs (07/18/2020)   PRAPARE - Hydrologist (Medical): No    Lack of Transportation (Non-Medical): No  Physical Activity: Sufficiently Active (07/18/2020)   Exercise Vital Sign    Days of Exercise per Week: 4 days    Minutes of Exercise per Session: 40 min  Stress: Stress Concern Present (07/18/2020)   [place]     Feeling of Stress : To some extent  Social Connections: Socially Isolated (07/18/2020)   Social Connection and Isolation Panel [NHANES]    Frequency of Communication with Friends and Family: Twice a week    Frequency of Social Gatherings with Friends and Family: Never    Attends Religious Services: Never    Marine scientist or Organizations: No    Attends Archivist Meetings: Never    Marital Status: Divorced  Human resources officer Violence: Not At Risk (07/18/2020)   Humiliation, Afraid, Rape, and Kick questionnaire    Fear of Current or Ex-Partner: No    Emotionally Abused: No    Physically Abused: No    Sexually Abused: No    Outpatient Medications Prior to Visit  Medication Sig Dispense Refill   ALPHAGAN P 0.1 % SOLN Place 1 drop into both eyes 2 (two) times daily as needed (irritation).     blood glucose meter kit and supplies Dispense based on patient and insurance preference. Use up to four times daily as directed. (FOR ICD-10 E10.9, E11.9). 1 each 0   Continuous Blood Gluc Receiver (DEXCOM G6 RECEIVER) DEVI Use to monitor glucose continuously     Continuous Blood Gluc Sensor (DEXCOM G6 SENSOR) MISC Change sensor every 10 days 3 each 11   Continuous Blood Gluc Transmit (DEXCOM G6 TRANSMITTER) MISC Use to monitor glucose continuously. Change every 90 days.     cyclobenzaprine (FLEXERIL) 10 MG tablet TAKE 1 TABLET BY MOUTH TWICE DAILY AS NEEDED FOR MUSCLE SPASMS AND pain 60 tablet 2   Diclofenac Sodium 3 % GEL Apply 1 g topically 4 (four) times daily as needed (pain).     DULoxetine (CYMBALTA) 60 MG capsule TAKE 1 CAPSULE BY MOUTH EVERY DAY 90 capsule 2   gabapentin (NEURONTIN) 300 MG capsule Take 1 capsule (300 mg total) by mouth 2 (two) times daily. 60 capsule 6   GLOBAL EASE INJECT PEN NEEDLES 31G X 8 MM MISC Use to inject insulin once daily 100 each 3   Glucagon (GVOKE HYPOPEN 2-PACK) 1 MG/0.2ML SOAJ Inject 1 mg into the skin daily as needed. 0.4 mL 0   lisinopril  (ZESTRIL) 10 MG tablet Take 1 tablet (10 mg total) by mouth daily. 30 tablet 6   meloxicam (MOBIC) 7.5 MG tablet  TAKE 1 TABLET BY MOUTH DAILY 30 tablet 10   metFORMIN (GLUCOPHAGE) 1000 MG tablet TAKE 1 TABLET BY MOUTH TWICE DAILY 180 tablet 1   metoCLOPramide (REGLAN) 5 MG tablet Take 1 tablet (5 mg total) by mouth every 8 (eight) hours as needed for nausea or vomiting. 20 tablet 0   morphine (MS CONTIN) 30 MG 12 hr tablet Take 1 tablet by mouth 2 (two) times daily. Take with the 41m      naloxone (NARCAN) nasal spray 4 mg/0.1 mL Place 1 spray into the nose as needed (opioid overdose).     Omega-3 Fatty Acids (FISH OIL) 1000 MG CAPS Take 2 capsules (2,000 mg total) by mouth in the morning and at bedtime. (Patient taking differently: Take 1 capsule by mouth every morning.) 360 capsule 3   simvastatin (ZOCOR) 40 MG tablet Take 1 tablet (40 mg total) by mouth every evening. 100 tablet 3   traZODone (DESYREL) 50 MG tablet TAKE ONE-HALF TO 1 TABLET BY MOUTH AT BEDTIME AS NEEDED FOR SLEEP 30 tablet 3   UNABLE TO FIND Blood glucose meter to check blood glucose daily DX:E11.9 1 each 0   UNABLE TO FIND Lancets  DX: E11.9 100 each 2   UNABLE TO FIND Blood glucose test strips used to check blood glucose daily DX: E11.9 100 each 2   levothyroxine (SYNTHROID) 112 MCG tablet Take 112 mcg by mouth daily before breakfast.     TRULICITY 1.5 MTD/1.7OHSOPN INJECT ONE PEN INTO THE SKIN ONCE A WEEK 6 mL 1   albuterol (VENTOLIN HFA) 108 (90 Base) MCG/ACT inhaler Inhale 2 puffs into the lungs every 6 (six) hours as needed for wheezing or shortness of breath. (Patient not taking: Reported on 07/28/2021) 8 g 0   AMBULATORY NON FORMULARY MEDICATION 1 vial by Intracavernosal route as needed. Medication Name:  Prostaglandin E1, 10 mcg vials.  Dispense 5 with 11 refills  Inject 1 vial as needed (Patient not taking: Reported on 08/13/2021) 5 vial 11   morphine (MS CONTIN) 60 MG 12 hr tablet Take 1 tablet by mouth 2 (two) times  daily. Takes with the 337m (Patient not taking: Reported on 11/12/2021)     omeprazole (PRILOSEC) 40 MG capsule Take 1 capsule (40 mg total) by mouth daily. (Patient not taking: Reported on 10/01/2021) 30 capsule 3   ONETOUCH ULTRA test strip USE UP TO FOUR TIMES DAILY AS DIRECTED (25 DAY supply) (Patient not taking: Reported on 08/13/2021) 100 strip 11   levothyroxine (SYNTHROID) 100 MCG tablet Take 1 tablet (100 mcg total) by mouth daily. (Patient not taking: Reported on 11/12/2021) 90 tablet 3   No facility-administered medications prior to visit.    Allergies  Allergen Reactions   Codeine Other (See Comments)    Slurred Speech    Other     [place] N/V   sweat potatoes - N/V    Penicillins Rash    ROS Review of Systems  Constitutional: Negative.   HENT: Negative.  Negative for congestion, drooling, sneezing, sore throat and tinnitus.   Respiratory:  Negative for cough, shortness of breath, wheezing and stridor.   Cardiovascular: Negative.  Negative for chest pain, palpitations and leg swelling.  Gastrointestinal: Negative.  Negative for abdominal distention, abdominal pain and anal bleeding.  Musculoskeletal:  Positive for arthralgias.        joint pain  in the elbows , hips, knees   Neurological: Negative.  Negative for dizziness, facial asymmetry and headaches.  Psychiatric/Behavioral:  Negative.  Negative for agitation, behavioral problems and confusion.       Objective:    Physical Exam Constitutional:      General: He is not in acute distress.    Appearance: He is obese. He is not ill-appearing, toxic-appearing or diaphoretic.  Cardiovascular:     Rate and Rhythm: Normal rate and regular rhythm.     Pulses: Normal pulses.     Heart sounds: Normal heart sounds. No murmur heard.    No friction rub. No gallop.  Pulmonary:     Effort: Pulmonary effort is normal. No respiratory distress.     Breath sounds: Normal breath sounds. No stridor. No wheezing, rhonchi or rales.   Chest:     Chest wall: No tenderness.  Abdominal:     Palpations: Abdomen is soft.     Tenderness: There is no abdominal tenderness.  Musculoskeletal:        General: Tenderness present. No swelling, deformity or signs of injury.     Right lower leg: No edema.     Left lower leg: No edema.     Comments: Bilateral hips and elbows , able to ambulate with steady gait , no swelling noted.   Skin:    General: Skin is warm and dry.  Neurological:     Mental Status: He is alert.     Cranial Nerves: No cranial nerve deficit.     Motor: No weakness.     Gait: Gait normal.  Psychiatric:        Mood and Affect: Mood normal.        Thought Content: Thought content normal.        Judgment: Judgment normal.     BP 106/69   Pulse 78   Resp 15   Ht _0  (1.727 m)   Wt 216 lb (98 kg)   SpO2 98%   BMI 32.84 kg/m  Wt Readings from Last 3 Encounters:  11/12/21 216 lb (98 kg)  10/01/21 218 lb (98.9 kg)  08/13/21 228 lb (103.4 kg)    Lab Results  Component Value Date   TSH 0.234 (L) 11/06/2021   Lab Results  Component Value Date   WBC 5.8 04/29/2021   HGB 14.8 04/29/2021   HCT 43.1 04/29/2021   MCV 85 04/29/2021   PLT 187 04/29/2021   Lab Results  Component Value Date   NA 140 04/29/2021   K 4.6 04/29/2021   CO2 22 04/29/2021   GLUCOSE 135 (H) 04/29/2021   BUN 18 04/29/2021   CREATININE 0.85 04/29/2021   BILITOT 0.3 04/29/2021   ALKPHOS 60 04/29/2021   AST 30 04/29/2021   ALT 36 04/29/2021   PROT 6.8 04/29/2021   ALBUMIN 4.6 04/29/2021   CALCIUM 9.6 04/29/2021   ANIONGAP 8 01/31/2021   EGFR 98 04/29/2021   Lab Results  Component Value Date   CHOL 112 08/13/2021   Lab Results  Component Value Date   HDL 40 08/13/2021   Lab Results  Component Value Date   LDLCALC 46 08/13/2021   Lab Results  Component Value Date   TRIG 156 (H) 08/13/2021   Lab Results  Component Value Date   CHOLHDL 2.8 08/13/2021   Lab Results  Component Value Date   HGBA1C 6.8  (H) 08/13/2021      Assessment & Plan:   Problem List Items Addressed This Visit       Endocrine   Controlled diabetes mellitus type 2 with complications ([place])  Lab Results  Component Value Date   HGBA1C 6.8 (H) 08/13/2021  Currently on Trulicity 1.5 mg once weekly injection, metformin 1000 mg twice daily Trulicity refilled today  Continue current medications Patient denies hypoglycemia Patient was encouraged to get his diabetic eye exam done Avoid sugar sweets soda Check A1c at next visit      Relevant Medications   Dulaglutide (TRULICITY) 1.5 HP/7.9GI SOPN   Hypothyroidism - Primary    Lab Results  Component Value Date   TSH 0.234 (L) 11/06/2021  currently on levothyroxine 112 mcg daily  Start levothyroxine 100 mcg daily  follow up in 4 weeks       Relevant Medications   levothyroxine (SYNTHROID) 100 MCG tablet     Musculoskeletal and Integument   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 Verbal consent was obtained from the patient, vaccine administered by nurse, no sign of adverse reactions noted at this time. Patient education on arm soreness and use of tylenol  for this patient  was discussed. Patient educated on the signs and symptoms of adverse effect and advise to contact the office if they occur. Vaccine information sheet given to patient.       Relevant Orders   Flu Vaccine QUAD 51moIM (Fluarix, Fluzone & Alfiuria Quad PF) (Completed)    Meds ordered this encounter  Medications   levothyroxine (SYNTHROID) 100 MCG tablet    Sig: Take 1 tablet (100 mcg total) by mouth daily.    Dispense:  90 tablet    Refill:  0   Dulaglutide (TRULICITY) 1.5 MLU/0.0YHSOPN    Sig: INJECT ONE PEN INTO THE SKIN ONCE A WEEK    Dispense:  6 mL     Refill:  1    This prescription was filled on 04/18/2021. Any refills authorized will be placed on file.    Follow-up: Return in about 4 weeks (around 12/10/2021) for hypothyroidism .    FRenee Rival FNP

## 2021-11-12 NOTE — Assessment & Plan Note (Signed)
Patient educated on CDC recommendation for the vaccine. Verbal consent was obtained from the patient, vaccine administered by nurse, no sign of adverse reactions noted at this time. Patient education on arm soreness and use of tylenol  for this patient  was discussed. Patient educated on the signs and symptoms of adverse effect and advise to contact the office if they occur. Vaccine information sheet given to patient.  

## 2021-11-12 NOTE — Assessment & Plan Note (Signed)
Chronic condition Narcotics managed by pain management, has an upcoming appointment with new pain management specialist

## 2021-11-12 NOTE — Patient Instructions (Addendum)
Please start taking levothyroxine 100 mcg daily.   Please get your shingles vaccine and TDAP vaccines at the pharmacy.   Flu Vaccine today    It is important that you exercise regularly at least 30 minutes 5 times a week.  Think about what you will eat, plan ahead. Choose " clean, green, fresh or frozen" over canned, processed or packaged foods which are more sugary, salty and fatty. 70 to 75% of food eaten should be vegetables and fruit. Three meals at set times with snacks allowed between meals, but they must be fruit or vegetables. Aim to eat over a 12 hour period , example 7 am to 7 pm, and STOP after  your last meal of the day. Drink water,generally about 64 ounces per day, no other drink is as healthy. Fruit juice is best enjoyed in a healthy way, by EATING the fruit.  Thanks for choosing [facility], we consider it a privelige to serve you.

## 2021-11-12 NOTE — Assessment & Plan Note (Addendum)
Lab Results  Component Value Date   TSH 0.234 (L) 11/06/2021  currently on levothyroxine 112 mcg daily  Start levothyroxine 100 mcg daily  follow up in 4 weeks

## 2021-11-14 ENCOUNTER — Telehealth: Payer: Self-pay | Admitting: Nurse Practitioner

## 2021-11-14 NOTE — Telephone Encounter (Signed)
Spoke with pharmacy they said that he pick up levothyrozine 100 mcg on 9/15 pt states that they do not have rx and it was just prescribe to him 9/20 advised to pt that if he doesn't have any to contact pharmacy

## 2021-11-14 NOTE — Telephone Encounter (Signed)
Thomas Rollins still has not received script for  levothyroxine (SYNTHROID) 100 MCG tablet  Patient needs meds today to fill for week

## 2021-11-25 DIAGNOSIS — M542 Cervicalgia: Secondary | ICD-10-CM | POA: Diagnosis not present

## 2021-11-25 DIAGNOSIS — G894 Chronic pain syndrome: Secondary | ICD-10-CM | POA: Diagnosis not present

## 2021-11-25 DIAGNOSIS — M2550 Pain in unspecified joint: Secondary | ICD-10-CM | POA: Diagnosis not present

## 2021-11-25 DIAGNOSIS — M545 Low back pain, unspecified: Secondary | ICD-10-CM | POA: Diagnosis not present

## 2021-12-09 ENCOUNTER — Telehealth: Payer: Self-pay | Admitting: Neurology

## 2021-12-09 DIAGNOSIS — E119 Type 2 diabetes mellitus without complications: Secondary | ICD-10-CM | POA: Diagnosis not present

## 2021-12-09 DIAGNOSIS — E1169 Type 2 diabetes mellitus with other specified complication: Secondary | ICD-10-CM | POA: Diagnosis not present

## 2021-12-09 DIAGNOSIS — Z794 Long term (current) use of insulin: Secondary | ICD-10-CM | POA: Diagnosis not present

## 2021-12-09 DIAGNOSIS — E039 Hypothyroidism, unspecified: Secondary | ICD-10-CM | POA: Diagnosis not present

## 2021-12-09 DIAGNOSIS — E785 Hyperlipidemia, unspecified: Secondary | ICD-10-CM | POA: Diagnosis not present

## 2021-12-09 NOTE — Telephone Encounter (Signed)
Pt called wanting to know if the RN can call him to see if he would be a candidate for the [facility]. Please advise.

## 2021-12-09 NOTE — Telephone Encounter (Signed)
Pt has not been seen since 03/04/2020. Has been over a yr since last appt. He will need to be seen first to discuss. Please call pt back to schedule appt.

## 2021-12-10 ENCOUNTER — Telehealth: Payer: Self-pay | Admitting: Internal Medicine

## 2021-12-10 LAB — LIPID PANEL
Chol/HDL Ratio: 3.1 ratio (ref 0.0–5.0)
Cholesterol, Total: 135 mg/dL (ref 100–199)
HDL: 43 mg/dL (ref >39)
LDL Chol Calc (NIH): 72 mg/dL (ref 0–99)
Triglycerides: 108 mg/dL (ref 0–149)
VLDL Cholesterol Cal: 20 mg/dL (ref 5–40)

## 2021-12-10 LAB — CMP14+EGFR
ALT: 28 IU/L (ref 0–44)
AST: 22 IU/L (ref 0–40)
Albumin/Globulin Ratio: 2.1 (ref 1.2–2.2)
Albumin: 5 g/dL — ABNORMAL HIGH (ref 3.9–4.9)
Alkaline Phosphatase: 77 IU/L (ref 44–121)
BUN/Creatinine Ratio: 18 (ref 10–24)
BUN: 16 mg/dL (ref 8–27)
Bilirubin Total: 0.5 mg/dL (ref 0.0–1.2)
CO2: 20 mmol/L (ref 20–29)
Calcium: 9.8 mg/dL (ref 8.6–10.2)
Chloride: 101 mmol/L (ref 96–106)
Creatinine, Ser: 0.89 mg/dL (ref 0.76–1.27)
Globulin, Total: 2.4 g/dL (ref 1.5–4.5)
Glucose: 231 mg/dL — ABNORMAL HIGH (ref 70–99)
Potassium: 4.7 mmol/L (ref 3.5–5.2)
Sodium: 140 mmol/L (ref 134–144)
Total Protein: 7.4 g/dL (ref 6.0–8.5)
eGFR: 96 mL/min/{1.73_m2} (ref >59)

## 2021-12-10 LAB — HEMOGLOBIN A1C
Est. average glucose Bld gHb Est-mCnc: 169 mg/dL
Hgb A1c MFr Bld: 7.5 % — ABNORMAL HIGH (ref 4.8–5.6)

## 2021-12-10 LAB — TSH+FREE T4
Free T4: 1.24 ng/dL (ref 0.82–1.77)
TSH: 0.915 u[IU]/mL (ref 0.450–4.500)

## 2021-12-10 NOTE — Telephone Encounter (Signed)
Patient called in regard to  Dulaglutide (TRULICITY) 1.5 EP/3.2RJ SOPN   States that it is time to up[ dosage and wants new script sent in to [facility].   Wants a call back when sent in so he can pick up today

## 2021-12-11 ENCOUNTER — Other Ambulatory Visit: Payer: Self-pay | Admitting: Internal Medicine

## 2021-12-11 DIAGNOSIS — E118 Type 2 diabetes mellitus with unspecified complications: Secondary | ICD-10-CM

## 2021-12-11 MED ORDER — TRULICITY 3 MG/0.5ML ~~LOC~~ SOAJ: 3.0000 mg | SUBCUTANEOUS | 0 refills | Status: DC

## 2021-12-11 NOTE — Telephone Encounter (Signed)
Made pt aware

## 2021-12-16 ENCOUNTER — Encounter: Payer: Medicare HMO | Admitting: Nurse Practitioner

## 2021-12-16 ENCOUNTER — Encounter: Payer: Self-pay | Admitting: Internal Medicine

## 2021-12-16 ENCOUNTER — Ambulatory Visit (INDEPENDENT_AMBULATORY_CARE_PROVIDER_SITE_OTHER): Payer: Medicare HMO | Admitting: Internal Medicine

## 2021-12-16 VITALS — BP 129/82 | HR 82 | Ht 67.0 in | Wt 217.2 lb

## 2021-12-16 DIAGNOSIS — Z0000 Encounter for general adult medical examination without abnormal findings: Secondary | ICD-10-CM

## 2021-12-16 DIAGNOSIS — F419 Anxiety disorder, unspecified: Secondary | ICD-10-CM | POA: Diagnosis not present

## 2021-12-16 DIAGNOSIS — F339 Major depressive disorder, recurrent, unspecified: Secondary | ICD-10-CM

## 2021-12-16 DIAGNOSIS — E118 Type 2 diabetes mellitus with unspecified complications: Secondary | ICD-10-CM | POA: Diagnosis not present

## 2021-12-16 DIAGNOSIS — Z0001 Encounter for general adult medical examination with abnormal findings: Secondary | ICD-10-CM | POA: Diagnosis not present

## 2021-12-16 DIAGNOSIS — E039 Hypothyroidism, unspecified: Secondary | ICD-10-CM | POA: Diagnosis not present

## 2021-12-16 NOTE — Patient Instructions (Signed)
It was a pleasure to see you today.  Thank you for giving [place] the opportunity to be involved in your care.  Below is a brief recap of your visit and next steps.  We will plan to see you again in 3 months.  Summary We completed your annual physical exam today Continue your current dose of levothyroxine (100 mcg) We recently increase Trulicity to help with improving your hemoglobin A1c  Next steps Follow up in 3 months for routine care

## 2021-12-16 NOTE — Progress Notes (Signed)
Complete physical exam  Patient: Thomas Rollins   DOB: 07-17-1960   61 y.o. Male  MRN: [id number]  Subjective:    Chief Complaint  Patient presents with   Annual Exam   Thomas Rollins is a 61 y.o. male who presents today for a complete physical exam. He reports consuming a general and healthy  diet. Home exercise routine includes work around his home. He generally feels fairly well. He reports sleeping fairly well. He does not have additional problems to discuss today.   Most recent fall risk assessment:    12/16/2021    8:55 AM  Fall Risk   Falls in the past year? 0  Number falls in past yr: 0  Injury with Fall? 0  Risk for fall due to : No Fall Risks  Follow up Falls evaluation completed   Most recent depression screenings:    12/16/2021    8:55 AM 11/12/2021    1:51 PM  PHQ 2/9 Scores  PHQ - 2 Score 1 0   Vision:Not within last year  and Dental: No regular dental care   Past Medical History:  Diagnosis Date   Abnormal LFTs    Anxiety    panic attacks   Chronic pain    Depression    Diabetes mellitus without complication ([place])    Encounter for hepatitis C screening test for low risk patient 04/05/2019   Hyperlipidemia    Hypertension    Hypothyroidism    Sleep apnea    TIA (transient ischemic attack) 2019   Past Surgical History:  Procedure Laterality Date   ankle sx     ARTHROSCOPIC REPAIR ACL     CARPAL TUNNEL RELEASE     COLONOSCOPY WITH PROPOFOL N/A 02/03/2021   Procedure: COLONOSCOPY WITH PROPOFOL;  Surgeon: Eloise Harman, DO;  Location: [facility];  Service: Endoscopy;  Laterality: N/A;  8:00am   HERNIA REPAIR     SHOULDER ARTHROSCOPY     Social History   Tobacco Use   Smoking status: Former    Types: Cigarettes   Smokeless tobacco: Never  Vaping Use   Vaping Use: Never used  Substance Use Topics   Alcohol use: Not Currently    Comment: none in 1 yr 10/2020   Drug use: Yes    Types: Marijuana    Comment: occas   Family History   Problem Relation Age of Onset   Stomach cancer Mother 4   Heart Problems Father    Dementia Father    Cancer Father        "tumors"   Bone cancer Brother    Lung cancer Brother    Cancer Daughter 31       Male cancer   Cancer Daughter 42       Male   Colon cancer Neg Hx    Allergies  Allergen Reactions   Codeine Other (See Comments)    Slurred Speech    Other     [place] - N/V   sweat potatoes - N/V    Penicillins Rash   Patient Care Team: Johnette Abraham, MD as PCP - General (Internal Medicine) Caren Macadam, MD as PCP - Family Medicine (Family Medicine) Eloise Harman, DO as Consulting Physician (Gastroenterology) Beryle Lathe, [facility] (Inactive) (Pharmacist)   Outpatient Medications Prior to Visit  Medication Sig   ALPHAGAN P 0.1 % SOLN Place 1 drop into both eyes 2 (two) times daily as needed (irritation).   cyclobenzaprine (FLEXERIL)  10 MG tablet TAKE 1 TABLET BY MOUTH TWICE DAILY AS NEEDED FOR MUSCLE SPASMS AND pain   Diclofenac Sodium 3 % GEL Apply 1 g topically 4 (four) times daily as needed (pain).   Dulaglutide (TRULICITY) 3 GG/2.6RS SOPN Inject 3 mg as directed once a week for 12 doses.   DULoxetine (CYMBALTA) 60 MG capsule TAKE 1 CAPSULE BY MOUTH EVERY DAY   gabapentin (NEURONTIN) 300 MG capsule Take 1 capsule (300 mg total) by mouth 2 (two) times daily.   GLOBAL EASE INJECT PEN NEEDLES 31G X 8 MM MISC Use to inject insulin once daily   Glucagon (GVOKE HYPOPEN 2-PACK) 1 MG/0.2ML SOAJ Inject 1 mg into the skin daily as needed.   levothyroxine (SYNTHROID) 100 MCG tablet Take 1 tablet (100 mcg total) by mouth daily.   lisinopril (ZESTRIL) 10 MG tablet Take 1 tablet (10 mg total) by mouth daily.   meloxicam (MOBIC) 7.5 MG tablet TAKE 1 TABLET BY MOUTH DAILY   metFORMIN (GLUCOPHAGE) 1000 MG tablet TAKE 1 TABLET BY MOUTH TWICE DAILY   metoCLOPramide (REGLAN) 5 MG tablet Take 1 tablet (5 mg total) by mouth every 8 (eight) hours as needed for nausea or  vomiting.   morphine (MS CONTIN) 30 MG 12 hr tablet Take 1 tablet by mouth 2 (two) times daily. Take with the 53m    morphine (MS CONTIN) 60 MG 12 hr tablet Take 1 tablet by mouth 2 (two) times daily. Takes with the 349m  naloxone (NARCAN) nasal spray 4 mg/0.1 mL Place 1 spray into the nose as needed (opioid overdose).   Omega-3 Fatty Acids (FISH OIL) 1000 MG CAPS Take 2 capsules (2,000 mg total) by mouth in the morning and at bedtime. (Patient taking differently: Take 1 capsule by mouth every morning.)   ONETOUCH ULTRA test strip USE UP TO FOUR TIMES DAILY AS DIRECTED (25 DAY supply)   simvastatin (ZOCOR) 40 MG tablet Take 1 tablet (40 mg total) by mouth every evening.   traZODone (DESYREL) 50 MG tablet TAKE ONE-HALF TO 1 TABLET BY MOUTH AT BEDTIME AS NEEDED FOR SLEEP   UNABLE TO FIND Blood glucose meter to check blood glucose daily DX:E11.9   UNABLE TO FIND Lancets  DX: E11.9   UNABLE TO FIND Blood glucose test strips used to check blood glucose daily DX: E11.9   blood glucose meter kit and supplies Dispense based on patient and insurance preference. Use up to four times daily as directed. (FOR ICD-10 E10.9, E11.9).   [DISCONTINUED] albuterol (VENTOLIN HFA) 108 (90 Base) MCG/ACT inhaler Inhale 2 puffs into the lungs every 6 (six) hours as needed for wheezing or shortness of breath. (Patient not taking: Reported on 07/28/2021)   [DISCONTINUED] AMBULATORY NON FORMULARY MEDICATION 1 vial by Intracavernosal route as needed. Medication Name:  Prostaglandin E1, 10 mcg vials.  Dispense 5 with 11 refills  Inject 1 vial as needed (Patient not taking: Reported on 08/13/2021)   [DISCONTINUED] Continuous Blood Gluc Receiver (DEXCOM G6 RECEIVER) DEVI Use to monitor glucose continuously   [DISCONTINUED] Continuous Blood Gluc Sensor (DEXCOM G6 SENSOR) MISC Change sensor every 10 days   [DISCONTINUED] Continuous Blood Gluc Transmit (DEXCOM G6 TRANSMITTER) MISC Use to monitor glucose continuously. Change every 90  days.   [DISCONTINUED] omeprazole (PRILOSEC) 40 MG capsule Take 1 capsule (40 mg total) by mouth daily. (Patient not taking: Reported on 10/01/2021)   No facility-administered medications prior to visit.   Review of Systems  Constitutional:  Negative for chills and fever.  HENT:  Negative for sore throat.   Respiratory:  Negative for cough and shortness of breath.   Cardiovascular:  Negative for chest pain, palpitations and leg swelling.  Gastrointestinal:  Negative for abdominal pain, blood in stool, constipation, diarrhea, nausea and vomiting.  Genitourinary:  Negative for dysuria and hematuria.  Musculoskeletal:  Negative for myalgias.       Chronic musculoskeletal pain  Skin:  Negative for itching and rash.  Neurological:  Negative for dizziness and headaches.  Psychiatric/Behavioral:  Negative for depression and suicidal ideas.   All other systems reviewed and are negative.     Objective:     BP 129/82   Pulse 82   Ht _0  (1.702 m)   Wt 217 lb 3.2 oz (98.5 kg)   SpO2 99%   BMI 34.02 kg/m  BP Readings from Last 3 Encounters:  12/16/21 129/82  11/12/21 106/69  10/01/21 103/69   Physical Exam Vitals reviewed.  Constitutional:      General: He is not in acute distress.    Appearance: Normal appearance. He is not ill-appearing.  HENT:     Head: Normocephalic and atraumatic.     Nose: Nose normal. No congestion or rhinorrhea.     Mouth/Throat:     Mouth: Mucous membranes are moist.     Pharynx: Oropharynx is clear.  Eyes:     Extraocular Movements: Extraocular movements intact.     Conjunctiva/sclera: Conjunctivae normal.     Pupils: Pupils are equal, round, and reactive to light.  Cardiovascular:     Rate and Rhythm: Normal rate and regular rhythm.     Pulses: Normal pulses.     Heart sounds: Normal heart sounds. No murmur heard. Pulmonary:     Effort: Pulmonary effort is normal.     Breath sounds: Normal breath sounds. No wheezing, rhonchi or rales.   Abdominal:     General: Abdomen is flat. Bowel sounds are normal. There is no distension.     Palpations: Abdomen is soft.     Tenderness: There is no abdominal tenderness.  Musculoskeletal:        General: No swelling or deformity.     Right lower leg: No edema.     Left lower leg: No edema.     Comments: Surgical scar along the right pec  Skin:    General: Skin is warm and dry.     Capillary Refill: Capillary refill takes less than 2 seconds.  Neurological:     General: No focal deficit present.     Mental Status: He is alert and oriented to person, place, and time.     Motor: No weakness.  Psychiatric:        Mood and Affect: Mood normal.        Behavior: Behavior normal.        Thought Content: Thought content normal.     Last CBC Lab Results  Component Value Date   WBC 5.8 04/29/2021   HGB 14.8 04/29/2021   HCT 43.1 04/29/2021   MCV 85 04/29/2021   MCH 29.2 04/29/2021   RDW 14.3 04/29/2021   PLT 187 65/68/1275   Last metabolic panel Lab Results  Component Value Date   GLUCOSE 231 (H) 12/09/2021   NA 140 12/09/2021   K 4.7 12/09/2021   CL 101 12/09/2021   CO2 20 12/09/2021   BUN 16 12/09/2021   CREATININE 0.89 12/09/2021   EGFR 96 12/09/2021   CALCIUM 9.8 12/09/2021   PROT 7.4  12/09/2021   ALBUMIN 5.0 (H) 12/09/2021   LABGLOB 2.4 12/09/2021   AGRATIO 2.1 12/09/2021   BILITOT 0.5 12/09/2021   ALKPHOS 77 12/09/2021   AST 22 12/09/2021   ALT 28 12/09/2021   ANIONGAP 8 01/31/2021   Last lipids Lab Results  Component Value Date   CHOL 135 12/09/2021   HDL 43 12/09/2021   LDLCALC 72 12/09/2021   TRIG 108 12/09/2021   CHOLHDL 3.1 12/09/2021   Last hemoglobin A1c Lab Results  Component Value Date   HGBA1C 7.5 (H) 12/09/2021   Last thyroid functions Lab Results  Component Value Date   TSH 0.915 12/09/2021   Last vitamin D Lab Results  Component Value Date   VD25OH 33.1 01/31/2020       Assessment & Plan:    Routine Health Maintenance and  Physical Exam  Immunization History  Administered Date(s) Administered   Influenza,inj,Quad PF,6+ Mos 11/12/2021   Influenza,inj,quad, With Preservative 12/16/2018   Moderna SARS-COV2 Booster Vaccination 04/30/2020   Moderna Sars-Covid-2 Vaccination 09/20/2019, 10/18/2019    Health Maintenance  Topic Date Due   OPHTHALMOLOGY EXAM  Never done   TETANUS/TDAP  Never done   Zoster Vaccines- Shingrix (1 of 2) Never done   FOOT EXAM  05/14/2022   HEMOGLOBIN A1C  06/10/2022   Diabetic kidney evaluation - Urine ACR  08/14/2022   Medicare Annual Wellness (AWV)  08/28/2022   Diabetic kidney evaluation - GFR measurement  12/10/2022   COLONOSCOPY (Pts 45-62yr Insurance coverage will need to be confirmed)  02/04/2031   INFLUENZA VACCINE  Completed   Hepatitis C Screening  Completed   HIV Screening  Completed   HPV VACCINES  Aged Out   COVID-19 Vaccine  Discontinued    Discussed health benefits of physical activity, and encouraged him to engage in regular exercise appropriate for his age and condition.  Problem List Items Addressed This Visit       Controlled diabetes mellitus type 2 [place] complications [place]    AB1Dincreased to 7.5 on labs from earlier this month.  He is currently prescribed metformin 1000 mg twice daily.  Trulicity was increased to 3 mg weekly as result of increased A1c.  No changes today.  He will start the new dose of Trulicity today. -He is due for diabetic eye exam and plans to call his ophthalmologist to schedule appointment      Hypothyroidism    Levothyroxine recently decreased 100 mcg daily.  Repeat TSH/T4 WNL.  No changes today.      Annual physical exam - Primary    He returns to care today for his annual physical exam.  Recent medical records and labs reviewed.  Outstanding preventative healthcare maintenance items reviewed as well.  He is due for diabetic eye exam and plans to call his ophthalmologist to schedule an appointment.  I recommended that he  receive outstanding Tdap and Shingrix vaccines at his pharmacy.  He requests a referral to psychiatry today due to a history of anxiety and depression.  He is otherwise asymptomatic and without acute concerns. -Follow-up in 3 months for routine care       Return in about 3 months (around 03/18/2022).     PJohnette Abraham MD

## 2021-12-16 NOTE — Assessment & Plan Note (Signed)
A1c increased to 7.5 on labs from earlier this month.  He is currently prescribed metformin 1000 mg twice daily.  Trulicity was increased to 3 mg weekly as result of increased A1c.  No changes today.  He will start the new dose of Trulicity today. -He is due for diabetic eye exam and plans to call his ophthalmologist to schedule appointment

## 2021-12-16 NOTE — Assessment & Plan Note (Signed)
Levothyroxine recently decreased 100 mcg daily.  Repeat TSH/T4 WNL.  No changes today.

## 2021-12-16 NOTE — Assessment & Plan Note (Signed)
He returns to care today for his annual physical exam.  Recent medical records and labs reviewed.  Outstanding preventative healthcare maintenance items reviewed as well.  He is due for diabetic eye exam and plans to call his ophthalmologist to schedule an appointment.  I recommended that he receive outstanding Tdap and Shingrix vaccines at his pharmacy.  He requests a referral to psychiatry today due to a history of anxiety and depression.  He is otherwise asymptomatic and without acute concerns. -Follow-up in 3 months for routine care

## 2021-12-29 ENCOUNTER — Other Ambulatory Visit: Payer: Self-pay | Admitting: Internal Medicine

## 2022-01-13 ENCOUNTER — Other Ambulatory Visit: Payer: Self-pay | Admitting: Nurse Practitioner

## 2022-01-13 DIAGNOSIS — R112 Nausea with vomiting, unspecified: Secondary | ICD-10-CM

## 2022-01-25 ENCOUNTER — Other Ambulatory Visit: Payer: Self-pay | Admitting: Nurse Practitioner

## 2022-01-29 DIAGNOSIS — M25559 Pain in unspecified hip: Secondary | ICD-10-CM | POA: Diagnosis not present

## 2022-01-29 DIAGNOSIS — Z79891 Long term (current) use of opiate analgesic: Secondary | ICD-10-CM | POA: Diagnosis not present

## 2022-01-29 DIAGNOSIS — M2550 Pain in unspecified joint: Secondary | ICD-10-CM | POA: Diagnosis not present

## 2022-01-29 DIAGNOSIS — M542 Cervicalgia: Secondary | ICD-10-CM | POA: Diagnosis not present

## 2022-01-29 DIAGNOSIS — G894 Chronic pain syndrome: Secondary | ICD-10-CM | POA: Diagnosis not present

## 2022-01-29 DIAGNOSIS — M545 Low back pain, unspecified: Secondary | ICD-10-CM | POA: Diagnosis not present

## 2022-01-29 DIAGNOSIS — G8929 Other chronic pain: Secondary | ICD-10-CM | POA: Diagnosis not present

## 2022-02-10 DIAGNOSIS — G4733 Obstructive sleep apnea (adult) (pediatric): Secondary | ICD-10-CM | POA: Diagnosis not present

## 2022-02-13 ENCOUNTER — Other Ambulatory Visit: Payer: Self-pay

## 2022-02-13 ENCOUNTER — Telehealth: Payer: Self-pay | Admitting: Internal Medicine

## 2022-02-13 MED ORDER — DICLOFENAC SODIUM 3 % EX GEL: 1.0000 g | Freq: Four times a day (QID) | CUTANEOUS | 0 refills | Status: DC | PRN

## 2022-02-13 NOTE — Telephone Encounter (Signed)
Pt wants to know if this can please refilled? His previous pain mgt gave it to him & he is unable to see new until after new year.   Prescription Request  02/13/2022  Is this a "Controlled Substance" medicine? No  LOV: 12/16/2021  What is the name of the medication or equipment? Diclofenac Sodium 3 % GEL   Have you contacted your pharmacy to request a refill? Yes   Which pharmacy would you like this sent to?  [facility] [place], [place] - [address] [place] [address] Phone: [phone] Fax: [phone]  [facility] [address], [place] - [address] Doloris Hall [address] [place] [address] Phone: [phone] Fax: [phone]    Patient notified that their request is being sent to the clinical staff for review and that they should receive a response within 2 business days.   Please advise at [facility] [phone]

## 2022-02-13 NOTE — Telephone Encounter (Signed)
Refill sent.

## 2022-02-17 ENCOUNTER — Ambulatory Visit (HOSPITAL_COMMUNITY): Payer: Medicare HMO | Admitting: Psychiatry

## 2022-02-19 ENCOUNTER — Other Ambulatory Visit: Payer: Self-pay | Admitting: Family Medicine

## 2022-02-19 DIAGNOSIS — R112 Nausea with vomiting, unspecified: Secondary | ICD-10-CM

## 2022-02-21 ENCOUNTER — Other Ambulatory Visit: Payer: Self-pay | Admitting: Internal Medicine

## 2022-02-21 DIAGNOSIS — E118 Type 2 diabetes mellitus with unspecified complications: Secondary | ICD-10-CM

## 2022-02-23 ENCOUNTER — Other Ambulatory Visit: Payer: Self-pay | Admitting: Nurse Practitioner

## 2022-02-23 DIAGNOSIS — G4700 Insomnia, unspecified: Secondary | ICD-10-CM

## 2022-02-24 ENCOUNTER — Other Ambulatory Visit: Payer: Self-pay | Admitting: Internal Medicine

## 2022-03-01 DIAGNOSIS — R519 Headache, unspecified: Secondary | ICD-10-CM | POA: Diagnosis not present

## 2022-03-01 DIAGNOSIS — W010XXA Fall on same level from slipping, tripping and stumbling without subsequent striking against object, initial encounter: Secondary | ICD-10-CM | POA: Diagnosis not present

## 2022-03-01 DIAGNOSIS — Z743 Need for continuous supervision: Secondary | ICD-10-CM | POA: Diagnosis not present

## 2022-03-01 DIAGNOSIS — Z880 Allergy status to penicillin: Secondary | ICD-10-CM | POA: Diagnosis not present

## 2022-03-01 DIAGNOSIS — S7002XA Contusion of left hip, initial encounter: Secondary | ICD-10-CM | POA: Diagnosis not present

## 2022-03-01 DIAGNOSIS — R609 Edema, unspecified: Secondary | ICD-10-CM | POA: Diagnosis not present

## 2022-03-01 DIAGNOSIS — W01198A Fall on same level from slipping, tripping and stumbling with subsequent striking against other object, initial encounter: Secondary | ICD-10-CM | POA: Diagnosis not present

## 2022-03-01 DIAGNOSIS — S0990XA Unspecified injury of head, initial encounter: Secondary | ICD-10-CM | POA: Diagnosis not present

## 2022-03-01 DIAGNOSIS — W19XXXA Unspecified fall, initial encounter: Secondary | ICD-10-CM | POA: Diagnosis not present

## 2022-03-01 DIAGNOSIS — S79912A Unspecified injury of left hip, initial encounter: Secondary | ICD-10-CM | POA: Diagnosis not present

## 2022-03-01 DIAGNOSIS — S161XXA Strain of muscle, fascia and tendon at neck level, initial encounter: Secondary | ICD-10-CM | POA: Diagnosis not present

## 2022-03-01 DIAGNOSIS — M542 Cervicalgia: Secondary | ICD-10-CM | POA: Diagnosis not present

## 2022-03-01 DIAGNOSIS — I10 Essential (primary) hypertension: Secondary | ICD-10-CM | POA: Diagnosis not present

## 2022-03-02 ENCOUNTER — Encounter: Payer: Self-pay | Admitting: Internal Medicine

## 2022-03-02 ENCOUNTER — Ambulatory Visit (INDEPENDENT_AMBULATORY_CARE_PROVIDER_SITE_OTHER): Payer: Medicare HMO | Admitting: Internal Medicine

## 2022-03-02 ENCOUNTER — Telehealth: Payer: Self-pay | Admitting: *Deleted

## 2022-03-02 VITALS — BP 130/88 | HR 101 | Ht 68.0 in | Wt 207.4 lb

## 2022-03-02 DIAGNOSIS — S161XXA Strain of muscle, fascia and tendon at neck level, initial encounter: Secondary | ICD-10-CM

## 2022-03-02 DIAGNOSIS — F410 Panic disorder [episodic paroxysmal anxiety] without agoraphobia: Secondary | ICD-10-CM

## 2022-03-02 DIAGNOSIS — F419 Anxiety disorder, unspecified: Secondary | ICD-10-CM | POA: Diagnosis not present

## 2022-03-02 MED ORDER — METHOCARBAMOL 500 MG PO TABS: 1000.0000 mg | ORAL_TABLET | Freq: Three times a day (TID) | ORAL | 0 refills | Status: DC

## 2022-03-02 MED ORDER — HYDROXYZINE PAMOATE 50 MG PO CAPS
50.0000 mg | ORAL_CAPSULE | Freq: Three times a day (TID) | ORAL | 0 refills | Status: DC | PRN
Start: 2022-03-02 — End: 2022-03-10

## 2022-03-02 NOTE — Patient Instructions (Signed)
It was a pleasure to see you today.  Thank you for giving [place] the opportunity to be involved in your care.  Below is a brief recap of your visit and next steps.  We will plan to see you again on 1/24  Summary I have refilled Robaxin Hydroxyzine 50 mg as needed has been prescribed for anxiety relief

## 2022-03-02 NOTE — Telephone Encounter (Signed)
Transition Care Management Unsuccessful Follow-up Telephone Call  Date of discharge and from where:  03/01/2022 [facility] ER   Attempts:  1st Attempt  Reason for unsuccessful TCM follow-up call:  Left voice message

## 2022-03-02 NOTE — Progress Notes (Unsigned)
Acute Office Visit  Subjective:     Patient ID: Thomas Rollins, male    DOB: Jul 18, 1960, 62 y.o.   MRN: [id number]  Chief Complaint  Patient presents with   Hospitalization Follow-up   Thomas Rollins presents for ER follow-up today.  He presented to the ED at [facility] on 1/7 after a fall on 1/6.  He reports tripping and falling backwards, hitting the back of his head.  He did not lose consciousness.  He endorsed pain at the base of his neck and scalp.  He underwent x-rays and CT of the head/neck, which were negative for fracture or acute process.  He was treated with Robaxin discharged home.  Today Thomas Rollins continues to endorse pain in his neck.  He states this pain is relieved with Robaxin.  He is also taking Tylenol and NSAIDs.  His additional concern today is anxiety that has been worse recently because of stress attributed to his roommate.  Review of Systems  Musculoskeletal:  Positive for neck pain.      Objective:    BP 130/88   Pulse (!) 101   Ht 5\' 8"  (1.727 m)   Wt 207 lb 6.4 oz (94.1 kg)   SpO2 98%   BMI 31.54 kg/m  BP Readings from Last 3 Encounters:  03/02/22 130/88  12/16/21 129/82  11/12/21 106/69   Physical Exam Vitals reviewed.  Constitutional:      General: He is not in acute distress.    Appearance: Normal appearance. He is not toxic-appearing.  HENT:     Head: Normocephalic and atraumatic.  Musculoskeletal:        General: Tenderness (Right trapezius) present.     Comments: ROM of the neck is limited secondary to pain  Skin:    General: Skin is warm and dry.  Neurological:     General: No focal deficit present.     Mental Status: He is alert and oriented to person, place, and time.     Motor: No weakness.       Assessment & Plan:   Problem List Items Addressed This Visit       Cervical strain    Presenting today for ED follow-up after recent fall, with head and neck injury.  Imaging at [facility] was negative for acute process.  ROM of the  neck is limited secondary to pain.  There is no weakness or loss of sensation in the upper extremities.  He has tenderness palpation over the right trapezius.  He endorses symptomatic improvement with use of Robaxin. -Robaxin has been refilled today -I have recommended continued use of Tylenol and NSAIDs as needed for pain relief -He has previously scheduled follow-up with me on 1/24//24      Anxiety - Primary    He endorses worsening anxiety recently due to stress related to his roommate.  He is interested in an as needed medication today. -I have prescribed hydroxyzine for as needed anxiety relief      Meds ordered this encounter  Medications   hydrOXYzine (VISTARIL) 50 MG capsule    Sig: Take 1 capsule (50 mg total) by mouth 3 (three) times daily as needed.    Dispense:  30 capsule    Refill:  0   methocarbamol (ROBAXIN) 500 MG tablet    Sig: Take 2 tablets (1,000 mg total) by mouth 3 (three) times daily for 15 doses.    Dispense:  30 tablet    Refill:  0  Return if symptoms worsen or fail to improve.  Johnette Abraham, MD

## 2022-03-05 ENCOUNTER — Telehealth: Payer: Self-pay | Admitting: Internal Medicine

## 2022-03-05 DIAGNOSIS — S161XXA Strain of muscle, fascia and tendon at neck level, initial encounter: Secondary | ICD-10-CM | POA: Insufficient documentation

## 2022-03-05 NOTE — Assessment & Plan Note (Signed)
Presenting today for ED follow-up after recent fall, with head and neck injury.  Imaging at [facility] was negative for acute process.  ROM of the neck is limited secondary to pain.  There is no weakness or loss of sensation in the upper extremities.  He has tenderness palpation over the right trapezius.  He endorses symptomatic improvement with use of Robaxin. -Robaxin has been refilled today -I have recommended continued use of Tylenol and NSAIDs as needed for pain relief -He has previously scheduled follow-up with me on 1/24//24

## 2022-03-05 NOTE — Telephone Encounter (Signed)
Made patient aware that it is okay to walk around as long as patient isn't overdoing it or lifting anything

## 2022-03-05 NOTE — Telephone Encounter (Signed)
Patient called in wants to know if it would be okay for him to run small errands or walk around around outside  as long as he is feeling okay and up to it.  Patient wants a call back.

## 2022-03-05 NOTE — Assessment & Plan Note (Signed)
He endorses worsening anxiety recently due to stress related to his roommate.  He is interested in an as needed medication today. -I have prescribed hydroxyzine for as needed anxiety relief

## 2022-03-09 ENCOUNTER — Telehealth: Payer: Self-pay | Admitting: Internal Medicine

## 2022-03-09 NOTE — Telephone Encounter (Signed)
  Patient called to get a message to his provider, gave 3 rx last week and  the naproxen (NAPROSYN) 500 MG tablet and hydrOXYzine (VISTARIL) 50 MG capsule .  Patient has been in a lot of pain and needs refill   Methocarbamol  500 mg only has 17 left. But the blue and white capsule Hydroxyzine 50 mg does not help him with his back.   Pharmacy:  [facility]  Patient asked if nurse can speak to provider and give him a call back, what should he do after taking 3 medicines and does not get no pain relief for his back pain. [phone].

## 2022-03-10 ENCOUNTER — Other Ambulatory Visit: Payer: Self-pay

## 2022-03-10 DIAGNOSIS — F419 Anxiety disorder, unspecified: Secondary | ICD-10-CM

## 2022-03-10 DIAGNOSIS — S161XXA Strain of muscle, fascia and tendon at neck level, initial encounter: Secondary | ICD-10-CM

## 2022-03-10 DIAGNOSIS — F410 Panic disorder [episodic paroxysmal anxiety] without agoraphobia: Secondary | ICD-10-CM

## 2022-03-10 MED ORDER — HYDROXYZINE PAMOATE 50 MG PO CAPS: 50.0000 mg | ORAL_CAPSULE | Freq: Three times a day (TID) | ORAL | 0 refills | Status: DC | PRN

## 2022-03-10 MED ORDER — METHOCARBAMOL 500 MG PO TABS: 1000.0000 mg | ORAL_TABLET | Freq: Three times a day (TID) | ORAL | 0 refills | Status: AC

## 2022-03-10 NOTE — Telephone Encounter (Signed)
Meds sent to pharmacy.

## 2022-03-13 DIAGNOSIS — G4733 Obstructive sleep apnea (adult) (pediatric): Secondary | ICD-10-CM | POA: Diagnosis not present

## 2022-03-16 ENCOUNTER — Encounter (HOSPITAL_COMMUNITY): Payer: Self-pay | Admitting: Psychiatry

## 2022-03-16 ENCOUNTER — Ambulatory Visit (HOSPITAL_COMMUNITY): Payer: Medicare HMO | Admitting: Psychiatry

## 2022-03-16 DIAGNOSIS — G4700 Insomnia, unspecified: Secondary | ICD-10-CM

## 2022-03-16 DIAGNOSIS — F332 Major depressive disorder, recurrent severe without psychotic features: Secondary | ICD-10-CM | POA: Diagnosis not present

## 2022-03-16 DIAGNOSIS — M25551 Pain in right hip: Secondary | ICD-10-CM | POA: Diagnosis not present

## 2022-03-16 DIAGNOSIS — Z9989 Dependence on other enabling machines and devices: Secondary | ICD-10-CM

## 2022-03-16 DIAGNOSIS — F410 Panic disorder [episodic paroxysmal anxiety] without agoraphobia: Secondary | ICD-10-CM | POA: Diagnosis not present

## 2022-03-16 DIAGNOSIS — Z87898 Personal history of other specified conditions: Secondary | ICD-10-CM | POA: Diagnosis not present

## 2022-03-16 DIAGNOSIS — F411 Generalized anxiety disorder: Secondary | ICD-10-CM

## 2022-03-16 DIAGNOSIS — F1611 Hallucinogen abuse, in remission: Secondary | ICD-10-CM | POA: Diagnosis not present

## 2022-03-16 DIAGNOSIS — Z79891 Long term (current) use of opiate analgesic: Secondary | ICD-10-CM

## 2022-03-16 DIAGNOSIS — F4310 Post-traumatic stress disorder, unspecified: Secondary | ICD-10-CM | POA: Diagnosis not present

## 2022-03-16 DIAGNOSIS — F1211 Cannabis abuse, in remission: Secondary | ICD-10-CM

## 2022-03-16 DIAGNOSIS — F419 Anxiety disorder, unspecified: Secondary | ICD-10-CM

## 2022-03-16 MED ORDER — DULOXETINE HCL 30 MG PO CPEP: 90.0000 mg | ORAL_CAPSULE | Freq: Every day | ORAL | 1 refills | Status: DC

## 2022-03-16 MED ORDER — HYDROXYZINE PAMOATE 50 MG PO CAPS: 50.0000 mg | ORAL_CAPSULE | Freq: Two times a day (BID) | ORAL | 0 refills | Status: DC | PRN

## 2022-03-16 MED ORDER — TRAZODONE HCL 50 MG PO TABS: 25.0000 mg | ORAL_TABLET | Freq: Every evening | ORAL | 0 refills | Status: DC | PRN
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
## 2022-03-18 ENCOUNTER — Encounter: Payer: Self-pay | Admitting: Internal Medicine

## 2022-03-18 ENCOUNTER — Ambulatory Visit (INDEPENDENT_AMBULATORY_CARE_PROVIDER_SITE_OTHER): Payer: Medicare HMO | Admitting: Internal Medicine

## 2022-03-18 VITALS — BP 110/69 | HR 87 | Ht 67.0 in | Wt 206.0 lb

## 2022-03-18 DIAGNOSIS — F411 Generalized anxiety disorder: Secondary | ICD-10-CM

## 2022-03-18 DIAGNOSIS — E785 Hyperlipidemia, unspecified: Secondary | ICD-10-CM

## 2022-03-18 DIAGNOSIS — E559 Vitamin D deficiency, unspecified: Secondary | ICD-10-CM

## 2022-03-18 DIAGNOSIS — E118 Type 2 diabetes mellitus with unspecified complications: Secondary | ICD-10-CM

## 2022-03-18 DIAGNOSIS — F410 Panic disorder [episodic paroxysmal anxiety] without agoraphobia: Secondary | ICD-10-CM | POA: Diagnosis not present

## 2022-03-18 DIAGNOSIS — I10 Essential (primary) hypertension: Secondary | ICD-10-CM

## 2022-03-18 DIAGNOSIS — E039 Hypothyroidism, unspecified: Secondary | ICD-10-CM | POA: Diagnosis not present

## 2022-03-18 DIAGNOSIS — G4733 Obstructive sleep apnea (adult) (pediatric): Secondary | ICD-10-CM | POA: Diagnosis not present

## 2022-03-18 DIAGNOSIS — E1169 Type 2 diabetes mellitus with other specified complication: Secondary | ICD-10-CM

## 2022-03-18 NOTE — Assessment & Plan Note (Signed)
He endorses compliance with nightly CPAP.  Today he requests referral to sleep medicine to discuss Inspire.

## 2022-03-18 NOTE — Assessment & Plan Note (Signed)
He is currently prescribed lisinopril 10 mg daily for treatment of hypertension.  His blood pressure today is 110/69.  No medication changes today.

## 2022-03-18 NOTE — Patient Instructions (Signed)
It was a pleasure to see you today.  Thank you for giving [place] the opportunity to be involved in your care.  Below is a brief recap of your visit and next steps.  We will plan to see you again in 3 months.  Summary No medication changes today. We will repeat your A1c and vitamin D levels Plan for follow up in 3 months

## 2022-03-18 NOTE — Progress Notes (Unsigned)
Established Patient Office Visit  Subjective   Patient ID: Thomas Rollins, male    DOB: 1961-02-12  Age: 62 y.o. MRN: [id number]  Chief Complaint  Patient presents with   Diabetes    Follow up   Thomas Rollins returns to care today.  He was last seen by me for an acute visit on 03/02/22 after a fall with resulting cervical strain.  He also endorsed anxiety.  I prescribed hydroxyzine for as needed anxiety relief.  In the interim he has been seen by psychiatry.  Previously he was evaluated by me on 12/16/21 for his annual exam.  No medication changes were made and 52-month follow-up was arranged.  There have otherwise been no acute interval events.  Today Thomas Rollins states that his neck pain and anxiety are improving.  He has no acute concerns to discuss.  Past Medical History:  Diagnosis Date   Abnormal LFTs    Abnormal LFTs    Anxiety    panic attacks   Chronic pain    Depression    Diabetes mellitus without complication ([place])    Encounter for hepatitis C screening test for low risk patient 04/05/2019   Encounter for screening colonoscopy    Hyperlipidemia    Hypertension    Hypothyroidism    Sleep apnea    TIA (transient ischemic attack) 2019   Past Surgical History:  Procedure Laterality Date   ankle sx     ARTHROSCOPIC REPAIR ACL     CARPAL TUNNEL RELEASE     COLONOSCOPY WITH PROPOFOL N/A 02/03/2021   Procedure: COLONOSCOPY WITH PROPOFOL;  Surgeon: Eloise Harman, DO;  Location: [facility];  Service: Endoscopy;  Laterality: N/A;  8:00am   HERNIA REPAIR     SHOULDER ARTHROSCOPY     Social History   Tobacco Use   Smoking status: Former    Types: Cigarettes   Smokeless tobacco: Never  Vaping Use   Vaping Use: Never used  Substance Use Topics   Alcohol use: Not Currently    Comment: Heavy use from ages [age] with 8 or 9 tonic based beverages daily.  Cut back significantly on adulthood with last drink Christmas 23 to avoid smoking marijuana   Drug use: Not Currently     Types: Marijuana, Mescaline, Psilocybin    Comment: Quit smoking pot in 2023 having previously used since teenage years.  One-time use mescaline and one-time use of mushrooms in his youth   Family History  Problem Relation Age of Onset   Stomach cancer Mother 37   Heart Problems Father    Dementia Father    Cancer Father        "tumors"   Bone cancer Brother    Lung cancer Brother    Cancer Daughter 65       Male cancer   Cancer Daughter 64       Male   Colon cancer Neg Hx    Allergies  Allergen Reactions   Codeine Other (See Comments)    Slurred Speech    Other     [place] - N/V    sweat potatoes - N/V  [place] - N/V   sweat potatoes - N/V   Penicillins Rash   Review of Systems  Musculoskeletal:  Positive for neck pain.  All other systems reviewed and are negative.    Objective:     BP 110/69   Pulse 87   Ht 5\' 7"  (1.702 m)   Wt 206 lb (93.4  kg)   SpO2 96%   BMI 32.26 kg/m  BP Readings from Last 3 Encounters:  03/18/22 110/69  03/02/22 130/88  12/16/21 129/82   Physical Exam Vitals reviewed.  Constitutional:      General: He is not in acute distress.    Appearance: Normal appearance. He is not ill-appearing.  HENT:     Head: Normocephalic and atraumatic.     Right Ear: External ear normal.     Left Ear: External ear normal.     Nose: Nose normal. No congestion or rhinorrhea.     Mouth/Throat:     Mouth: Mucous membranes are moist.     Pharynx: Oropharynx is clear.  Eyes:     General: No scleral icterus.    Extraocular Movements: Extraocular movements intact.     Conjunctiva/sclera: Conjunctivae normal.     Pupils: Pupils are equal, round, and reactive to light.  Cardiovascular:     Rate and Rhythm: Normal rate and regular rhythm.     Pulses: Normal pulses.     Heart sounds: Normal heart sounds. No murmur heard. Pulmonary:     Effort: Pulmonary effort is normal.     Breath sounds: Normal breath sounds. No wheezing, rhonchi or rales.   Abdominal:     General: Abdomen is flat. Bowel sounds are normal. There is no distension.     Palpations: Abdomen is soft.     Tenderness: There is no abdominal tenderness.  Musculoskeletal:        General: No swelling or deformity. Normal range of motion.     Cervical back: Normal range of motion.  Skin:    General: Skin is warm and dry.     Capillary Refill: Capillary refill takes less than 2 seconds.  Neurological:     General: No focal deficit present.     Mental Status: He is alert and oriented to person, place, and time.     Motor: No weakness.  Psychiatric:        Mood and Affect: Mood normal.        Behavior: Behavior normal.        Thought Content: Thought content normal.   Last CBC Lab Results  Component Value Date   WBC 5.8 04/29/2021   HGB 14.8 04/29/2021   HCT 43.1 04/29/2021   MCV 85 04/29/2021   MCH 29.2 04/29/2021   RDW 14.3 04/29/2021   PLT 187 50/35/4656   Last metabolic panel Lab Results  Component Value Date   GLUCOSE 231 (H) 12/09/2021   NA 140 12/09/2021   K 4.7 12/09/2021   CL 101 12/09/2021   CO2 20 12/09/2021   BUN 16 12/09/2021   CREATININE 0.89 12/09/2021   EGFR 96 12/09/2021   CALCIUM 9.8 12/09/2021   PROT 7.4 12/09/2021   ALBUMIN 5.0 (H) 12/09/2021   LABGLOB 2.4 12/09/2021   AGRATIO 2.1 12/09/2021   BILITOT 0.5 12/09/2021   ALKPHOS 77 12/09/2021   AST 22 12/09/2021   ALT 28 12/09/2021   ANIONGAP 8 01/31/2021   Last lipids Lab Results  Component Value Date   CHOL 135 12/09/2021   HDL 43 12/09/2021   LDLCALC 72 12/09/2021   TRIG 108 12/09/2021   CHOLHDL 3.1 12/09/2021   Last hemoglobin A1c Lab Results  Component Value Date   HGBA1C 8.5 (H) 03/18/2022   Last thyroid functions Lab Results  Component Value Date   TSH 0.915 12/09/2021   Last vitamin D Lab Results  Component Value Date  VD25OH 38.8 03/18/2022   The 10-year ASCVD risk score (Arnett DK, et al., 2019) is: 14.8%    Assessment & Plan:   Problem List  Items Addressed This Visit       Essential hypertension    He is currently prescribed lisinopril 10 mg daily for treatment of hypertension.  His blood pressure today is 110/69.  No medication changes today.      OSA on CPAP - Primary    He endorses compliance with nightly CPAP.  Today he requests referral to sleep medicine to discuss Inspire.      Controlled diabetes mellitus type 2 with complications (HCC)    His most recent A1c was 7.5 from October.  He is currently prescribed metformin 1000 mg twice daily and Trulicity 3 mg weekly.  He states that he has not been practicing healthy dieting habits and is concerned that his A1c will further increase on repeat.  He reports AM blood sugar readings ranging 150-170 -Repeat A1c ordered today.  If his A1c remains elevated, he would like an opportunity to make dietary changes to improve his A1c before any additional medication changes are made.       Hyperlipidemia associated with type 2 diabetes mellitus (HCC)    Currently prescribed simvastatin 40 mg daily.  His lipid panel was updated in October and is at goal.  No changes today.      Hypothyroidism    Currently prescribed levothyroxine 100 mcg daily.  TSH/T4 from October are WNL.  Asymptomatic currently.  No changes today.      Generalized anxiety disorder with panic attacks (Chronic)    Hydroxyzine was recently prescribed for as needed anxiety relief.  He states that this seems to be helping.      Vitamin D deficiency    Previous history of vitamin D deficiency.  He is not currently on vitamin D supplementation.  Repeat vitamin D level ordered today.      Return in about 3 months (around 06/17/2022).    Billie Lade, MD

## 2022-03-19 LAB — HEMOGLOBIN A1C
Est. average glucose Bld gHb Est-mCnc: 197 mg/dL
Hgb A1c MFr Bld: 8.5 % — ABNORMAL HIGH (ref 4.8–5.6)

## 2022-03-19 LAB — VITAMIN D 25 HYDROXY (VIT D DEFICIENCY, FRACTURES): Vit D, 25-Hydroxy: 38.8 ng/mL (ref 30.0–100.0)

## 2022-03-19 NOTE — Assessment & Plan Note (Signed)
Currently prescribed levothyroxine 100 mcg daily.  TSH/T4 from October are WNL.  Asymptomatic currently.  No changes today.

## 2022-03-19 NOTE — Assessment & Plan Note (Signed)
Hydroxyzine was recently prescribed for as needed anxiety relief.  He states that this seems to be helping.

## 2022-03-19 NOTE — Assessment & Plan Note (Addendum)
His most recent A1c was 7.5 from October.  He is currently prescribed metformin 1000 mg twice daily and Trulicity 3 mg weekly.  He states that he has not been practicing healthy dieting habits and is concerned that his A1c will further increase on repeat.  He reports AM blood sugar readings ranging 150-170 -Repeat A1c ordered today.  If his A1c remains elevated, he would like an opportunity to make dietary changes to improve his A1c before any additional medication changes are made.

## 2022-03-19 NOTE — Assessment & Plan Note (Signed)
Currently prescribed simvastatin 40 mg daily.  His lipid panel was updated in October and is at goal.  No changes today.

## 2022-03-19 NOTE — Assessment & Plan Note (Signed)
Previous history of vitamin D deficiency.  He is not currently on vitamin D supplementation.  Repeat vitamin D level ordered today.

## 2022-03-30 DIAGNOSIS — H5203 Hypermetropia, bilateral: Secondary | ICD-10-CM | POA: Diagnosis not present

## 2022-04-01 DIAGNOSIS — E1169 Type 2 diabetes mellitus with other specified complication: Secondary | ICD-10-CM | POA: Diagnosis not present

## 2022-04-01 DIAGNOSIS — E118 Type 2 diabetes mellitus with unspecified complications: Secondary | ICD-10-CM | POA: Diagnosis not present

## 2022-04-02 DIAGNOSIS — M2550 Pain in unspecified joint: Secondary | ICD-10-CM | POA: Diagnosis not present

## 2022-04-02 DIAGNOSIS — Z79891 Long term (current) use of opiate analgesic: Secondary | ICD-10-CM | POA: Diagnosis not present

## 2022-04-02 DIAGNOSIS — M25559 Pain in unspecified hip: Secondary | ICD-10-CM | POA: Diagnosis not present

## 2022-04-02 DIAGNOSIS — G894 Chronic pain syndrome: Secondary | ICD-10-CM | POA: Diagnosis not present

## 2022-04-02 DIAGNOSIS — G8929 Other chronic pain: Secondary | ICD-10-CM | POA: Diagnosis not present

## 2022-04-02 DIAGNOSIS — M545 Low back pain, unspecified: Secondary | ICD-10-CM | POA: Diagnosis not present

## 2022-04-02 DIAGNOSIS — M542 Cervicalgia: Secondary | ICD-10-CM | POA: Diagnosis not present

## 2022-04-13 DIAGNOSIS — G4733 Obstructive sleep apnea (adult) (pediatric): Secondary | ICD-10-CM | POA: Diagnosis not present

## 2022-04-21 ENCOUNTER — Ambulatory Visit: Payer: Medicare HMO | Admitting: Internal Medicine

## 2022-04-23 ENCOUNTER — Encounter: Payer: Self-pay | Admitting: Radiology

## 2022-04-29 ENCOUNTER — Encounter (HOSPITAL_COMMUNITY): Payer: Self-pay | Admitting: Psychiatry

## 2022-04-29 ENCOUNTER — Telehealth (HOSPITAL_COMMUNITY): Payer: Medicare HMO | Admitting: Psychiatry

## 2022-04-29 DIAGNOSIS — Z79891 Long term (current) use of opiate analgesic: Secondary | ICD-10-CM

## 2022-04-29 DIAGNOSIS — G4700 Insomnia, unspecified: Secondary | ICD-10-CM | POA: Diagnosis not present

## 2022-04-29 DIAGNOSIS — F332 Major depressive disorder, recurrent severe without psychotic features: Secondary | ICD-10-CM

## 2022-04-29 DIAGNOSIS — G4733 Obstructive sleep apnea (adult) (pediatric): Secondary | ICD-10-CM | POA: Diagnosis not present

## 2022-04-29 DIAGNOSIS — F411 Generalized anxiety disorder: Secondary | ICD-10-CM

## 2022-04-29 DIAGNOSIS — F4310 Post-traumatic stress disorder, unspecified: Secondary | ICD-10-CM

## 2022-04-29 DIAGNOSIS — F410 Panic disorder [episodic paroxysmal anxiety] without agoraphobia: Secondary | ICD-10-CM

## 2022-04-29 DIAGNOSIS — M25551 Pain in right hip: Secondary | ICD-10-CM

## 2022-04-29 MED ORDER — MIRTAZAPINE 15 MG PO TABS: 15.0000 mg | ORAL_TABLET | Freq: Every day | ORAL | 1 refills | Status: DC

## 2022-04-29 MED ORDER — DULOXETINE HCL 30 MG PO CPEP: 90.0000 mg | ORAL_CAPSULE | Freq: Every day | ORAL | 2 refills | Status: DC

## 2022-04-29 NOTE — Patient Instructions (Signed)
We discontinued the trazodone and hydroxyzine today in favor of starting Remeron 15 mg nightly.  Also change the Cymbalta from 3 times a day dosing to once daily dosing all in the morning.  I will coordinate with your PCP to hopefully get a memory test for you.
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
## 2022-04-30 DIAGNOSIS — M2550 Pain in unspecified joint: Secondary | ICD-10-CM | POA: Diagnosis not present

## 2022-04-30 DIAGNOSIS — M545 Low back pain, unspecified: Secondary | ICD-10-CM | POA: Diagnosis not present

## 2022-04-30 DIAGNOSIS — M542 Cervicalgia: Secondary | ICD-10-CM | POA: Diagnosis not present

## 2022-04-30 DIAGNOSIS — G8929 Other chronic pain: Secondary | ICD-10-CM | POA: Diagnosis not present

## 2022-04-30 DIAGNOSIS — Z79891 Long term (current) use of opiate analgesic: Secondary | ICD-10-CM | POA: Diagnosis not present

## 2022-04-30 DIAGNOSIS — M25559 Pain in unspecified hip: Secondary | ICD-10-CM | POA: Diagnosis not present

## 2022-04-30 DIAGNOSIS — G894 Chronic pain syndrome: Secondary | ICD-10-CM | POA: Diagnosis not present

## 2022-05-23 ENCOUNTER — Other Ambulatory Visit: Payer: Self-pay | Admitting: Internal Medicine

## 2022-05-25 ENCOUNTER — Other Ambulatory Visit: Payer: Self-pay | Admitting: Nurse Practitioner

## 2022-05-25 DIAGNOSIS — I10 Essential (primary) hypertension: Secondary | ICD-10-CM

## 2022-05-26 ENCOUNTER — Other Ambulatory Visit (HOSPITAL_COMMUNITY): Payer: Self-pay | Admitting: Psychiatry

## 2022-05-26 DIAGNOSIS — G4700 Insomnia, unspecified: Secondary | ICD-10-CM

## 2022-05-26 DIAGNOSIS — F332 Major depressive disorder, recurrent severe without psychotic features: Secondary | ICD-10-CM

## 2022-05-26 DIAGNOSIS — F410 Panic disorder [episodic paroxysmal anxiety] without agoraphobia: Secondary | ICD-10-CM

## 2022-05-26 DIAGNOSIS — F4310 Post-traumatic stress disorder, unspecified: Secondary | ICD-10-CM

## 2022-05-28 ENCOUNTER — Ambulatory Visit (HOSPITAL_COMMUNITY): Payer: Medicare HMO | Admitting: Clinical

## 2022-05-28 DIAGNOSIS — M2550 Pain in unspecified joint: Secondary | ICD-10-CM | POA: Diagnosis not present

## 2022-05-28 DIAGNOSIS — Z79891 Long term (current) use of opiate analgesic: Secondary | ICD-10-CM | POA: Diagnosis not present

## 2022-05-28 DIAGNOSIS — G8929 Other chronic pain: Secondary | ICD-10-CM | POA: Diagnosis not present

## 2022-05-28 DIAGNOSIS — M542 Cervicalgia: Secondary | ICD-10-CM | POA: Diagnosis not present

## 2022-05-28 DIAGNOSIS — M545 Low back pain, unspecified: Secondary | ICD-10-CM | POA: Diagnosis not present

## 2022-05-28 DIAGNOSIS — G894 Chronic pain syndrome: Secondary | ICD-10-CM | POA: Diagnosis not present

## 2022-05-28 DIAGNOSIS — M25559 Pain in unspecified hip: Secondary | ICD-10-CM | POA: Diagnosis not present

## 2022-06-22 ENCOUNTER — Other Ambulatory Visit: Payer: Self-pay | Admitting: Internal Medicine

## 2022-06-22 ENCOUNTER — Other Ambulatory Visit: Payer: Self-pay

## 2022-06-22 DIAGNOSIS — E1169 Type 2 diabetes mellitus with other specified complication: Secondary | ICD-10-CM | POA: Diagnosis not present

## 2022-06-22 DIAGNOSIS — E785 Hyperlipidemia, unspecified: Secondary | ICD-10-CM | POA: Diagnosis not present

## 2022-06-22 DIAGNOSIS — E118 Type 2 diabetes mellitus with unspecified complications: Secondary | ICD-10-CM

## 2022-06-23 LAB — CBC WITH DIFFERENTIAL/PLATELET
Basophils Absolute: 0.1 10*3/uL (ref 0.0–0.2)
Basos: 2 %
EOS (ABSOLUTE): 0.4 10*3/uL (ref 0.0–0.4)
Eos: 6 %
Hematocrit: 41.3 % (ref 37.5–51.0)
Hemoglobin: 13 g/dL (ref 13.0–17.7)
Immature Grans (Abs): 0 10*3/uL (ref 0.0–0.1)
Immature Granulocytes: 0 %
Lymphocytes Absolute: 2 10*3/uL (ref 0.7–3.1)
Lymphs: 34 %
MCH: 27.6 pg (ref 26.6–33.0)
MCHC: 31.5 g/dL (ref 31.5–35.7)
MCV: 88 fL (ref 79–97)
Monocytes Absolute: 0.5 10*3/uL (ref 0.1–0.9)
Monocytes: 9 %
Neutrophils Absolute: 2.8 10*3/uL (ref 1.4–7.0)
Neutrophils: 49 %
Platelets: 202 10*3/uL (ref 150–450)
RBC: 4.71 x10E6/uL (ref 4.14–5.80)
RDW: 14.3 % (ref 11.6–15.4)
WBC: 5.8 10*3/uL (ref 3.4–10.8)

## 2022-06-23 LAB — LIPID PANEL
Chol/HDL Ratio: 3.4 ratio (ref 0.0–5.0)
Cholesterol, Total: 148 mg/dL (ref 100–199)
HDL: 43 mg/dL (ref >39)
LDL Chol Calc (NIH): 57 mg/dL (ref 0–99)
Triglycerides: 311 mg/dL — ABNORMAL HIGH (ref 0–149)
VLDL Cholesterol Cal: 48 mg/dL — ABNORMAL HIGH (ref 5–40)

## 2022-06-23 LAB — HEMOGLOBIN A1C
Est. average glucose Bld gHb Est-mCnc: 243 mg/dL
Hgb A1c MFr Bld: 10.1 % — ABNORMAL HIGH (ref 4.8–5.6)

## 2022-06-24 ENCOUNTER — Encounter: Payer: Self-pay | Admitting: Internal Medicine

## 2022-06-24 ENCOUNTER — Ambulatory Visit (INDEPENDENT_AMBULATORY_CARE_PROVIDER_SITE_OTHER): Payer: Medicare HMO | Admitting: Internal Medicine

## 2022-06-24 VITALS — BP 121/77 | HR 83 | Ht 68.0 in | Wt 215.2 lb

## 2022-06-24 DIAGNOSIS — M1250 Traumatic arthropathy, unspecified site: Secondary | ICD-10-CM

## 2022-06-24 DIAGNOSIS — E1165 Type 2 diabetes mellitus with hyperglycemia: Secondary | ICD-10-CM | POA: Diagnosis not present

## 2022-06-24 MED ORDER — TRULICITY 4.5 MG/0.5ML ~~LOC~~ SOAJ
4.5000 mg | SUBCUTANEOUS | 0 refills | Status: DC
Start: 2022-06-24 — End: 2022-07-15

## 2022-06-24 MED ORDER — DICLOFENAC SODIUM 3 % EX GEL: 1.0000 g | Freq: Four times a day (QID) | CUTANEOUS | 0 refills | Status: DC | PRN

## 2022-06-24 MED ORDER — METFORMIN HCL 1000 MG PO TABS: 1000.0000 mg | ORAL_TABLET | Freq: Two times a day (BID) | ORAL | 1 refills | Status: DC

## 2022-06-24 NOTE — Progress Notes (Signed)
Established Patient Office Visit  Subjective   Patient ID: Thomas Rollins, male    DOB: 07-23-1960  Age: 62 y.o. MRN: [id number]  Chief Complaint  Patient presents with   Diabetes    Follow up   Mr. Tiemann returns to care today for routine follow-up.  He was last evaluated by me on 1/24.  No medication changes were made at that time.  In the interim he has been evaluated by psychiatry.  There have otherwise been no acute interval events.  Today Mr. Salois states that he feels poorly.  He states that today is "a bad day".  He has recently been titrated off of morphine and is currently on Suboxone.  He states that this has caused him to feel bad and he is currently in an argument with his roommate.  His acute concern today is requesting refills of Cymbalta and diclofenac gel.  He states that he is not able to continue seeing psychiatry because he cannot afford to pay for each visit.  He also acknowledges that the increase in his A1c is due to poor dieting habits while coming off of morphine.  Past Medical History:  Diagnosis Date   Abnormal LFTs    Abnormal LFTs    Anxiety    panic attacks   Chronic pain    Depression    Diabetes mellitus without complication (HCC)    Encounter for hepatitis C screening test for low risk patient 04/05/2019   Encounter for screening colonoscopy    Hyperlipidemia    Hypertension    Hypothyroidism    Sleep apnea    TIA (transient ischemic attack) 2019   Past Surgical History:  Procedure Laterality Date   ankle sx     ARTHROSCOPIC REPAIR ACL     CARPAL TUNNEL RELEASE     COLONOSCOPY WITH PROPOFOL N/A 02/03/2021   Procedure: COLONOSCOPY WITH PROPOFOL;  Surgeon: Lanelle Bal, DO;  Location: [facility];  Service: Endoscopy;  Laterality: N/A;  8:00am   HERNIA REPAIR     SHOULDER ARTHROSCOPY     Social History   Tobacco Use   Smoking status: Former    Types: Cigarettes   Smokeless tobacco: Never  Vaping Use   Vaping Use: Never used   Substance Use Topics   Alcohol use: Not Currently    Comment: Heavy use from ages [age] with 8 or 9 tonic based beverages daily.  Cut back significantly on adulthood with last drink Christmas 23 to avoid smoking marijuana   Drug use: Not Currently    Types: Marijuana, Mescaline, Psilocybin    Comment: Quit smoking pot in 2023 having previously used since teenage years.  One-time use mescaline and one-time use of mushrooms in his youth   Family History  Problem Relation Age of Onset   Stomach cancer Mother 62   Heart Problems Father    Dementia Father    Cancer Father        "tumors"   Bone cancer Brother    Lung cancer Brother    Cancer Daughter 46       Male cancer   Cancer Daughter 21       Male   Colon cancer Neg Hx    Allergies  Allergen Reactions   Codeine Other (See Comments)    Slurred Speech    Other     [place] - N/V    sweat potatoes - N/V  [place] - N/V   sweat potatoes - N/V  Penicillins Rash   Review of Systems  Constitutional:  Positive for malaise/fatigue.  Musculoskeletal:  Positive for back pain and joint pain.  Psychiatric/Behavioral:  Positive for depression.      Objective:     BP 121/77   Pulse 83   Ht 5\' 8"  (1.727 m)   Wt 215 lb 3.2 oz (97.6 kg)   SpO2 97%   BMI 32.72 kg/m  BP Readings from Last 3 Encounters:  06/24/22 121/77  03/18/22 110/69  03/02/22 130/88   Physical Exam Vitals reviewed.  Constitutional:      General: He is not in acute distress.    Appearance: Normal appearance. He is not ill-appearing.  HENT:     Head: Normocephalic and atraumatic.     Right Ear: External ear normal.     Left Ear: External ear normal.     Nose: Nose normal. No congestion or rhinorrhea.     Mouth/Throat:     Mouth: Mucous membranes are moist.     Pharynx: Oropharynx is clear.  Eyes:     General: No scleral icterus.    Extraocular Movements: Extraocular movements intact.     Conjunctiva/sclera: Conjunctivae normal.     Pupils:  Pupils are equal, round, and reactive to light.  Cardiovascular:     Rate and Rhythm: Normal rate and regular rhythm.     Pulses: Normal pulses.     Heart sounds: Normal heart sounds. No murmur heard. Pulmonary:     Effort: Pulmonary effort is normal.     Breath sounds: Normal breath sounds. No wheezing, rhonchi or rales.  Abdominal:     General: Abdomen is flat. Bowel sounds are normal. There is no distension.     Palpations: Abdomen is soft.     Tenderness: There is no abdominal tenderness.  Musculoskeletal:        General: No swelling or deformity. Normal range of motion.     Cervical back: Normal range of motion.  Skin:    General: Skin is warm and dry.     Capillary Refill: Capillary refill takes less than 2 seconds.  Neurological:     General: No focal deficit present.     Mental Status: He is alert and oriented to person, place, and time.     Motor: No weakness.  Psychiatric:     Comments: Tearful during encounter   Diabetic foot exam was performed.  No deformities or other abnormal visual findings.  Posterior tibialis and dorsalis pulse intact bilaterally.  Intact to touch and monofilament testing bilaterally.    Last CBC Lab Results  Component Value Date   WBC 5.8 06/22/2022   HGB 13.0 06/22/2022   HCT 41.3 06/22/2022   MCV 88 06/22/2022   MCH 27.6 06/22/2022   RDW 14.3 06/22/2022   PLT 202 06/22/2022   Last metabolic panel Lab Results  Component Value Date   GLUCOSE 231 (H) 12/09/2021   NA 140 12/09/2021   K 4.7 12/09/2021   CL 101 12/09/2021   CO2 20 12/09/2021   BUN 16 12/09/2021   CREATININE 0.89 12/09/2021   EGFR 96 12/09/2021   CALCIUM 9.8 12/09/2021   PROT 7.4 12/09/2021   ALBUMIN 5.0 (H) 12/09/2021   LABGLOB 2.4 12/09/2021   AGRATIO 2.1 12/09/2021   BILITOT 0.5 12/09/2021   ALKPHOS 77 12/09/2021   AST 22 12/09/2021   ALT 28 12/09/2021   ANIONGAP 8 01/31/2021   Last lipids Lab Results  Component Value Date   CHOL 148 06/22/2022  HDL  43 06/22/2022   LDLCALC 57 06/22/2022   TRIG 311 (H) 06/22/2022   CHOLHDL 3.4 06/22/2022   Last hemoglobin A1c Lab Results  Component Value Date   HGBA1C 10.1 (H) 06/22/2022   Last thyroid functions Lab Results  Component Value Date   TSH 0.915 12/09/2021   Last vitamin D Lab Results  Component Value Date   VD25OH 38.8 03/18/2022   The 10-year ASCVD risk score (Arnett DK, et al., 2019) is: 18.5%    Assessment & Plan:   Problem List Items Addressed This Visit       Uncontrolled type 2 diabetes mellitus with hyperglycemia (HCC) - Primary    A1c has increased to 10.1.  8.5 previously.  He is currently prescribed Trulicity 3 mg weekly and metformin 1000 mg twice daily.  He acknowledges that he has been eating poorly while titrating off of morphine. -Increase Trulicity to 4.5 mg weekly -Continue metformin 1000 mg twice daily -He is aware that significant dietary changes are needed in order to improve his blood sugar.  He would prefer to focus on this rather than for additional medications to be added at this time -Follow-up in 3 months for reassessment -Diabetic foot exam completed today      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    Chronic condition.  This has previously been managed with morphine.  He is followed by pain management and has recently titrated off of morphine.  He is currently prescribed Suboxone.  States that he has been feeling poorly recently while titrating off of morphine. -Reports that he will follow-up with pain management tomorrow -Continue Cymbalta and diclofenac gel for pain relief       Return in about 3 months (around 09/24/2022).    Billie Lade, MD

## 2022-06-24 NOTE — Assessment & Plan Note (Signed)
A1c has increased to 10.1.  8.5 previously.  He is currently prescribed Trulicity 3 mg weekly and metformin 1000 mg twice daily.  He acknowledges that he has been eating poorly while titrating off of morphine. -Increase Trulicity to 4.5 mg weekly -Continue metformin 1000 mg twice daily -He is aware that significant dietary changes are needed in order to improve his blood sugar.  He would prefer to focus on this rather than for additional medications to be added at this time -Follow-up in 3 months for reassessment -Diabetic foot exam completed today

## 2022-06-24 NOTE — Assessment & Plan Note (Signed)
Chronic condition.  This has previously been managed with morphine.  He is followed by pain management and has recently titrated off of morphine.  He is currently prescribed Suboxone.  States that he has been feeling poorly recently while titrating off of morphine. -Reports that he will follow-up with pain management tomorrow -Continue Cymbalta and diclofenac gel for pain relief

## 2022-06-24 NOTE — Patient Instructions (Signed)
It was a pleasure to see you today.  Thank you for giving [place] the opportunity to be involved in your care.  Below is a brief recap of your visit and next steps.  We will plan to see you again in 3 months.  Summary Increase Trulicity to 4.5 mg weekly Please monitor your dieting habits in order to improve your blood sugar I can refill Cymbalta when needed Follow up in 3 months

## 2022-06-25 DIAGNOSIS — M25559 Pain in unspecified hip: Secondary | ICD-10-CM | POA: Diagnosis not present

## 2022-06-25 DIAGNOSIS — Z79891 Long term (current) use of opiate analgesic: Secondary | ICD-10-CM | POA: Diagnosis not present

## 2022-06-25 DIAGNOSIS — G894 Chronic pain syndrome: Secondary | ICD-10-CM | POA: Diagnosis not present

## 2022-06-25 DIAGNOSIS — G8929 Other chronic pain: Secondary | ICD-10-CM | POA: Diagnosis not present

## 2022-06-25 DIAGNOSIS — M542 Cervicalgia: Secondary | ICD-10-CM | POA: Diagnosis not present

## 2022-06-25 DIAGNOSIS — M2550 Pain in unspecified joint: Secondary | ICD-10-CM | POA: Diagnosis not present

## 2022-06-25 DIAGNOSIS — M545 Low back pain, unspecified: Secondary | ICD-10-CM | POA: Diagnosis not present

## 2022-06-29 ENCOUNTER — Other Ambulatory Visit (HOSPITAL_COMMUNITY): Payer: Self-pay | Admitting: Psychiatry

## 2022-06-29 DIAGNOSIS — F4310 Post-traumatic stress disorder, unspecified: Secondary | ICD-10-CM

## 2022-06-29 DIAGNOSIS — F410 Panic disorder [episodic paroxysmal anxiety] without agoraphobia: Secondary | ICD-10-CM

## 2022-06-29 DIAGNOSIS — F332 Major depressive disorder, recurrent severe without psychotic features: Secondary | ICD-10-CM

## 2022-06-29 DIAGNOSIS — G4700 Insomnia, unspecified: Secondary | ICD-10-CM

## 2022-07-02 ENCOUNTER — Telehealth: Payer: Self-pay | Admitting: Internal Medicine

## 2022-07-02 NOTE — Telephone Encounter (Signed)
Pt called and stated a prior Auth was sent for a sleep medication. Not sure which medication it is. He could not tell me. Please follow up.

## 2022-07-02 NOTE — Telephone Encounter (Signed)
Pt called again and stated he can not sleep without medication, if he needs an appt I can schedule him one.    mirtazapine (REMERON) 15 MG tablet [[id number]]

## 2022-07-02 NOTE — Telephone Encounter (Signed)
Got pt an appt scheduled for tomorrow with Allena Katz.

## 2022-07-03 ENCOUNTER — Ambulatory Visit: Payer: Medicare HMO | Admitting: Internal Medicine

## 2022-07-03 ENCOUNTER — Telehealth: Payer: Self-pay | Admitting: Internal Medicine

## 2022-07-03 ENCOUNTER — Telehealth (INDEPENDENT_AMBULATORY_CARE_PROVIDER_SITE_OTHER): Payer: Medicare HMO | Admitting: Psychiatry

## 2022-07-03 ENCOUNTER — Encounter (HOSPITAL_COMMUNITY): Payer: Self-pay | Admitting: Psychiatry

## 2022-07-03 DIAGNOSIS — F4310 Post-traumatic stress disorder, unspecified: Secondary | ICD-10-CM

## 2022-07-03 DIAGNOSIS — F411 Generalized anxiety disorder: Secondary | ICD-10-CM

## 2022-07-03 DIAGNOSIS — G4700 Insomnia, unspecified: Secondary | ICD-10-CM | POA: Diagnosis not present

## 2022-07-03 DIAGNOSIS — F410 Panic disorder [episodic paroxysmal anxiety] without agoraphobia: Secondary | ICD-10-CM | POA: Diagnosis not present

## 2022-07-03 DIAGNOSIS — F331 Major depressive disorder, recurrent, moderate: Secondary | ICD-10-CM

## 2022-07-03 DIAGNOSIS — M25551 Pain in right hip: Secondary | ICD-10-CM | POA: Diagnosis not present

## 2022-07-03 MED ORDER — DULOXETINE HCL 30 MG PO CPEP: 90.0000 mg | ORAL_CAPSULE | Freq: Every day | ORAL | 0 refills | Status: DC

## 2022-07-03 MED ORDER — MIRTAZAPINE 15 MG PO TABS: 15.0000 mg | ORAL_TABLET | Freq: Every day | ORAL | 0 refills | Status: DC

## 2022-07-03 NOTE — Telephone Encounter (Signed)
[facility]oke with [facility] and notified her of patient concern with PA.  [facility] will address PA and be in contact with the patient.

## 2022-07-03 NOTE — Patient Instructions (Signed)
We didn't make any medication changes today. We will see if your PCP can continue to fill your medications to ease the financial burden on seeing multiple providers. If you need adjustment in the future, please reach out to get scheduled.
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
## 2022-07-03 NOTE — Telephone Encounter (Signed)
Pt states sleep meds wasn't sent over to pharmacy,, and he is about to run out this weekend. Please follow up. Allena Katz could not see pt this morning, so its urgent. Pt can not sleep without med. Pt was last seen 06/24/2022.

## 2022-07-08 ENCOUNTER — Telehealth: Payer: Self-pay | Admitting: Internal Medicine

## 2022-07-08 DIAGNOSIS — E118 Type 2 diabetes mellitus with unspecified complications: Secondary | ICD-10-CM | POA: Diagnosis not present

## 2022-07-08 DIAGNOSIS — E1169 Type 2 diabetes mellitus with other specified complication: Secondary | ICD-10-CM | POA: Diagnosis not present

## 2022-07-08 NOTE — Telephone Encounter (Signed)
No release is signed off to send records. Patients previous chart notes state nothing about neuro. Will not send chart notes.

## 2022-07-08 NOTE — Telephone Encounter (Signed)
[facility] called in regards to pt's last visit, I gave the info to them, she also requested a fax of the last appt as well. Fax# [phone]

## 2022-07-10 NOTE — Telephone Encounter (Signed)
Spoke with Victorino Dike at [facility] to understand request for medical records.  Victorino Dike stated the provider placed an order. The patient consented to testing. The insurance approved the testing. The patient completed it and the results were faxed to our office. She states she needs his last OV notes to close the order.  Explained [facility] policy and she repeated the above statement. Explained patient consented to testing and not release of records. Consent form in Media tab.

## 2022-07-15 ENCOUNTER — Other Ambulatory Visit: Payer: Self-pay

## 2022-07-15 ENCOUNTER — Other Ambulatory Visit: Payer: Self-pay | Admitting: Internal Medicine

## 2022-07-15 ENCOUNTER — Telehealth: Payer: Self-pay | Admitting: Internal Medicine

## 2022-07-15 DIAGNOSIS — E1165 Type 2 diabetes mellitus with hyperglycemia: Secondary | ICD-10-CM

## 2022-07-15 MED ORDER — TRULICITY 4.5 MG/0.5ML ~~LOC~~ SOAJ
4.5000 mg | SUBCUTANEOUS | 0 refills | Status: DC
Start: 2022-07-15 — End: 2022-07-22

## 2022-07-15 NOTE — Telephone Encounter (Signed)
Prescription Request  07/15/2022  LOV: 06/24/2022  What is the name of the medication or equipment? Dulaglutide (TRULICITY) 4.5 MG/0.5ML SOPN [[id number]]    Have you contacted your pharmacy to request a refill? Yes   Which pharmacy would you like this sent to?  [facility] [place], [place] - [address] [place] [address] Phone: [phone] Fax: [phone]    Patient notified that their request is being sent to the clinical staff for review and that they should receive a response within 2 business days.   Please advise at [facility] [phone]   Pt will be out of town Friday needs med sent in will be out of med before back in town.

## 2022-07-15 NOTE — Telephone Encounter (Signed)
Refill sent.

## 2022-07-21 DIAGNOSIS — Z79891 Long term (current) use of opiate analgesic: Secondary | ICD-10-CM | POA: Diagnosis not present

## 2022-07-21 DIAGNOSIS — M2550 Pain in unspecified joint: Secondary | ICD-10-CM | POA: Diagnosis not present

## 2022-07-21 DIAGNOSIS — M25559 Pain in unspecified hip: Secondary | ICD-10-CM | POA: Diagnosis not present

## 2022-07-21 DIAGNOSIS — M542 Cervicalgia: Secondary | ICD-10-CM | POA: Diagnosis not present

## 2022-07-21 DIAGNOSIS — G894 Chronic pain syndrome: Secondary | ICD-10-CM | POA: Diagnosis not present

## 2022-07-21 DIAGNOSIS — M545 Low back pain, unspecified: Secondary | ICD-10-CM | POA: Diagnosis not present

## 2022-07-21 DIAGNOSIS — G8929 Other chronic pain: Secondary | ICD-10-CM | POA: Diagnosis not present

## 2022-07-21 DIAGNOSIS — Z79899 Other long term (current) drug therapy: Secondary | ICD-10-CM | POA: Diagnosis not present

## 2022-07-21 NOTE — Telephone Encounter (Signed)
Patient called left a voicemail the pharmacy is currently out of stock needs an alternative medicine, patient is leaving out of town Friday.05.31.2024.

## 2022-07-22 ENCOUNTER — Other Ambulatory Visit (HOSPITAL_COMMUNITY): Payer: Self-pay

## 2022-07-22 ENCOUNTER — Other Ambulatory Visit: Payer: Self-pay

## 2022-07-22 DIAGNOSIS — E1140 Type 2 diabetes mellitus with diabetic neuropathy, unspecified: Secondary | ICD-10-CM | POA: Diagnosis not present

## 2022-07-22 DIAGNOSIS — E1165 Type 2 diabetes mellitus with hyperglycemia: Secondary | ICD-10-CM

## 2022-07-22 MED ORDER — TRULICITY 4.5 MG/0.5ML ~~LOC~~ SOAJ
4.5000 mg | SUBCUTANEOUS | 0 refills | Status: DC
Start: 2022-07-22 — End: 2022-07-22
  Filled 2022-07-22: qty 2, 28d supply, fill #0

## 2022-07-22 NOTE — Telephone Encounter (Signed)
Pt called back in regard to previous tele message, wants a call back in regards. Has not heard anything in regard

## 2022-07-24 ENCOUNTER — Other Ambulatory Visit: Payer: Self-pay

## 2022-07-24 MED ORDER — OZEMPIC (0.25 OR 0.5 MG/DOSE) 2 MG/3ML ~~LOC~~ SOPN: 0.2500 mg | PEN_INJECTOR | SUBCUTANEOUS | 0 refills | Status: DC

## 2022-07-24 NOTE — Telephone Encounter (Signed)
Order placed

## 2022-08-04 ENCOUNTER — Telehealth: Payer: Self-pay | Admitting: Internal Medicine

## 2022-08-04 NOTE — Telephone Encounter (Signed)
Alinda Money called from [facility].  [phone]  Faxed over lab requisition form to attention Dr Durwin Nora.  Have you received his fax please return call.

## 2022-08-05 ENCOUNTER — Ambulatory Visit (INDEPENDENT_AMBULATORY_CARE_PROVIDER_SITE_OTHER): Payer: Medicare HMO

## 2022-08-05 ENCOUNTER — Other Ambulatory Visit: Payer: Self-pay | Admitting: Internal Medicine

## 2022-08-05 VITALS — Ht 68.0 in | Wt 210.0 lb

## 2022-08-05 DIAGNOSIS — M1250 Traumatic arthropathy, unspecified site: Secondary | ICD-10-CM

## 2022-08-05 DIAGNOSIS — Z0000 Encounter for general adult medical examination without abnormal findings: Secondary | ICD-10-CM

## 2022-08-05 MED ORDER — DICLOFENAC SODIUM 3 % EX GEL
1.0000 g | Freq: Four times a day (QID) | CUTANEOUS | 0 refills | Status: DC | PRN
Start: 2022-08-05 — End: 2022-12-03

## 2022-08-05 NOTE — Progress Notes (Signed)
I connected with  Thomas Rollins on 08/05/22 by a audio enabled telemedicine application and verified that I am speaking with the correct person using two identifiers.  Patient Location: Home  Provider Location: Office/Clinic  I discussed the limitations of evaluation and management by telemedicine. The patient expressed understanding and agreed to proceed.  Due to this visit being a telehealth visit, certain criteria was not obtained, such a blood pressure, CBG if patient is a diabetic, and timed up and go.   Subjective:   Thomas Rollins is a 62 y.o. male who presents for Medicare Annual/Subsequent preventive examination.  Review of Systems      Cardiac Risk Factors include: diabetes mellitus;dyslipidemia;hypertension;male gender;obesity (BMI >30kg/m2)     Objective:    Today's Vitals   08/05/22 0827 08/05/22 0828  Weight: 210 lb (95.3 kg)   Height: 5\' 8"  (1.727 m)   PainSc:  8    Body mass index is 31.93 kg/m.     07/28/2021   11:44 AM 02/03/2021    7:03 AM 01/31/2021    9:52 AM 07/18/2020    9:57 AM 09/17/2019   11:32 AM 09/07/2019    1:03 PM 09/30/2017    8:44 AM  Advanced Directives  Does Patient Have a Medical Advance Directive? No No No No No No No  Would patient like information on creating a medical advance directive? Yes (ED - Information included in AVS) No - Patient declined No - Patient declined No - Patient declined       Current Medications (verified) Outpatient Encounter Medications as of 08/05/2022  Medication Sig   blood glucose meter kit and supplies Dispense based on patient and insurance preference. Use up to four times daily as directed. (FOR ICD-10 E10.9, E11.9).   Buprenorphine HCl-Naloxone HCl 8-2 MG FILM Place 8 mg of opioid under the tongue every 12 (twelve) hours.   cyclobenzaprine (FLEXERIL) 10 MG tablet Take 10 mg by mouth 2 (two) times daily as needed.   Diclofenac Sodium 3 % GEL Apply 1 g topically 4 (four) times daily as needed (pain).    DULoxetine (CYMBALTA) 30 MG capsule Take 3 capsules (90 mg total) by mouth daily. Take all three capsules in the morning.   gabapentin (NEURONTIN) 300 MG capsule Take 1 capsule (300 mg total) by mouth 2 (two) times daily.   GLOBAL EASE INJECT PEN NEEDLES 31G X 8 MM MISC Use to inject insulin once daily   Glucagon (GVOKE HYPOPEN 2-PACK) 1 MG/0.2ML SOAJ Inject 1 mg into the skin daily as needed.   levothyroxine (SYNTHROID) 100 MCG tablet Take 1 tablet (100 mcg total) by mouth daily.   lisinopril (ZESTRIL) 10 MG tablet TAKE 1 TABLET BY MOUTH DAILY   metFORMIN (GLUCOPHAGE) 1000 MG tablet Take 1 tablet (1,000 mg total) by mouth 2 (two) times daily.   mirtazapine (REMERON) 15 MG tablet Take 1 tablet (15 mg total) by mouth at bedtime.   naloxone (NARCAN) nasal spray 4 mg/0.1 mL Place 1 spray into the nose as needed (opioid overdose).   ONETOUCH ULTRA test strip USE UP TO FOUR TIMES DAILY AS DIRECTED (25 DAY supply)   Semaglutide,0.25 or 0.5MG /DOS, (OZEMPIC, 0.25 OR 0.5 MG/DOSE,) 2 MG/3ML SOPN Inject 0.25 mg into the skin once a week.   simvastatin (ZOCOR) 40 MG tablet TAKE 1 TABLET BY MOUTH EVERY EVENING   meloxicam (MOBIC) 7.5 MG tablet TAKE 1 TABLET BY MOUTH DAILY   metoCLOPramide (REGLAN) 5 MG tablet TAKE 1 TABLET BY MOUTH  EVERY 8 HOURS AS NEEDED FOR NAUSEA OR FOR VOMITING   naproxen (NAPROSYN) 500 MG tablet Take 500 mg by mouth 2 (two) times daily with a meal.   No facility-administered encounter medications on file as of 08/05/2022.    Allergies (verified) Codeine, Other, and Penicillins   History: Past Medical History:  Diagnosis Date   Abnormal LFTs    Abnormal LFTs    Anxiety    panic attacks   Chronic pain    Depression    Diabetes mellitus without complication (HCC)    Encounter for hepatitis C screening test for low risk patient 04/05/2019   Encounter for screening colonoscopy    Hyperlipidemia    Hypertension    Hypothyroidism    Sleep apnea    TIA (transient ischemic  attack) 2019   Past Surgical History:  Procedure Laterality Date   ankle sx     ARTHROSCOPIC REPAIR ACL     CARPAL TUNNEL RELEASE     COLONOSCOPY WITH PROPOFOL N/A 02/03/2021   Procedure: COLONOSCOPY WITH PROPOFOL;  Surgeon: Lanelle Bal, DO;  Location: [facility];  Service: Endoscopy;  Laterality: N/A;  8:00am   HERNIA REPAIR     SHOULDER ARTHROSCOPY     Family History  Problem Relation Age of Onset   Stomach cancer Mother 51   Heart Problems Father    Dementia Father    Cancer Father        "tumors"   Bone cancer Brother    Lung cancer Brother    Cancer Daughter 55       Male cancer   Cancer Daughter 20       Male   Colon cancer Neg Hx    Social History   Socioeconomic History   Marital status: Widowed    Spouse name: Not on file   Number of children: 1   Years of education: Not on file   Highest education level: 12th grade  Occupational History   Not on file  Tobacco Use   Smoking status: Former    Types: Cigarettes   Smokeless tobacco: Never  Vaping Use   Vaping Use: Never used  Substance and Sexual Activity   Alcohol use: Not Currently    Comment: Heavy use from ages [age] with 8 or 9 tonic based beverages daily.  Cut back significantly on adulthood with last drink Christmas 23 to avoid smoking marijuana   Drug use: Not Currently    Types: Marijuana, Mescaline, Psilocybin    Comment: Quit smoking pot in 2023 having previously used since teenage years.  One-time use mescaline and one-time use of mushrooms in his youth   Sexual activity: Never  Other Topics Concern   Not on file  Social History Narrative   Lives with good friend of his, known for 30 years    Only daughter passed away from cancer in Nov 26, 2016       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   Takes time release morphine b/c fell 15-20 feet from a building while working.    Now disabled        Exercises/walks: 7 days a week   Has pets at home: 5 dogs in the home.      Diet: Avoids  white foods, fruits, veggies, does eat some fast food/eat out    Caffeine: none only hot chocolate or soda when stomach is upset   Water: 8 cups daily if not more      Wears seat belt  Smoke detectors and carbon monoxide detectors at home    Does not use phone while driving                      Social Determinants of Health   Financial Resource Strain: Low Risk  (08/05/2022)   Overall Financial Resource Strain (CARDIA)    Difficulty of Paying Living Expenses: Not hard at all  Food Insecurity: No Food Insecurity (08/05/2022)   Hunger Vital Sign    Worried About Running Out of Food in the Last Year: Never true    Ran Out of Food in the Last Year: Never true  Transportation Needs: No Transportation Needs (08/05/2022)   PRAPARE - Administrator, Civil Service (Medical): No    Lack of Transportation (Non-Medical): No  Physical Activity: Sufficiently Active (08/05/2022)   Exercise Vital Sign    Days of Exercise per Week: 7 days    Minutes of Exercise per Session: 30 min  Stress: No Stress Concern Present (08/05/2022)   [employer] of Occupational Health - Occupational Stress Questionnaire    Feeling of Stress : Not at all  Social Connections: Socially Isolated (08/05/2022)   Social Connection and Isolation Panel [NHANES]    Frequency of Communication with Friends and Family: Twice a week    Frequency of Social Gatherings with Friends and Family: Twice a week    Attends Religious Services: Never    Database administrator or Organizations: No    Attends Engineer, structural: Never    Marital Status: Never married    Tobacco Counseling Counseling given: Yes   Clinical Intake:  Pre-visit preparation completed: Yes  Pain : 0-10 Pain Score: 8  Pain Type: Chronic pain Pain Location: Hip (and knee) Pain Orientation: Right, Left Pain Descriptors / Indicators: Constant, Throbbing, Stabbing Pain Onset: More than a month ago     BMI - recorded:  31.93 Nutritional Status: BMI > 30  Obese Nutritional Risks: None Diabetes: Yes (telehealth visit.) CBG done?: No Did pt. bring in CBG monitor from home?: No  How often do you need to have someone help you when you read instructions, pamphlets, or other written materials from your doctor or pharmacy?: 1 - Never  Diabetic?yes  Nutrition Risk Assessment:  Has the patient had any N/V/D within the last 2 months?  No  Does the patient have any non-healing wounds?  No  Has the patient had any unintentional weight loss or weight gain?  No   Diabetes:  Is the patient diabetic?  Yes  If diabetic, was a CBG obtained today?  No  Did the patient bring in their glucometer from home?  No  How often do you monitor your CBG's? daily.   Financial Strains and Diabetes Management:  Are you having any financial strains with the device, your supplies or your medication? No .  Does the patient want to be seen by Chronic Care Management for management of their diabetes?  No  Would the patient like to be referred to a Nutritionist or for Diabetic Management?  No   Diabetic Exams:  Diabetic Eye Exam: Completed please request eye exam @ [facility] [place] [facility] Diabetic Foot Exam: Completed 06/24/2022   Interpreter Needed?: No  Information entered by :: Abby Logann Whitebread, CMA   Activities of Daily Living    08/05/2022    8:40 AM  In your present state of health, do you have any difficulty performing the following activities:  Hearing? 0  Vision? 0  Difficulty concentrating or making decisions? 0  Walking or climbing stairs? 1  Comment chronic hip and knee pain  Dressing or bathing? 0  Doing errands, shopping? 0  Using the Toilet? N  In the past six months, have you accidently leaked urine? N  Do you have problems with loss of bowel control? N  Managing your Medications? N  Managing your Finances? N  Housekeeping or managing your Housekeeping? N    Patient Care Team: Billie Lade, MD as PCP - General (Internal Medicine) Aliene Beams, MD as PCP - Family Medicine (Family Medicine) Lanelle Bal, DO as Consulting Physician (Gastroenterology) Gavin Pound, [facility] (Inactive) (Pharmacist)  Indicate any recent Medical Services you may have received from other than [facility] providers in the past year (date may be approximate).     Assessment:   This is a routine wellness examination for Virgel.  Hearing/Vision screen Hearing Screening - Comments:: Patient denies any hearing difficulties.   Vision Screening - Comments:: Patient denies any difficulties with vision   Dietary issues and exercise activities discussed: Current Exercise Habits: Home exercise routine, Type of exercise: walking, Time (Minutes): 60, Frequency (Times/Week): 7, Weekly Exercise (Minutes/Week): 420, Intensity: Mild, Exercise limited by: orthopedic condition(s)   Goals Addressed             This Visit's Progress    Patient Stated       Patient states his goal is to lose weight due to severe hip and knee pain. His other goal is to come off of Metformin.        Depression Screen    08/05/2022    8:35 AM 06/24/2022    1:20 PM 03/18/2022    1:35 PM 03/16/2022   11:24 AM 03/02/2022    4:20 PM 12/16/2021    8:55 AM 11/12/2021    1:51 PM  PHQ 2/9 Scores  PHQ - 2 Score 0 3 2  1 1  0  PHQ- 9 Score 0 10 4         Information is confidential and restricted. Go to Review Flowsheets to unlock data.    Fall Risk    08/05/2022    8:34 AM 06/24/2022    1:20 PM 03/18/2022    1:35 PM 03/02/2022    4:20 PM 12/16/2021    8:55 AM  Fall Risk   Falls in the past year? 0 1 0 0 0  Number falls in past yr: 0 1 1 0 0  Injury with Fall? 0 1 1 0 0  Risk for fall due to : No Fall Risks Impaired balance/gait;Impaired mobility Impaired balance/gait;Impaired mobility No Fall Risks No Fall Risks  Follow up Falls prevention discussed Falls evaluation completed Falls evaluation completed Falls evaluation  completed Falls evaluation completed    FALL RISK PREVENTION PERTAINING TO THE HOME:  Any stairs in or around the home? No  If so, are there any without handrails? No  Home free of loose throw rugs in walkways, pet beds, electrical cords, etc? Yes  Adequate lighting in your home to reduce risk of falls? Yes   ASSISTIVE DEVICES UTILIZED TO PREVENT FALLS:  Life alert? No  Use of a cane, walker or w/c? No  Grab bars in the bathroom? No  Shower chair or bench in shower? No  Elevated toilet seat or a handicapped toilet? No   TIMED UP AND GO:  Was the test performed? No .    Cognitive Function:  08/05/2022    8:41 AM 07/28/2021   11:47 AM 07/18/2020   10:04 AM  6CIT Screen  What Year? 0 points 0 points 0 points  What month? 0 points 0 points 0 points  What time? 0 points 0 points 0 points  Count back from 20 0 points 0 points 0 points  Months in reverse 0 points 4 points   Repeat phrase 0 points 4 points   Total Score 0 points 8 points     Immunizations Immunization History  Administered Date(s) Administered   Influenza,inj,Quad PF,6+ Mos 11/12/2021   Influenza,inj,quad, With Preservative 12/16/2018   Moderna SARS-COV2 Booster Vaccination 04/30/2020   Moderna Sars-Covid-2 Vaccination 09/20/2019, 10/18/2019    TDAP status: Due, Education has been provided regarding the importance of this vaccine. Advised may receive this vaccine at local pharmacy or Health Dept. Aware to provide a copy of the vaccination record if obtained from local pharmacy or Health Dept. Verbalized acceptance and understanding.  Flu Vaccine status: Up to date  Pneumonia: Does not apply for this patient Not age appropriate  Covid-19 vaccine status: Completed vaccines  Qualifies for Shingles Vaccine? Yes   Zostavax completed Yes   Shingrix Completed?: No.    Education has been provided regarding the importance of this vaccine. Patient has been advised to call insurance company to determine out  of pocket expense if they have not yet received this vaccine. Advised may also receive vaccine at local pharmacy or Health Dept. Verbalized acceptance and understanding.  Screening Tests Health Maintenance  Topic Date Due   OPHTHALMOLOGY EXAM  Never done   DTaP/Tdap/Td (1 - Tdap) Never done   Zoster Vaccines- Shingrix (1 of 2) Never done   Diabetic kidney evaluation - Urine ACR  08/14/2022   INFLUENZA VACCINE  09/24/2022   Diabetic kidney evaluation - eGFR measurement  12/10/2022   HEMOGLOBIN A1C  12/22/2022   FOOT EXAM  06/24/2023   Medicare Annual Wellness (AWV)  08/05/2023   Colonoscopy  02/04/2031   Hepatitis C Screening  Completed   HIV Screening  Completed   HPV VACCINES  Aged Out   COVID-19 Vaccine  Discontinued    Health Maintenance  Health Maintenance Due  Topic Date Due   OPHTHALMOLOGY EXAM  Never done   DTaP/Tdap/Td (1 - Tdap) Never done   Zoster Vaccines- Shingrix (1 of 2) Never done   Diabetic kidney evaluation - Urine ACR  08/14/2022    Colorectal cancer screening: Type of screening: Colonoscopy. Completed 02/03/2021. Repeat every 10 years  Lung Cancer Screening: (Low Dose CT Chest recommended if Age 42-80 years, 30 pack-year currently smoking OR have quit w/in 15years.) does not qualify.    Additional Screening:  Hepatitis C Screening: does qualify; Completed 04/06/2019  Vision Screening: Recommended annual ophthalmology exams for early detection of glaucoma and other disorders of the eye. Is the patient up to date with their annual eye exam?  Yes  Who is the provider or what is the name of the office in which the patient attends annual eye exams? [facility]  Dental Screening: Recommended annual dental exams for proper oral hygiene  Community Resource Referral / Chronic Care Management: CRR required this visit?  No   CCM required this visit?  No      Plan:     I have personally reviewed and noted the following in the patient's chart:    Medical and social history Use of alcohol, tobacco or illicit drugs  Current medications and supplements including  opioid prescriptions. Patient is currently taking opioid prescriptions. Information provided to patient regarding non-opioid alternatives. Patient advised to discuss non-opioid treatment plan with their provider. Functional ability and status Nutritional status Physical activity Advanced directives List of other physicians Hospitalizations, surgeries, and ER visits in previous 12 months Vitals Screenings to include cognitive, depression, and falls Referrals and appointments  In addition, I have reviewed and discussed with patient certain preventive protocols, quality metrics, and best practice recommendations. A written personalized care plan for preventive services as well as general preventive health recommendations were provided to patient.   Due to this being a telephonic visit, the after visit summary with patients personalized plan was offered to patient via mail or my-chart. Patient would like to access their AVS via my-chart   Any medications not marked as taking were not mentioned during the medication reconciliation with patient.   Jordan Hawks Coran Dipaola, CMA   08/05/2022   Nurse Notes: Patient has had an eye exam. Results needed. Has had a Shingrix series and TDAP at [facility]. Please request records.

## 2022-08-05 NOTE — Telephone Encounter (Signed)
Patient has had his eye exam. Please request result from [facility] [place]  Patient had Shingrix series and TDAP both given at [facility]. Please request those records.   Thank you  Abby Semaj Kham, CMA  [facility] AWV Team Direct Dial: [phone]

## 2022-08-05 NOTE — Patient Instructions (Signed)
Thomas Rollins , Thank you for taking time to come for your Medicare Wellness Visit. I appreciate your ongoing commitment to your health goals. Please review the following plan we discussed and let me know if I can assist you in the future.   These are the goals we discussed:  Goals      Medication Management     Patient Goals/Self-Care Activities Over the next 90 days, patient will:  Take medications as prescribed Check blood sugar continuously with continuous glucose monitor, document, and provide at future appointments Engage in dietary modifications by fewer sweetened foods & beverages and watch portion sizes/amount of food eaten at one time           Patient Stated     Patient states that his goal is to continue to be able to work on his car.     Patient Stated     Patient states his goal is to lose weight due to severe hip and knee pain. His other goal is to come off of Metformin.      Protect My Health     Take care of myself.         This is a list of the screening recommended for you and due dates:  Health Maintenance  Topic Date Due   Eye exam for diabetics  Never done   DTaP/Tdap/Td vaccine (1 - Tdap) Never done   Zoster (Shingles) Vaccine (1 of 2) Never done   Yearly kidney health urinalysis for diabetes  08/14/2022   Flu Shot  09/24/2022   Yearly kidney function blood test for diabetes  12/10/2022   Hemoglobin A1C  12/22/2022   Complete foot exam   06/24/2023   Medicare Annual Wellness Visit  08/05/2023   Colon Cancer Screening  02/04/2031   Hepatitis C Screening  Completed   HIV Screening  Completed   HPV Vaccine  Aged Out   COVID-19 Vaccine  Discontinued    Advanced directives: Advance directive discussed with you today. Even though you declined this today, please call our office should you change your mind, and we can give you the proper paperwork for you to fill out. Advance care planning is a way to make decisions about medical care that fits your  values in case you are ever unable to make these decisions for yourself.  Information on Advanced Care Planning can be found at [facility] Advance Health Care Directives Advance Health Care Directives ([email or web address]    Conditions/risks identified:  Managing Pain Without Opioids  Opioids are strong medicines used to treat moderate to severe pain. For some people, especially those who have long-term (chronic) pain, opioids may not be the best choice for pain management due to: Side effects like nausea, constipation, and sleepiness. The risk of addiction (opioid use disorder). The longer you take opioids, the greater your risk of addiction. Pain that lasts for more than 3 months is called chronic pain. Managing chronic pain usually requires more than one approach and is often provided by a team of health care providers working together (multidisciplinary approach). Pain management may be done at a pain management center or pain clinic. How to manage pain without the use of opioids Use non-opioid medicines Non-opioid medicines for pain may include: Over-the-counter or prescription non-steroidal anti-inflammatory drugs (NSAIDs). These may be the first medicines used for pain. They work well for muscle and bone pain, and they reduce swelling. Acetaminophen. This over-the-counter medicine may work well for milder pain but  not swelling. Antidepressants. These may be used to treat chronic pain. A certain type of antidepressant (tricyclics) is often used. These medicines are given in lower doses for pain than when used for depression. Anticonvulsants. These are usually used to treat seizures but may also reduce nerve (neuropathic) pain. Muscle relaxants. These relieve pain caused by sudden muscle tightening (spasms). You may also use a pain medicine that is applied to the skin as a patch, cream, or gel (topical analgesic), such as a numbing medicine. These may cause fewer side effects  than medicines taken by mouth. Do certain therapies as directed Some therapies can help with pain management. They include: Physical therapy. You will do exercises to gain strength and flexibility. A physical therapist may teach you exercises to move and stretch parts of your body that are weak, stiff, or painful. You can learn these exercises at physical therapy visits and practice them at home. Physical therapy may also involve: Massage. Heat wraps or applying heat or cold to affected areas. Electrical signals that interrupt pain signals (transcutaneous electrical nerve stimulation, TENS). Weak lasers that reduce pain and swelling (low-level laser therapy). Signals from your body that help you learn to regulate pain (biofeedback). Occupational therapy. This helps you to learn ways to function at home and work with less pain. Recreational therapy. This involves trying new activities or hobbies, such as a physical activity or drawing. Mental health therapy, including: Cognitive behavioral therapy (CBT). This helps you learn coping skills for dealing with pain. Acceptance and commitment therapy (ACT) to change the way you think and react to pain. Relaxation therapies, including muscle relaxation exercises and mindfulness-based stress reduction. Pain management counseling. This may be individual, family, or group counseling.  Receive medical treatments Medical treatments for pain management include: Nerve block injections. These may include a pain blocker and anti-inflammatory medicines. You may have injections: Near the spine to relieve chronic back or neck pain. Into joints to relieve back or joint pain. Into nerve areas that supply a painful area to relieve body pain. Into muscles (trigger point injections) to relieve some painful muscle conditions. A medical device placed near your spine to help block pain signals and relieve nerve pain or chronic back pain (spinal cord stimulation  device). Acupuncture. Follow these instructions at home Medicines Take over-the-counter and prescription medicines only as told by your health care provider. If you are taking pain medicine, ask your health care providers about possible side effects to watch out for. Do not drive or use heavy machinery while taking prescription opioid pain medicine. Lifestyle  Do not use drugs or alcohol to reduce pain. If you drink alcohol, limit how much you have to: 0-1 drink a day for women who are not pregnant. 0-2 drinks a day for men. Know how much alcohol is in a drink. In the [place] one drink equals one 12 oz bottle of beer (355 mL), one 5 oz glass of wine (148 mL), or one 1 oz glass of hard liquor (44 mL). Do not use any products that contain nicotine or tobacco. These products include cigarettes, chewing tobacco, and vaping devices, such as e-cigarettes. If you need help quitting, ask your health care provider. Eat a healthy diet and maintain a healthy weight. Poor diet and excess weight may make pain worse. Eat foods that are high in fiber. These include fresh fruits and vegetables, whole grains, and beans. Limit foods that are high in fat and processed sugars, such as fried and sweet foods. Exercise regularly.  Exercise lowers stress and may help relieve pain. Ask your health care provider what activities and exercises are safe for you. If your health care provider approves, join an exercise class that combines movement and stress reduction. Examples include yoga and tai chi. Get enough sleep. Lack of sleep may make pain worse. Lower stress as much as possible. Practice stress reduction techniques as told by your therapist. General instructions Work with all your pain management providers to find the treatments that work best for you. You are an important member of your pain management team. There are many things you can do to reduce pain on your own. Consider joining an online or in-person  support group for people who have chronic pain. Keep all follow-up visits. This is important. Where to find more information You can find more information about managing pain without opioids from: American Academy of Pain Medicine: painmed.org Institute for Chronic Pain: instituteforchronicpain.org American Chronic Pain Association: theacpa.org Contact a health care provider if: You have side effects from pain medicine. Your pain gets worse or does not get better with treatments or home therapy. You are struggling with anxiety or depression. Summary Many types of pain can be managed without opioids. Chronic pain may respond better to pain management without opioids. Pain is best managed when you and a team of health care providers work together. Pain management without opioids may include non-opioid medicines, medical treatments, physical therapy, mental health therapy, and lifestyle changes. Tell your health care providers if your pain gets worse or is not being managed well enough. This information is not intended to replace advice given to you by your health care provider. Make sure you discuss any questions you have with your health care provider. Document Revised: 05/22/2020 Document Reviewed: 05/22/2020 Elsevier Patient Education  2023 Elsevier Inc.  Hip Pain The hip is the joint between the upper legs and the lower pelvis. The bones, cartilage, tendons, and muscles of your hip joint support your body and allow you to move around. Hip pain can range from a minor ache to severe pain in one or both of your hips. The pain may be felt on the inside of the hip joint near the groin, or on the outside near the buttocks and upper thigh. You may also have swelling or stiffness in your hip area. Follow these instructions at home: Managing pain, stiffness, and swelling     If told, put ice on the painful area. Put ice in a plastic bag. Place a towel between your skin and the bag. Leave the  ice on for 20 minutes, 2-3 times a day. If told, apply heat to the affected area as often as told by your health care provider. Use the heat source that your provider recommends, such as a moist heat pack or a heating pad. Place a towel between your skin and the heat source. Leave the heat on for 20-30 minutes. If your skin turns bright red, remove the ice or heat right away to prevent skin damage. The risk of damage is higher if you cannot feel pain, heat, or cold. Activity Do exercises as told by your provider. Avoid activities that cause pain. General instructions  Take over-the-counter and prescription medicines only as told by your provider. Keep a journal of your symptoms. Write down: How often you have hip pain. The location of your pain. What the pain feels like. What makes the pain worse. Sleep with a pillow between your legs on your most comfortable side. Keep all  follow-up visits. Your provider will monitor your pain and activity. Contact a health care provider if: You cannot put weight on your leg. Your pain or swelling gets worse after a week. It gets harder to walk. You have a fever. Get help right away if: You fall. You have a sudden increase in pain and swelling in your hip. Your hip is red or swollen or very tender to touch. This information is not intended to replace advice given to you by your health care provider. Make sure you discuss any questions you have with your health care provider. Document Revised: 10/14/2021 Document Reviewed: 10/14/2021 Elsevier Patient Education  2024 Elsevier Inc.   Next appointment: Follow up in one year for your annual wellness visit   August 11, 2023 at 8:30am  TELEPHONE VISIT  Preventive Care 40-64 Years, Male Preventive care refers to lifestyle choices and visits with your health care provider that can promote health and wellness. What does preventive care include? A yearly physical exam. This is also called an annual well  check. Dental exams once or twice a year. Routine eye exams. Ask your health care provider how often you should have your eyes checked. Personal lifestyle choices, including: Daily care of your teeth and gums. Regular physical activity. Eating a healthy diet. Avoiding tobacco and drug use. Limiting alcohol use. Practicing safe sex. Taking low-dose aspirin every day starting at age 44. What happens during an annual well check? The services and screenings done by your health care provider during your annual well check will depend on your age, overall health, lifestyle risk factors, and family history of disease. Counseling  Your health care provider may ask you questions about your: Alcohol use. Tobacco use. Drug use. Emotional well-being. Home and relationship well-being. Sexual activity. Eating habits. Work and work Astronomer. Screening  You may have the following tests or measurements: Height, weight, and BMI. Blood pressure. Lipid and cholesterol levels. These may be checked every 5 years, or more frequently if you are over 48 years old. Skin check. Lung cancer screening. You may have this screening every year starting at age 70 if you have a 30-pack-year history of smoking and currently smoke or have quit within the past 15 years. Fecal occult blood test (FOBT) of the stool. You may have this test every year starting at age 80. Flexible sigmoidoscopy or colonoscopy. You may have a sigmoidoscopy every 5 years or a colonoscopy every 10 years starting at age 65. Prostate cancer screening. Recommendations will vary depending on your family history and other risks. Hepatitis C blood test. Hepatitis B blood test. Sexually transmitted disease (STD) testing. Diabetes screening. This is done by checking your blood sugar (glucose) after you have not eaten for a while (fasting). You may have this done every 1-3 years. Discuss your test results, treatment options, and if necessary, the  need for more tests with your health care provider. Vaccines  Your health care provider may recommend certain vaccines, such as: Influenza vaccine. This is recommended every year. Tetanus, diphtheria, and acellular pertussis (Tdap, Td) vaccine. You may need a Td booster every 10 years. Zoster vaccine. You may need this after age 75. Pneumococcal 13-valent conjugate (PCV13) vaccine. You may need this if you have certain conditions and have not been vaccinated. Pneumococcal polysaccharide (PPSV23) vaccine. You may need one or two doses if you smoke cigarettes or if you have certain conditions. Talk to your health care provider about which screenings and vaccines you need and how often you  need them. This information is not intended to replace advice given to you by your health care provider. Make sure you discuss any questions you have with your health care provider. Document Released: 03/08/2015 Document Revised: 10/30/2015 Document Reviewed: 12/11/2014 Elsevier Interactive Patient Education  2017 [employer].  Fall Prevention in the Home Falls can cause injuries. They can happen to people of all ages. There are many things you can do to make your home safe and to help prevent falls. What can I do on the outside of my home? Regularly fix the edges of walkways and driveways and fix any cracks. Remove anything that might make you trip as you walk through a door, such as a raised step or threshold. Trim any bushes or trees on the path to your home. Use bright outdoor lighting. Clear any walking paths of anything that might make someone trip, such as rocks or tools. Regularly check to see if handrails are loose or broken. Make sure that both sides of any steps have handrails. Any raised decks and porches should have guardrails on the edges. Have any leaves, snow, or ice cleared regularly. Use sand or salt on walking paths during winter. Clean up any spills in your garage right away. This  includes oil or grease spills. What can I do in the bathroom? Use night lights. Install grab bars by the toilet and in the tub and shower. Do not use towel bars as grab bars. Use non-skid mats or decals in the tub or shower. If you need to sit down in the shower, use a plastic, non-slip stool. Keep the floor dry. Clean up any water that spills on the floor as soon as it happens. Remove soap buildup in the tub or shower regularly. Attach bath mats securely with double-sided non-slip rug tape. Do not have throw rugs and other things on the floor that can make you trip. What can I do in the bedroom? Use night lights. Make sure that you have a light by your bed that is easy to reach. Do not use any sheets or blankets that are too big for your bed. They should not hang down onto the floor. Have a firm chair that has side arms. You can use this for support while you get dressed. Do not have throw rugs and other things on the floor that can make you trip. What can I do in the kitchen? Clean up any spills right away. Avoid walking on wet floors. Keep items that you use a lot in easy-to-reach places. If you need to reach something above you, use a strong step stool that has a grab bar. Keep electrical cords out of the way. Do not use floor polish or wax that makes floors slippery. If you must use wax, use non-skid floor wax. Do not have throw rugs and other things on the floor that can make you trip. What can I do with my stairs? Do not leave any items on the stairs. Make sure that there are handrails on both sides of the stairs and use them. Fix handrails that are broken or loose. Make sure that handrails are as long as the stairways. Check any carpeting to make sure that it is firmly attached to the stairs. Fix any carpet that is loose or worn. Avoid having throw rugs at the top or bottom of the stairs. If you do have throw rugs, attach them to the floor with carpet tape. Make sure that you  have a light switch  at the top of the stairs and the bottom of the stairs. If you do not have them, ask someone to add them for you. What else can I do to help prevent falls? Wear shoes that: Do not have high heels. Have rubber bottoms. Are comfortable and fit you well. Are closed at the toe. Do not wear sandals. If you use a stepladder: Make sure that it is fully opened. Do not climb a closed stepladder. Make sure that both sides of the stepladder are locked into place. Ask someone to hold it for you, if possible. Clearly mark and make sure that you can see: Any grab bars or handrails. First and last steps. Where the edge of each step is. Use tools that help you move around (mobility aids) if they are needed. These include: Canes. Walkers. Scooters. Crutches. Turn on the lights when you go into a dark area. Replace any light bulbs as soon as they burn out. Set up your furniture so you have a clear path. Avoid moving your furniture around. If any of your floors are uneven, fix them. If there are any pets around you, be aware of where they are. Review your medicines with your doctor. Some medicines can make you feel dizzy. This can increase your chance of falling. Ask your doctor what other things that you can do to help prevent falls. This information is not intended to replace advice given to you by your health care provider. Make sure you discuss any questions you have with your health care provider. Document Released: 12/06/2008 Document Revised: 07/18/2015 Document Reviewed: 03/16/2014 Elsevier Interactive Patient Education  2017 [employer].

## 2022-08-05 NOTE — Telephone Encounter (Signed)
Spoke with patient and he stated that he does NOT want this done unless dixon thinks its necessary.

## 2022-08-06 ENCOUNTER — Encounter: Payer: Self-pay | Admitting: Internal Medicine

## 2022-08-06 ENCOUNTER — Ambulatory Visit (INDEPENDENT_AMBULATORY_CARE_PROVIDER_SITE_OTHER): Payer: Medicare HMO | Admitting: Internal Medicine

## 2022-08-06 VITALS — BP 115/70 | HR 77 | Ht 68.0 in | Wt 223.0 lb

## 2022-08-06 DIAGNOSIS — M25551 Pain in right hip: Secondary | ICD-10-CM | POA: Diagnosis not present

## 2022-08-06 DIAGNOSIS — E1165 Type 2 diabetes mellitus with hyperglycemia: Secondary | ICD-10-CM | POA: Diagnosis not present

## 2022-08-06 DIAGNOSIS — G8929 Other chronic pain: Secondary | ICD-10-CM

## 2022-08-06 DIAGNOSIS — M7918 Myalgia, other site: Secondary | ICD-10-CM

## 2022-08-06 DIAGNOSIS — M25552 Pain in left hip: Secondary | ICD-10-CM

## 2022-08-06 NOTE — Progress Notes (Signed)
Acute Office Visit  Subjective:     Patient ID: Thomas Rollins, male    DOB: 1960/10/03, 62 y.o.   MRN: [id number]  Chief Complaint  Patient presents with   medication review    Go over medications   Thomas Rollins presents today for an acute visit with multiple concerns to discuss.  His initial concern is wanting to discuss diabetes medications.  He is currently prescribed metformin 1000 mg twice daily and has recently been switched from Trul[employer] to [employer] for insurance coverage purposes.  He is concerned about continuing metformin as he has read negative things about metformin on the Internet.  His additional concern today is chronic left hip pain.  He is currently followed by pain management and has recently been titrated off of morphine to Suboxone.  He is not pleased with his current pain management provider as he feels they have not been professional and how they have communicated with him.  He is interested in establishing care with a new pain management provider and is also interested in a referral to orthopedic surgery to discuss additional treatment options.  Review of Systems  Musculoskeletal:  Positive for joint pain (Chronic left hip pain).  All other systems reviewed and are negative.     Objective:    BP 115/70   Pulse 77   Ht 5\' 8"  (1.727 m)   Wt 223 lb (101.2 kg)   SpO2 96%   BMI 33.91 kg/m   Physical Exam Vitals reviewed.  Constitutional:      General: He is not in acute distress.    Appearance: Normal appearance. He is not ill-appearing.  HENT:     Head: Normocephalic and atraumatic.     Right Ear: External ear normal.     Left Ear: External ear normal.     Nose: Nose normal. No congestion or rhinorrhea.     Mouth/Throat:     Mouth: Mucous membranes are moist.     Pharynx: Oropharynx is clear.  Eyes:     General: No scleral icterus.    Extraocular Movements: Extraocular movements intact.     Conjunctiva/sclera: Conjunctivae normal.     Pupils: Pupils  are equal, round, and reactive to light.  Cardiovascular:     Rate and Rhythm: Normal rate and regular rhythm.     Pulses: Normal pulses.     Heart sounds: Normal heart sounds. No murmur heard. Pulmonary:     Effort: Pulmonary effort is normal.     Breath sounds: Normal breath sounds. No wheezing, rhonchi or rales.  Abdominal:     General: Abdomen is flat. Bowel sounds are normal. There is no distension.     Palpations: Abdomen is soft.     Tenderness: There is no abdominal tenderness.  Musculoskeletal:        General: No swelling or deformity. Normal range of motion.     Cervical back: Normal range of motion.  Skin:    General: Skin is warm and dry.     Capillary Refill: Capillary refill takes less than 2 seconds.  Neurological:     General: No focal deficit present.     Mental Status: He is alert and oriented to person, place, and time.     Motor: No weakness.  Psychiatric:        Mood and Affect: Mood normal.        Behavior: Behavior normal.        Thought Content: Thought content normal.  Assessment & Plan:   Problem List Items Addressed This Visit       Uncontrolled type 2 diabetes mellitus with hyperglycemia (HCC)    A1c increased to 10.1 on labs from April.  He is currently prescribed metformin 1000 mg twice daily and has recently been switched from Trulicity to [employer] for insurance coverage.  His acute concern today is wanting to stop metformin as he has read negative things about metformin on the Internet. -I reviewed with Thomas Rollins that his diabetes is poorly controlled currently.  We are working to [employer], but my recommendation was that he continue metformin for now as I believe the benefits outweigh the risks.  He is in agreement with this plan.  His diabetes control improves, we will consider reducing metformin.      Chronic hip pain, bilateral    Followed by pain management for treatment of chronic bilateral hip pain.  His left hip has caused  more discomfort recently than the right.  Previously prescribed morphine, but has recently been tapered off and has started Suboxone.  He is not pleased with his current pain management provider due to a lack of professionalism and would like to establish care with a new provider.  X-rays of the left hip were obtained in January 2024 at [facility].  No acute findings were identified, but generalized enthesophyte formation was noted. -Pain management referral placed at patient's request -I have also placed referral to orthopedic surgery at the patient's request for further workup and management of chronic hip pain      Return in about 3 months (around 11/06/2022).  Thomas Lade, MD

## 2022-08-06 NOTE — Patient Instructions (Signed)
It was a pleasure to see you today.  Thank you for giving [place] the opportunity to be involved in your care.  Below is a brief recap of your visit and next steps.  We will plan to see you again in 3 months.  Summary No medication changes today. I recommend continuing metfpormin Increase Ozempic every 4 weeks Follow up in 3 months

## 2022-08-07 ENCOUNTER — Telehealth: Payer: Self-pay | Admitting: Internal Medicine

## 2022-08-07 DIAGNOSIS — L237 Allergic contact dermatitis due to plants, except food: Secondary | ICD-10-CM

## 2022-08-07 MED ORDER — TRIAMCINOLONE ACETONIDE 0.1 % EX CREA
1.0000 | TOPICAL_CREAM | Freq: Two times a day (BID) | CUTANEOUS | 0 refills | Status: AC
Start: 2022-08-07 — End: ?

## 2022-08-07 NOTE — Telephone Encounter (Signed)
Patient aware.

## 2022-08-07 NOTE — Telephone Encounter (Signed)
Pt called in forgot to mention poison ivy on foot at visit on 6/13  Started out small and has now spread all over foot. Wants to see if provider can send in something to  help.

## 2022-08-07 NOTE — Telephone Encounter (Signed)
Please let pt know medication sent to [facility].

## 2022-08-12 ENCOUNTER — Encounter: Payer: Self-pay | Admitting: Internal Medicine

## 2022-08-12 DIAGNOSIS — G8929 Other chronic pain: Secondary | ICD-10-CM | POA: Insufficient documentation

## 2022-08-12 NOTE — Assessment & Plan Note (Signed)
Followed by pain management for treatment of chronic bilateral hip pain.  His left hip has caused more discomfort recently than the right.  Previously prescribed morphine, but has recently been tapered off and has started Suboxone.  He is not pleased with his current pain management provider due to a lack of professionalism and would like to establish care with a new provider.  X-rays of the left hip were obtained in January 2024 at [facility].  No acute findings were identified, but generalized enthesophyte formation was noted. -Pain management referral placed at patient's request -I have also placed referral to orthopedic surgery at the patient's request for further workup and management of chronic hip pain

## 2022-08-12 NOTE — Assessment & Plan Note (Signed)
A1c increased to 10.1 on labs from April.  He is currently prescribed metformin 1000 mg twice daily and has recently been switched from [employer] to [employer] for insurance coverage.  His acute concern today is wanting to stop metformin as he has read negative things about metformin on the Internet. -I reviewed with Thomas Rollins that his diabetes is poorly controlled currently.  We are working to [employer], but my recommendation was that he continue metformin for now as I believe the benefits outweigh the risks.  He is in agreement with this plan.  His diabetes control improves, we will consider reducing metformin.

## 2022-08-12 NOTE — Telephone Encounter (Signed)
Per Alinda Money spoke to patient on 06.11.2024 patient confirm the condition and wanted to proceed to do these test. Needs permission from  the provider to proceed the test. Call back #  [phone] or cannot patient again regarding this.

## 2022-08-14 NOTE — Telephone Encounter (Signed)
Thomas Rollins from [facility] calling back would like for this form to be signed and received by end of today.

## 2022-08-14 NOTE — Telephone Encounter (Signed)
Lab Requisition Form received via fax for oral swab immunodeficiency panel test. Pending provider review.

## 2022-08-19 NOTE — Telephone Encounter (Signed)
Spoke with Alinda Money on 6/21 to communicate denial of oral swab. Provider did not place order for patient.

## 2022-08-20 ENCOUNTER — Telehealth: Payer: Self-pay | Admitting: Internal Medicine

## 2022-08-20 MED ORDER — OZEMPIC (0.25 OR 0.5 MG/DOSE) 2 MG/3ML ~~LOC~~ SOPN: 0.5000 mg | PEN_INJECTOR | SUBCUTANEOUS | 0 refills | Status: DC

## 2022-08-20 NOTE — Telephone Encounter (Signed)
Prescription Request  08/20/2022  LOV: 08/06/2022  What is the name of the medication or equipment? Semaglutide,0.25 or 0.5MG /DOS, (OZEMPIC, 0.25 OR 0.5 MG/DOSE,) 2 MG/3ML SOPN [   PATIENT NEED TO UP DOSE   Have you contacted your pharmacy to request a refill? No   Which pharmacy would you like this sent to?   [facility] [place], [place] - [address] [place] [address] Phone: [phone] Fax: [phone]  Phone: [phone] Fax: [phone]    Patient notified that their request is being sent to the clinical staff for review and that they should receive a response within 2 business days.   Please advise at Mobile [phone] (mobile)

## 2022-08-21 DIAGNOSIS — G894 Chronic pain syndrome: Secondary | ICD-10-CM | POA: Diagnosis not present

## 2022-08-21 DIAGNOSIS — M545 Low back pain, unspecified: Secondary | ICD-10-CM | POA: Diagnosis not present

## 2022-08-21 DIAGNOSIS — Z79891 Long term (current) use of opiate analgesic: Secondary | ICD-10-CM | POA: Diagnosis not present

## 2022-08-21 DIAGNOSIS — M25559 Pain in unspecified hip: Secondary | ICD-10-CM | POA: Diagnosis not present

## 2022-08-21 DIAGNOSIS — M2550 Pain in unspecified joint: Secondary | ICD-10-CM | POA: Diagnosis not present

## 2022-08-21 DIAGNOSIS — M542 Cervicalgia: Secondary | ICD-10-CM | POA: Diagnosis not present

## 2022-08-21 DIAGNOSIS — G8929 Other chronic pain: Secondary | ICD-10-CM | POA: Diagnosis not present

## 2022-08-21 DIAGNOSIS — Z79899 Other long term (current) drug therapy: Secondary | ICD-10-CM | POA: Diagnosis not present

## 2022-08-22 ENCOUNTER — Other Ambulatory Visit: Payer: Self-pay | Admitting: Nurse Practitioner

## 2022-08-23 DIAGNOSIS — E1140 Type 2 diabetes mellitus with diabetic neuropathy, unspecified: Secondary | ICD-10-CM | POA: Diagnosis not present

## 2022-09-03 ENCOUNTER — Ambulatory Visit: Payer: Medicare HMO | Admitting: Orthopedic Surgery

## 2022-09-16 ENCOUNTER — Other Ambulatory Visit (HOSPITAL_COMMUNITY): Payer: Self-pay | Admitting: Psychiatry

## 2022-09-16 ENCOUNTER — Other Ambulatory Visit: Payer: Self-pay | Admitting: Internal Medicine

## 2022-09-16 DIAGNOSIS — F331 Major depressive disorder, recurrent, moderate: Secondary | ICD-10-CM

## 2022-09-16 DIAGNOSIS — G4700 Insomnia, unspecified: Secondary | ICD-10-CM

## 2022-09-16 DIAGNOSIS — F4310 Post-traumatic stress disorder, unspecified: Secondary | ICD-10-CM

## 2022-09-16 DIAGNOSIS — F410 Panic disorder [episodic paroxysmal anxiety] without agoraphobia: Secondary | ICD-10-CM

## 2022-09-17 DIAGNOSIS — Z79891 Long term (current) use of opiate analgesic: Secondary | ICD-10-CM | POA: Diagnosis not present

## 2022-09-17 DIAGNOSIS — G894 Chronic pain syndrome: Secondary | ICD-10-CM | POA: Diagnosis not present

## 2022-09-17 DIAGNOSIS — Z79899 Other long term (current) drug therapy: Secondary | ICD-10-CM | POA: Diagnosis not present

## 2022-09-21 ENCOUNTER — Other Ambulatory Visit: Payer: Self-pay | Admitting: Nurse Practitioner

## 2022-09-21 DIAGNOSIS — M792 Neuralgia and neuritis, unspecified: Secondary | ICD-10-CM

## 2022-09-24 ENCOUNTER — Ambulatory Visit: Payer: Medicare HMO | Admitting: Internal Medicine

## 2022-10-07 DIAGNOSIS — E118 Type 2 diabetes mellitus with unspecified complications: Secondary | ICD-10-CM | POA: Diagnosis not present

## 2022-10-07 DIAGNOSIS — E1169 Type 2 diabetes mellitus with other specified complication: Secondary | ICD-10-CM | POA: Diagnosis not present

## 2022-10-08 ENCOUNTER — Other Ambulatory Visit: Payer: Self-pay | Admitting: Nurse Practitioner

## 2022-10-08 DIAGNOSIS — E119 Type 2 diabetes mellitus without complications: Secondary | ICD-10-CM

## 2022-10-14 ENCOUNTER — Other Ambulatory Visit: Payer: Self-pay | Admitting: Internal Medicine

## 2022-10-16 ENCOUNTER — Other Ambulatory Visit: Payer: Self-pay

## 2022-10-16 DIAGNOSIS — E1165 Type 2 diabetes mellitus with hyperglycemia: Secondary | ICD-10-CM

## 2022-10-16 DIAGNOSIS — E785 Hyperlipidemia, unspecified: Secondary | ICD-10-CM

## 2022-10-16 DIAGNOSIS — E1169 Type 2 diabetes mellitus with other specified complication: Secondary | ICD-10-CM | POA: Diagnosis not present

## 2022-10-18 LAB — LIPID PANEL
Chol/HDL Ratio: 3.4 ratio (ref 0.0–5.0)
Cholesterol, Total: 147 mg/dL (ref 100–199)
HDL: 43 mg/dL (ref >39)
LDL Chol Calc (NIH): 75 mg/dL (ref 0–99)
Triglycerides: 173 mg/dL — ABNORMAL HIGH (ref 0–149)
VLDL Cholesterol Cal: 29 mg/dL (ref 5–40)

## 2022-10-18 LAB — MICROALBUMIN / CREATININE URINE RATIO
Creatinine, Urine: 80 mg/dL
Microalb/Creat Ratio: 4 mg/g{creat} (ref 0–29)
Microalbumin, Urine: 3.4 ug/mL

## 2022-10-18 LAB — HEMOGLOBIN A1C
Est. average glucose Bld gHb Est-mCnc: 298 mg/dL
Hgb A1c MFr Bld: 12 % — ABNORMAL HIGH (ref 4.8–5.6)

## 2022-10-20 ENCOUNTER — Ambulatory Visit: Payer: Medicare HMO | Admitting: Internal Medicine

## 2022-10-22 ENCOUNTER — Other Ambulatory Visit (HOSPITAL_COMMUNITY): Payer: Self-pay | Admitting: Psychiatry

## 2022-10-22 ENCOUNTER — Telehealth: Payer: Self-pay

## 2022-10-22 DIAGNOSIS — F331 Major depressive disorder, recurrent, moderate: Secondary | ICD-10-CM

## 2022-10-22 DIAGNOSIS — G8929 Other chronic pain: Secondary | ICD-10-CM | POA: Diagnosis not present

## 2022-10-22 DIAGNOSIS — M545 Low back pain, unspecified: Secondary | ICD-10-CM | POA: Diagnosis not present

## 2022-10-22 DIAGNOSIS — F4310 Post-traumatic stress disorder, unspecified: Secondary | ICD-10-CM

## 2022-10-22 DIAGNOSIS — M542 Cervicalgia: Secondary | ICD-10-CM | POA: Diagnosis not present

## 2022-10-22 DIAGNOSIS — Z79891 Long term (current) use of opiate analgesic: Secondary | ICD-10-CM | POA: Diagnosis not present

## 2022-10-22 DIAGNOSIS — M25559 Pain in unspecified hip: Secondary | ICD-10-CM | POA: Diagnosis not present

## 2022-10-22 DIAGNOSIS — F410 Panic disorder [episodic paroxysmal anxiety] without agoraphobia: Secondary | ICD-10-CM

## 2022-10-22 DIAGNOSIS — G894 Chronic pain syndrome: Secondary | ICD-10-CM | POA: Diagnosis not present

## 2022-10-22 DIAGNOSIS — M25551 Pain in right hip: Secondary | ICD-10-CM

## 2022-10-22 DIAGNOSIS — M2550 Pain in unspecified joint: Secondary | ICD-10-CM | POA: Diagnosis not present

## 2022-10-22 MED ORDER — CYCLOBENZAPRINE HCL 10 MG PO TABS: 10.0000 mg | ORAL_TABLET | Freq: Two times a day (BID) | ORAL | 2 refills | Status: DC | PRN

## 2022-10-22 NOTE — Telephone Encounter (Signed)
Pt called requesting a refill on his cyclobenzaprine (FLEXERIL) 10 MG tablet. Pt states that he is going out of town for a month and is leaving tomorrow. Pt was last seen on 08/23/2022. Pt would like for this to be sent to [facility].

## 2022-10-22 NOTE — Addendum Note (Signed)
Addended by: Christel Mormon E on: 10/22/2022 11:58 AM   Modules accepted: Orders

## 2022-10-24 DIAGNOSIS — E1140 Type 2 diabetes mellitus with diabetic neuropathy, unspecified: Secondary | ICD-10-CM | POA: Diagnosis not present

## 2022-10-27 ENCOUNTER — Encounter: Payer: Self-pay | Admitting: Internal Medicine

## 2022-10-28 ENCOUNTER — Ambulatory Visit: Payer: Medicare HMO | Admitting: Internal Medicine

## 2022-11-17 ENCOUNTER — Telehealth: Payer: Self-pay | Admitting: Internal Medicine

## 2022-11-17 NOTE — Telephone Encounter (Signed)
Form faxed back on 11/16/2022

## 2022-11-17 NOTE — Telephone Encounter (Signed)
[facility] calling for update on Rx status faxed yesterday. [phone] Thanks

## 2022-11-19 DIAGNOSIS — M25559 Pain in unspecified hip: Secondary | ICD-10-CM | POA: Diagnosis not present

## 2022-11-19 DIAGNOSIS — M545 Low back pain, unspecified: Secondary | ICD-10-CM | POA: Diagnosis not present

## 2022-11-19 DIAGNOSIS — M542 Cervicalgia: Secondary | ICD-10-CM | POA: Diagnosis not present

## 2022-11-19 DIAGNOSIS — M2550 Pain in unspecified joint: Secondary | ICD-10-CM | POA: Diagnosis not present

## 2022-11-19 DIAGNOSIS — G8929 Other chronic pain: Secondary | ICD-10-CM | POA: Diagnosis not present

## 2022-11-19 DIAGNOSIS — Z79891 Long term (current) use of opiate analgesic: Secondary | ICD-10-CM | POA: Diagnosis not present

## 2022-11-19 DIAGNOSIS — G894 Chronic pain syndrome: Secondary | ICD-10-CM | POA: Diagnosis not present

## 2022-11-20 ENCOUNTER — Other Ambulatory Visit: Payer: Self-pay | Admitting: Internal Medicine

## 2022-11-20 ENCOUNTER — Other Ambulatory Visit: Payer: Self-pay | Admitting: Nurse Practitioner

## 2022-11-20 DIAGNOSIS — E1165 Type 2 diabetes mellitus with hyperglycemia: Secondary | ICD-10-CM

## 2022-11-20 DIAGNOSIS — I10 Essential (primary) hypertension: Secondary | ICD-10-CM

## 2022-11-23 IMAGING — US US ABDOMEN COMPLETE
1 series · 13 of 25 positions shown · non-contrast
Comparison: None

CLINICAL DATA: A 60-year-old male presents following fall with
pain.

EXAM:
ABDOMEN ULTRASOUND COMPLETE

[Series 1: us abdomen complete · 13 of 155 slices shown]
[im 1/155]
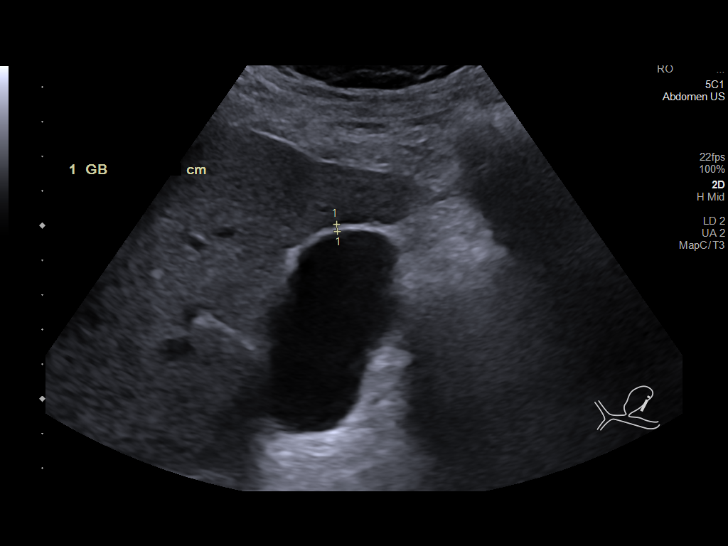
[im 13/155]
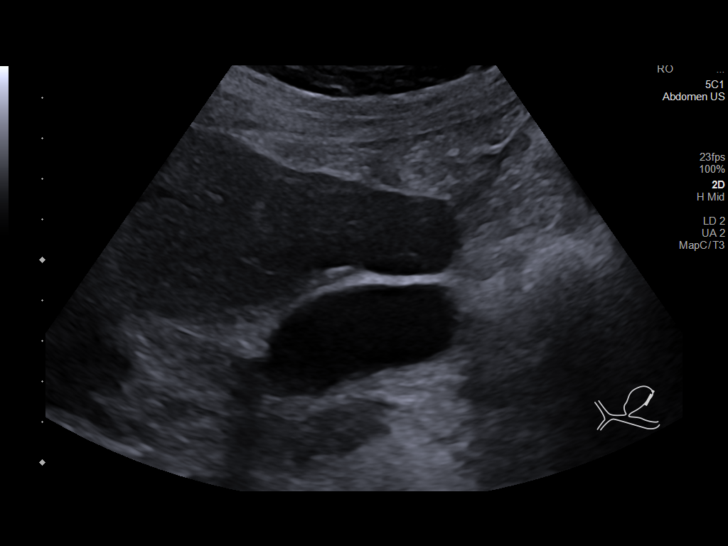
[im 26/155]
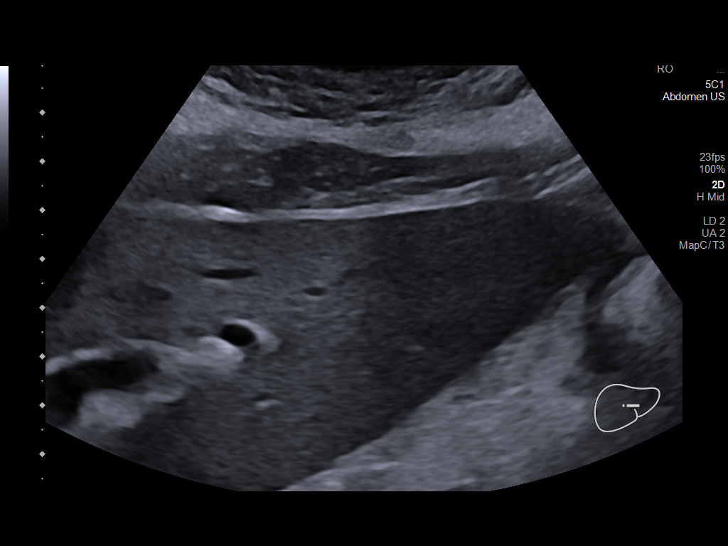
[im 39/155]
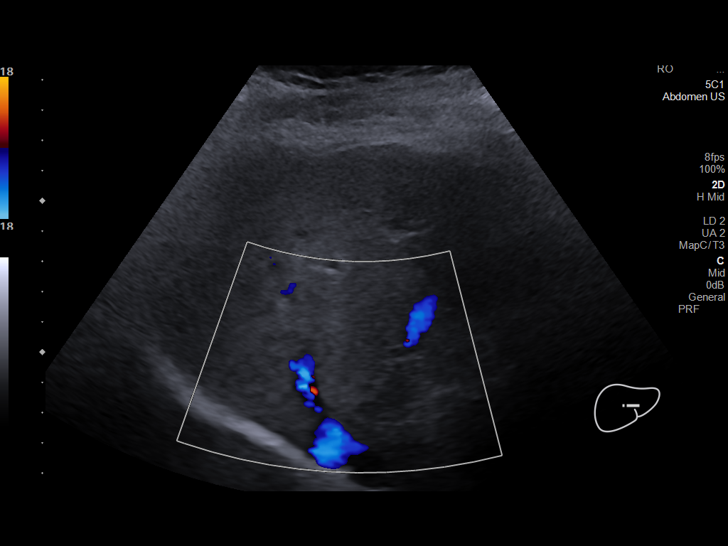
[im 52/155]
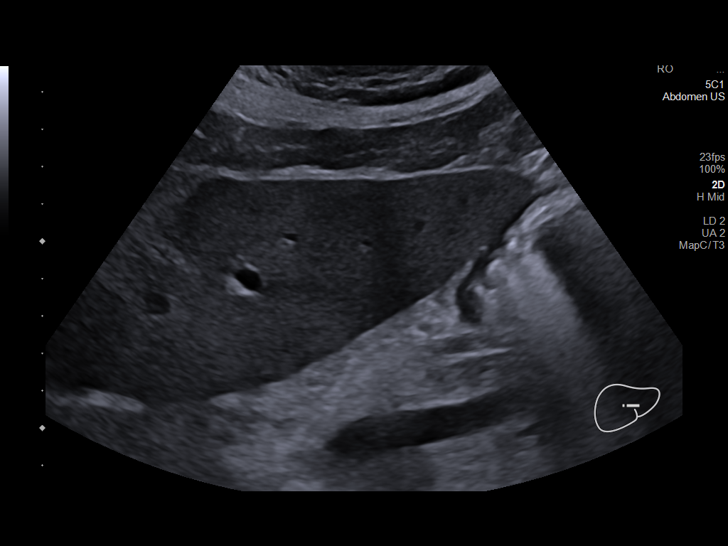
[im 65/155]
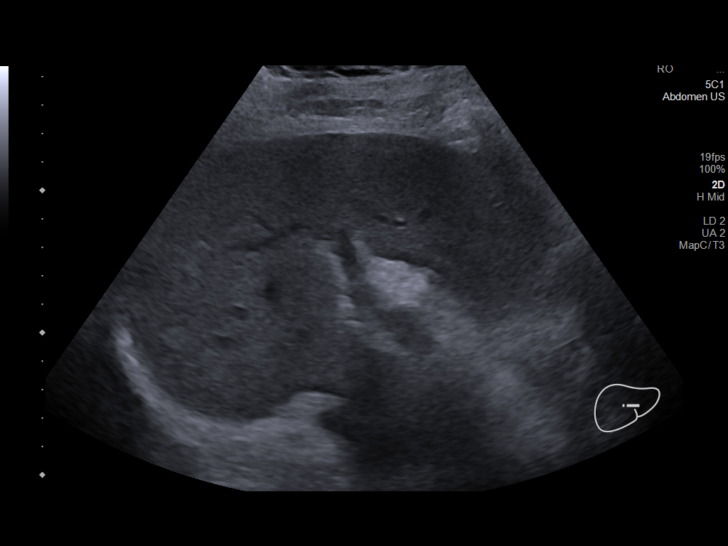
[im 78/155]
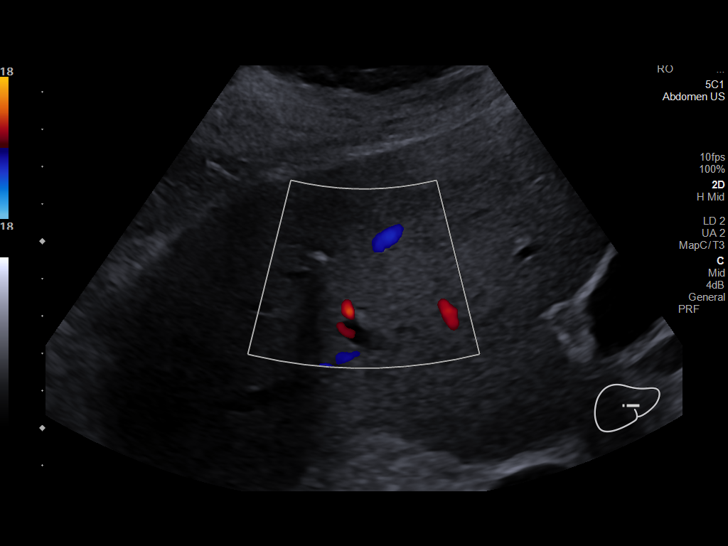
[im 90/155]
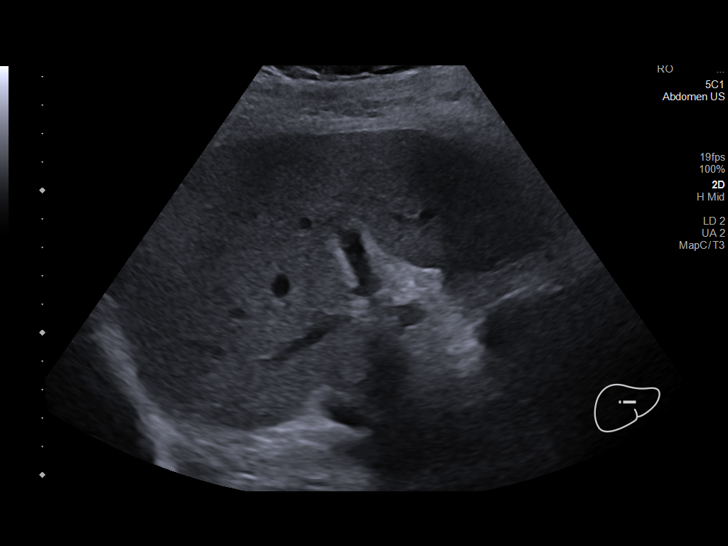
[im 103/155]
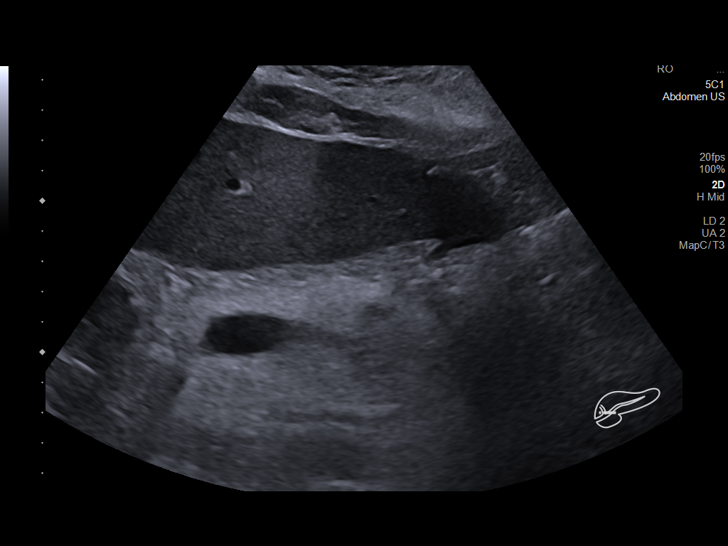
[im 116/155]
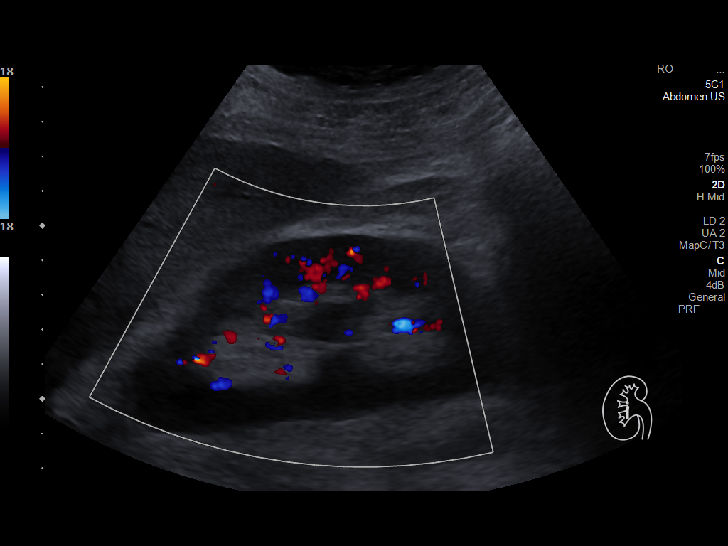
[im 129/155]
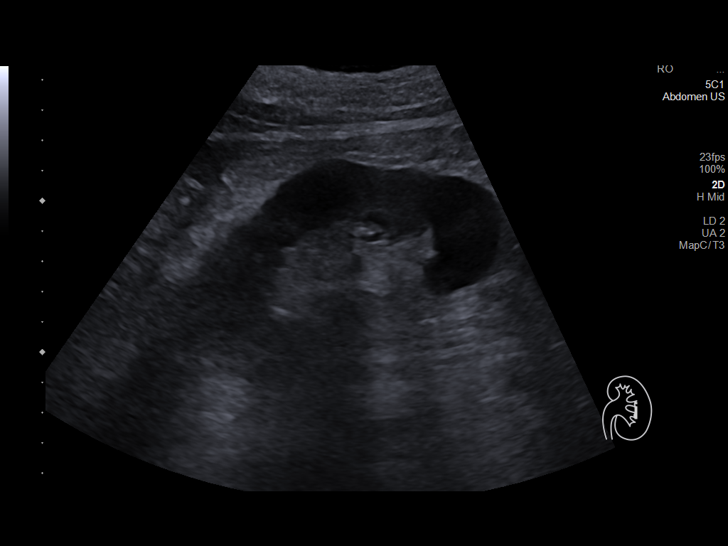
[im 142/155]
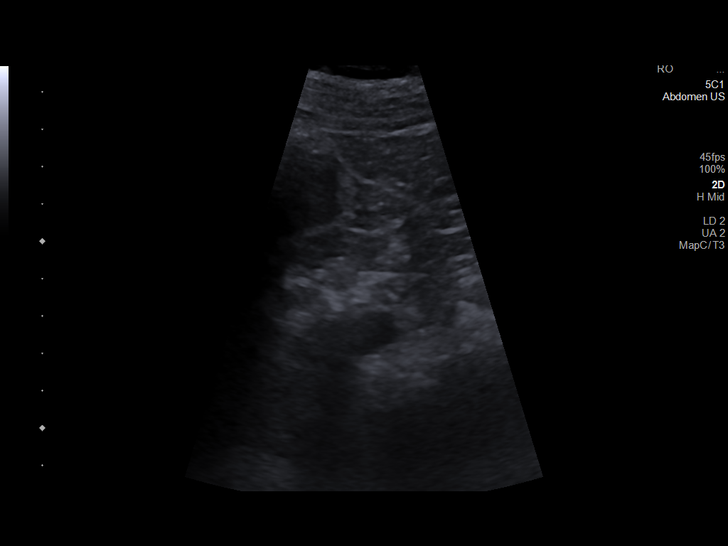
[im 155/155]
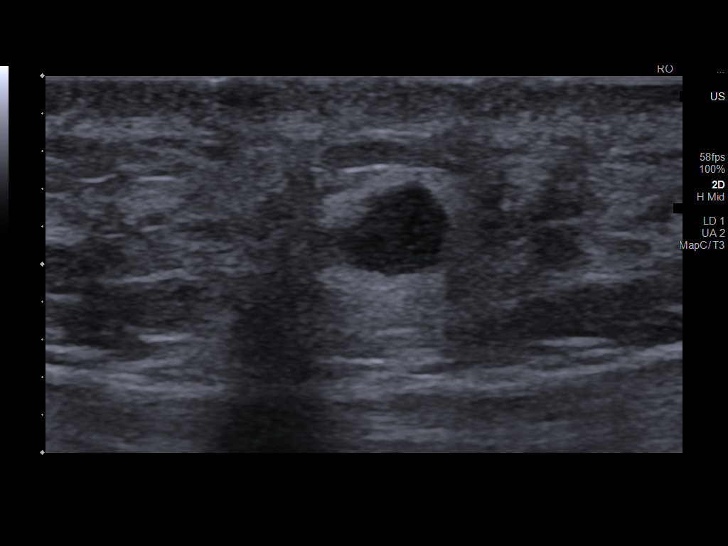

[13 of 25 positions shown; findings below may reference images not displayed]

FINDINGS: Gallbladder: No gallstones or wall thickening visualized. No
sonographic Murphy sign noted by sonographer.

Common bile duct: Diameter: 7.3 mm, no intrahepatic biliary duct
distension.

Liver: Small calcification in liver parenchyma in the RIGHT hepatic
lobe measuring 5 x 5 x 2 mm. Fissural widening, suggestion of
caudate enlargement and nodular hepatic contour. Portal vein is
patent on color Doppler imaging with normal direction of blood flow
towards the liver.

IVC: No abnormality visualized.

Pancreas: Visualized portion unremarkable.

Spleen: Un enlarged and normal in appearance.

Right Kidney: Length: 11.0 cm. Echogenicity within normal limits. No
mass or hydronephrosis visualized.

Left Kidney: Length: 12.1 cm. Echogenicity within normal limits. No
mass or hydronephrosis visualized.

Abdominal aorta: Irregularity of the abdominal aorta suggesting
atherosclerotic plaque. No sign of aneurysm.

Other findings: 0.9 x 0.7 x 0.6 cm nearly anechoic area surrounded
by subcutaneous fat in the anterior abdominal wall without visible
internal flow.
IMPRESSION: Findings that may represent a small abdominal wall hematoma. Would
correlate with any signs of infection. Would follow on a clinical
basis with repeat imaging if there is evidence of enlargement.

Signs of liver disease/cirrhosis but without gross signs of portal
hypertension. Correlation with history is suggested with hepatology
follow-up if not yet obtained.

Mild dilation of the common bile duct. This is of uncertain
significance. Correlate with clinical and laboratory values and any
RIGHT upper quadrant pain. Consider follow-up cross-sectional
imaging as warranted. This could also be helpful in light of liver
findings.

## 2022-11-30 NOTE — Telephone Encounter (Signed)
[facility] calling says they are missing last OV note wanting it faxed to [phone]

## 2022-12-01 NOTE — Telephone Encounter (Signed)
Re-faxed.

## 2022-12-03 ENCOUNTER — Encounter: Payer: Self-pay | Admitting: Internal Medicine

## 2022-12-03 ENCOUNTER — Ambulatory Visit: Payer: Medicare HMO | Admitting: Internal Medicine

## 2022-12-03 VITALS — BP 133/85 | HR 79 | Ht 68.0 in | Wt 214.0 lb

## 2022-12-03 DIAGNOSIS — I10 Essential (primary) hypertension: Secondary | ICD-10-CM | POA: Diagnosis not present

## 2022-12-03 DIAGNOSIS — E1165 Type 2 diabetes mellitus with hyperglycemia: Secondary | ICD-10-CM

## 2022-12-03 DIAGNOSIS — Z7984 Long term (current) use of oral hypoglycemic drugs: Secondary | ICD-10-CM

## 2022-12-03 DIAGNOSIS — M1250 Traumatic arthropathy, unspecified site: Secondary | ICD-10-CM | POA: Diagnosis not present

## 2022-12-03 DIAGNOSIS — Z23 Encounter for immunization: Secondary | ICD-10-CM | POA: Diagnosis not present

## 2022-12-03 DIAGNOSIS — F331 Major depressive disorder, recurrent, moderate: Secondary | ICD-10-CM | POA: Diagnosis not present

## 2022-12-03 DIAGNOSIS — G4700 Insomnia, unspecified: Secondary | ICD-10-CM

## 2022-12-03 DIAGNOSIS — F411 Generalized anxiety disorder: Secondary | ICD-10-CM

## 2022-12-03 DIAGNOSIS — F410 Panic disorder [episodic paroxysmal anxiety] without agoraphobia: Secondary | ICD-10-CM | POA: Diagnosis not present

## 2022-12-03 DIAGNOSIS — F4310 Post-traumatic stress disorder, unspecified: Secondary | ICD-10-CM

## 2022-12-03 MED ORDER — SEMAGLUTIDE (2 MG/DOSE) 8 MG/3ML ~~LOC~~ SOPN: 2.0000 mg | PEN_INJECTOR | SUBCUTANEOUS | 2 refills | Status: DC

## 2022-12-03 MED ORDER — DICLOFENAC SODIUM 3 % EX GEL: 1.0000 g | Freq: Four times a day (QID) | CUTANEOUS | 0 refills | Status: DC | PRN

## 2022-12-03 MED ORDER — MIRTAZAPINE 15 MG PO TABS
15.0000 mg | ORAL_TABLET | Freq: Every day | ORAL | 1 refills | Status: DC
Start: 2022-12-03 — End: 2023-04-28

## 2022-12-03 NOTE — Assessment & Plan Note (Signed)
Mirtazapine 15 mg nightly has been refilled today.

## 2022-12-03 NOTE — Progress Notes (Signed)
Established Patient Office Visit  Subjective   Patient ID: Thomas Rollins, male    DOB: 1960-11-01  Age: 62 y.o. MRN: [id number]  Chief Complaint  Patient presents with   Muscle Pain    F/u   Thomas Rollins returns to care today for routine follow-up.  He was last evaluated by me on 6/13 for an acute visit to discuss diabetes medications.  He also requested new referrals to pain management and orthopedic surgery.  There have been no acute interval events.  Thomas Rollins reports feeling fairly well today.  He is asymptomatic and has no acute concerns to discuss.  Past Medical History:  Diagnosis Date   Abnormal LFTs    Abnormal LFTs    Anxiety    panic attacks   Chronic pain    Depression    Diabetes mellitus without complication (HCC)    Encounter for hepatitis C screening test for low risk patient 04/05/2019   Encounter for screening colonoscopy    Hyperlipidemia    Hypertension    Hypothyroidism    Sleep apnea    TIA (transient ischemic attack) 2019   Past Surgical History:  Procedure Laterality Date   ankle sx     ARTHROSCOPIC REPAIR ACL     CARPAL TUNNEL RELEASE     COLONOSCOPY WITH PROPOFOL N/A 02/03/2021   Procedure: COLONOSCOPY WITH PROPOFOL;  Surgeon: Lanelle Bal, DO;  Location: [facility];  Service: Endoscopy;  Laterality: N/A;  8:00am   HERNIA REPAIR     SHOULDER ARTHROSCOPY     Social History   Tobacco Use   Smoking status: Former    Types: Cigarettes   Smokeless tobacco: Never  Vaping Use   Vaping status: Never Used  Substance Use Topics   Alcohol use: Not Currently    Comment: Heavy use from ages [age] with 8 or 9 tonic based beverages daily.  Cut back significantly on adulthood with last drink Christmas 23 to avoid smoking marijuana   Drug use: Not Currently    Types: Marijuana, Mescaline, Psilocybin    Comment: Quit smoking pot in 2023 having previously used since teenage years.  One-time use mescaline and one-time use of mushrooms in his youth    Family History  Problem Relation Age of Onset   Stomach cancer Mother 51   Heart Problems Father    Dementia Father    Cancer Father        "tumors"   Bone cancer Brother    Lung cancer Brother    Cancer Daughter 95       Male cancer   Cancer Daughter 80       Male   Colon cancer Neg Hx    Allergies  Allergen Reactions   Codeine Other (See Comments)    Slurred Speech    Other     [place] - N/V    sweat potatoes - N/V  [place] - N/V   sweat potatoes - N/V   Penicillins Rash   Review of Systems  Constitutional:  Negative for chills and fever.  HENT:  Negative for sore throat.   Respiratory:  Negative for cough and shortness of breath.   Cardiovascular:  Negative for chest pain, palpitations and leg swelling.  Gastrointestinal:  Negative for abdominal pain, blood in stool, constipation, diarrhea, nausea and vomiting.  Genitourinary:  Negative for dysuria and hematuria.  Musculoskeletal:  Negative for myalgias.  Skin:  Negative for itching and rash.  Neurological:  Negative for dizziness  and headaches.  Psychiatric/Behavioral:  Negative for depression and suicidal ideas.      Objective:     BP 133/85 (BP Location: Right Arm, Patient Position: Sitting, Cuff Size: Normal)   Pulse 79   Ht 5\' 8"  (1.727 m)   Wt 214 lb 0.6 oz (97.1 kg)   SpO2 97%   BMI 32.54 kg/m  BP Readings from Last 3 Encounters:  12/03/22 133/85  08/06/22 115/70  06/24/22 121/77   Physical Exam Vitals reviewed.  Constitutional:      General: He is not in acute distress.    Appearance: Normal appearance. He is not ill-appearing.  HENT:     Head: Normocephalic and atraumatic.     Right Ear: External ear normal.     Left Ear: External ear normal.     Nose: Nose normal. No congestion or rhinorrhea.     Mouth/Throat:     Mouth: Mucous membranes are moist.     Pharynx: Oropharynx is clear.  Eyes:     General: No scleral icterus.    Extraocular Movements: Extraocular movements intact.      Conjunctiva/sclera: Conjunctivae normal.     Pupils: Pupils are equal, round, and reactive to light.  Cardiovascular:     Rate and Rhythm: Normal rate and regular rhythm.     Pulses: Normal pulses.     Heart sounds: Normal heart sounds. No murmur heard. Pulmonary:     Effort: Pulmonary effort is normal.     Breath sounds: Normal breath sounds. No wheezing, rhonchi or rales.  Abdominal:     General: Abdomen is flat. Bowel sounds are normal. There is no distension.     Palpations: Abdomen is soft.     Tenderness: There is no abdominal tenderness.  Musculoskeletal:        General: No swelling or deformity. Normal range of motion.     Cervical back: Normal range of motion.  Skin:    General: Skin is warm and dry.     Capillary Refill: Capillary refill takes less than 2 seconds.  Neurological:     General: No focal deficit present.     Mental Status: He is alert and oriented to person, place, and time.     Motor: No weakness.  Psychiatric:        Mood and Affect: Mood normal.        Behavior: Behavior normal.        Thought Content: Thought content normal.   Last CBC Lab Results  Component Value Date   WBC 5.8 06/22/2022   HGB 13.0 06/22/2022   HCT 41.3 06/22/2022   MCV 88 06/22/2022   MCH 27.6 06/22/2022   RDW 14.3 06/22/2022   PLT 202 06/22/2022   Last metabolic panel Lab Results  Component Value Date   GLUCOSE 231 (H) 12/09/2021   NA 140 12/09/2021   K 4.7 12/09/2021   CL 101 12/09/2021   CO2 20 12/09/2021   BUN 16 12/09/2021   CREATININE 0.89 12/09/2021   EGFR 96 12/09/2021   CALCIUM 9.8 12/09/2021   PROT 7.4 12/09/2021   ALBUMIN 5.0 (H) 12/09/2021   LABGLOB 2.4 12/09/2021   AGRATIO 2.1 12/09/2021   BILITOT 0.5 12/09/2021   ALKPHOS 77 12/09/2021   AST 22 12/09/2021   ALT 28 12/09/2021   ANIONGAP 8 01/31/2021   Last lipids Lab Results  Component Value Date   CHOL 147 10/16/2022   HDL 43 10/16/2022   LDLCALC 75 10/16/2022   TRIG 173 (  H) 10/16/2022    CHOLHDL 3.4 10/16/2022   Last hemoglobin A1c Lab Results  Component Value Date   HGBA1C 12.0 (H) 10/16/2022   Last thyroid functions Lab Results  Component Value Date   TSH 0.915 12/09/2021   Last vitamin D Lab Results  Component Value Date   VD25OH 38.8 03/18/2022   The 10-year ASCVD risk score (Arnett DK, et al., 2019) is: 23.2%    Assessment & Plan:   Problem List Items Addressed This Visit       Essential hypertension    Remains adequately controlled on lisinopril 10 mg daily.  No medication changes are indicated today.      Uncontrolled type 2 diabetes mellitus with hyperglycemia (HCC) - Primary    Poorly controlled.  A1c has increased to 12.0 on labs from August.  He is currently prescribed metformin 1000 mg twice daily and Ozempic 1 mg weekly.  He reports taking medications as prescribed. -Increase Ozempic to 2 mg weekly.  Continue metformin 1000 mg twice daily. -Lifestyle modifications aimed at improving his blood sugar were reviewed. -We will tentatively plan for follow-up in late February/early March and repeat A1c at that time.  We discussed that additional medications, most likely insulin, will need to be added if there is no significant improvement in his A1c at that time. -Repeat CMP ordered today      Insomnia, unspecified    Mirtazapine 15 mg nightly has been refilled today.      Need for immunization against influenza    Influenza vaccine administered today       Return in about 5 months (around 05/03/2023).    Billie Lade, MD

## 2022-12-03 NOTE — Assessment & Plan Note (Signed)
Remains adequately controlled on lisinopril 10 mg daily.  No medication changes are indicated today.

## 2022-12-03 NOTE — Assessment & Plan Note (Signed)
Influenza vaccine administered today.

## 2022-12-03 NOTE — Assessment & Plan Note (Signed)
Poorly controlled.  A1c has increased to 12.0 on labs from August.  He is currently prescribed metformin 1000 mg twice daily and Ozempic 1 mg weekly.  He reports taking medications as prescribed. -Increase Ozempic to 2 mg weekly.  Continue metformin 1000 mg twice daily. -Lifestyle modifications aimed at improving his blood sugar were reviewed. -We will tentatively plan for follow-up in late February/early March and repeat A1c at that time.  We discussed that additional medications, most likely insulin, will need to be added if there is no significant improvement in his A1c at that time. -Repeat CMP ordered today

## 2022-12-03 NOTE — Patient Instructions (Signed)
It was a pleasure to see you today.  Thank you for giving [place] the opportunity to be involved in your care.  Below is a brief recap of your visit and next steps.  We will plan to see you again in 5 months.  Summary Increase Ozempic to 2 mg weekly Medications refilled Flu shot today Check chemistry panel today Follow up in late February / early March

## 2022-12-04 LAB — CMP14+EGFR
ALT: 41 [IU]/L (ref 0–44)
AST: 34 [IU]/L (ref 0–40)
Albumin: 4.7 g/dL (ref 3.9–4.9)
Alkaline Phosphatase: 108 [IU]/L (ref 44–121)
BUN/Creatinine Ratio: 25 — ABNORMAL HIGH (ref 10–24)
BUN: 25 mg/dL (ref 8–27)
Bilirubin Total: 0.3 mg/dL (ref 0.0–1.2)
CO2: 21 mmol/L (ref 20–29)
Calcium: 10.5 mg/dL — ABNORMAL HIGH (ref 8.6–10.2)
Chloride: 91 mmol/L — ABNORMAL LOW (ref 96–106)
Creatinine, Ser: 1 mg/dL (ref 0.76–1.27)
Globulin, Total: 2.6 g/dL (ref 1.5–4.5)
Glucose: 390 mg/dL — ABNORMAL HIGH (ref 70–99)
Potassium: 5.2 mmol/L (ref 3.5–5.2)
Sodium: 131 mmol/L — ABNORMAL LOW (ref 134–144)
Total Protein: 7.3 g/dL (ref 6.0–8.5)
eGFR: 84 mL/min/{1.73_m2} (ref >59)

## 2022-12-17 DIAGNOSIS — M25512 Pain in left shoulder: Secondary | ICD-10-CM | POA: Diagnosis not present

## 2022-12-17 DIAGNOSIS — M25562 Pain in left knee: Secondary | ICD-10-CM | POA: Diagnosis not present

## 2022-12-17 DIAGNOSIS — M545 Low back pain, unspecified: Secondary | ICD-10-CM | POA: Diagnosis not present

## 2022-12-17 DIAGNOSIS — G894 Chronic pain syndrome: Secondary | ICD-10-CM | POA: Diagnosis not present

## 2022-12-17 DIAGNOSIS — Z79891 Long term (current) use of opiate analgesic: Secondary | ICD-10-CM | POA: Diagnosis not present

## 2022-12-17 DIAGNOSIS — M25551 Pain in right hip: Secondary | ICD-10-CM | POA: Diagnosis not present

## 2022-12-17 DIAGNOSIS — G8929 Other chronic pain: Secondary | ICD-10-CM | POA: Diagnosis not present

## 2022-12-17 DIAGNOSIS — M542 Cervicalgia: Secondary | ICD-10-CM | POA: Diagnosis not present

## 2022-12-24 DIAGNOSIS — E1140 Type 2 diabetes mellitus with diabetic neuropathy, unspecified: Secondary | ICD-10-CM | POA: Diagnosis not present

## 2022-12-24 DIAGNOSIS — I10 Essential (primary) hypertension: Secondary | ICD-10-CM | POA: Diagnosis not present

## 2023-01-14 DIAGNOSIS — G894 Chronic pain syndrome: Secondary | ICD-10-CM | POA: Diagnosis not present

## 2023-01-14 DIAGNOSIS — M542 Cervicalgia: Secondary | ICD-10-CM | POA: Diagnosis not present

## 2023-01-14 DIAGNOSIS — M25559 Pain in unspecified hip: Secondary | ICD-10-CM | POA: Diagnosis not present

## 2023-01-14 DIAGNOSIS — Z79891 Long term (current) use of opiate analgesic: Secondary | ICD-10-CM | POA: Diagnosis not present

## 2023-01-14 DIAGNOSIS — M545 Low back pain, unspecified: Secondary | ICD-10-CM | POA: Diagnosis not present

## 2023-01-14 DIAGNOSIS — M2550 Pain in unspecified joint: Secondary | ICD-10-CM | POA: Diagnosis not present

## 2023-01-23 DIAGNOSIS — E1140 Type 2 diabetes mellitus with diabetic neuropathy, unspecified: Secondary | ICD-10-CM | POA: Diagnosis not present

## 2023-01-23 DIAGNOSIS — I10 Essential (primary) hypertension: Secondary | ICD-10-CM | POA: Diagnosis not present

## 2023-01-30 ENCOUNTER — Other Ambulatory Visit: Payer: Self-pay | Admitting: Internal Medicine

## 2023-01-30 DIAGNOSIS — M1250 Traumatic arthropathy, unspecified site: Secondary | ICD-10-CM

## 2023-02-10 DIAGNOSIS — G4733 Obstructive sleep apnea (adult) (pediatric): Secondary | ICD-10-CM | POA: Diagnosis not present

## 2023-02-16 DIAGNOSIS — G8929 Other chronic pain: Secondary | ICD-10-CM | POA: Diagnosis not present

## 2023-02-16 DIAGNOSIS — M25559 Pain in unspecified hip: Secondary | ICD-10-CM | POA: Diagnosis not present

## 2023-02-16 DIAGNOSIS — M545 Low back pain, unspecified: Secondary | ICD-10-CM | POA: Diagnosis not present

## 2023-02-16 DIAGNOSIS — Z79891 Long term (current) use of opiate analgesic: Secondary | ICD-10-CM | POA: Diagnosis not present

## 2023-02-16 DIAGNOSIS — M2550 Pain in unspecified joint: Secondary | ICD-10-CM | POA: Diagnosis not present

## 2023-02-16 DIAGNOSIS — G894 Chronic pain syndrome: Secondary | ICD-10-CM | POA: Diagnosis not present

## 2023-02-23 DIAGNOSIS — E1140 Type 2 diabetes mellitus with diabetic neuropathy, unspecified: Secondary | ICD-10-CM | POA: Diagnosis not present

## 2023-02-23 DIAGNOSIS — I10 Essential (primary) hypertension: Secondary | ICD-10-CM | POA: Diagnosis not present

## 2023-02-26 ENCOUNTER — Ambulatory Visit: Payer: Self-pay | Admitting: Internal Medicine

## 2023-02-26 NOTE — Telephone Encounter (Signed)
 Copied from CRM [id number]. Topic: Clinical - Red Word Triage >> Feb 26, 2023  3:55 PM Carlatta H wrote: [facility] that prompted transfer to Nurse Triage: Patient is having extreme pain in left arm//Pain for about 5 days//Patient fell about 5 days ago and landed on his hands//  Chief Complaint: left arm pain Symptoms: arm pain Frequency: had a fall 5 days ago Pertinent Negatives: Patient denies fever, neck pain and swelling Disposition: [] ED /[x] Urgent Care (no appt availability in office) / [] Appointment(In office/virtual)/ []  [place] [facility]/ [] Home Care/ [] Refused Recommended Disposition /[] [facility]/ []  Follow-up with PCP Additional Notes: states pain can be a 10/10 with just movement of arm.  Instructed to go to UC.  Patient declines UC today and states will go tomorrow.  Has questions about doubling muscle relaxer.  Instructed to go to ER if becomes worse.   Reason for Disposition  [1] Arm pains with exertion (e.g., walking) AND [2] pain goes away on resting AND [3] not present now  Answer Assessment - Initial Assessment Questions 1. ONSET: When did the pain start?     After fall 5 days 2. LOCATION: Where is the pain located?     Left arm 3. PAIN: How bad is the pain? (Scale 1-10; or mild, moderate, severe)   - MILD (1-3): Doesn't interfere with normal activities.   - MODERATE (4-7): Interferes with normal activities (e.g., work or school) or awakens from sleep.   - SEVERE (8-10): Excruciating pain, unable to do any normal activities, unable to hold a cup of water.     10/10 5. CAUSE: What do you think is causing the arm pain?     Fall 5 days ago 6. OTHER SYMPTOMS: Do you have any other symptoms? (e.g., neck pain, swelling, rash, fever, numbness, weakness)     Smacked head too, states a goat threw him and hit his head  Protocols used: Arm Pain-A-AH

## 2023-03-13 DIAGNOSIS — M25529 Pain in unspecified elbow: Secondary | ICD-10-CM | POA: Diagnosis not present

## 2023-03-13 DIAGNOSIS — S5012XA Contusion of left forearm, initial encounter: Secondary | ICD-10-CM | POA: Diagnosis not present

## 2023-03-13 DIAGNOSIS — M25522 Pain in left elbow: Secondary | ICD-10-CM | POA: Diagnosis not present

## 2023-03-13 DIAGNOSIS — S0990XA Unspecified injury of head, initial encounter: Secondary | ICD-10-CM | POA: Diagnosis not present

## 2023-03-13 DIAGNOSIS — M25539 Pain in unspecified wrist: Secondary | ICD-10-CM | POA: Diagnosis not present

## 2023-03-13 DIAGNOSIS — M19032 Primary osteoarthritis, left wrist: Secondary | ICD-10-CM | POA: Diagnosis not present

## 2023-03-13 DIAGNOSIS — M79622 Pain in left upper arm: Secondary | ICD-10-CM | POA: Diagnosis not present

## 2023-03-13 DIAGNOSIS — W5589XA Other contact with other mammals, initial encounter: Secondary | ICD-10-CM | POA: Diagnosis not present

## 2023-03-13 DIAGNOSIS — M79632 Pain in left forearm: Secondary | ICD-10-CM | POA: Diagnosis not present

## 2023-03-13 DIAGNOSIS — W5532XA Struck by other hoof stock, initial encounter: Secondary | ICD-10-CM | POA: Diagnosis not present

## 2023-03-25 DIAGNOSIS — G8929 Other chronic pain: Secondary | ICD-10-CM | POA: Diagnosis not present

## 2023-03-25 DIAGNOSIS — G894 Chronic pain syndrome: Secondary | ICD-10-CM | POA: Diagnosis not present

## 2023-03-25 DIAGNOSIS — M2550 Pain in unspecified joint: Secondary | ICD-10-CM | POA: Diagnosis not present

## 2023-03-25 DIAGNOSIS — Z79891 Long term (current) use of opiate analgesic: Secondary | ICD-10-CM | POA: Diagnosis not present

## 2023-03-25 DIAGNOSIS — M25559 Pain in unspecified hip: Secondary | ICD-10-CM | POA: Diagnosis not present

## 2023-03-26 DIAGNOSIS — I10 Essential (primary) hypertension: Secondary | ICD-10-CM | POA: Diagnosis not present

## 2023-03-26 DIAGNOSIS — E1140 Type 2 diabetes mellitus with diabetic neuropathy, unspecified: Secondary | ICD-10-CM | POA: Diagnosis not present

## 2023-04-06 DIAGNOSIS — H524 Presbyopia: Secondary | ICD-10-CM | POA: Diagnosis not present

## 2023-04-08 ENCOUNTER — Other Ambulatory Visit: Payer: Self-pay

## 2023-04-08 ENCOUNTER — Other Ambulatory Visit: Payer: Self-pay | Admitting: Internal Medicine

## 2023-04-08 DIAGNOSIS — F4310 Post-traumatic stress disorder, unspecified: Secondary | ICD-10-CM

## 2023-04-08 DIAGNOSIS — F331 Major depressive disorder, recurrent, moderate: Secondary | ICD-10-CM

## 2023-04-08 DIAGNOSIS — F410 Panic disorder [episodic paroxysmal anxiety] without agoraphobia: Secondary | ICD-10-CM

## 2023-04-08 DIAGNOSIS — M25551 Pain in right hip: Secondary | ICD-10-CM

## 2023-04-08 MED ORDER — DULOXETINE HCL 30 MG PO CPEP: 30.0000 mg | ORAL_CAPSULE | Freq: Three times a day (TID) | ORAL | 0 refills | Status: DC

## 2023-04-08 NOTE — Telephone Encounter (Signed)
Copied from CRM [id number]. Topic: Clinical - Medication Refill >> Apr 08, 2023  2:22 PM Gildardo Pounds wrote: Most Recent Primary Care Visit:  Provider: Christel Mormon E  Department: [facility]  Visit Type: OFFICE VISIT  Date: 12/03/2022  Medication: DULoxetine (CYMBALTA) 30 MG capsule  Has the patient contacted their pharmacy? Yes (Agent: If no, request that the patient contact the pharmacy for the refill. If patient does not wish to contact the pharmacy document the reason why and proceed with request.) (Agent: If yes, when and what did the pharmacy advise?)  Is this the correct pharmacy for this prescription? Yes If no, delete pharmacy and type the correct one.  This is the patient's preferred pharmacy:  [facility] - [place], [place] - [address] [place] [address] Phone: [phone] Fax: [phone]  Has the prescription been filled recently? No  Is the patient out of the medication? Yes  Has the patient been seen for an appointment in the last year OR does the patient have an upcoming appointment? Yes  Can we respond through MyChart? Yes  Agent: Please be advised that Rx refills may take up to 3 business days. We ask that you follow-up with your pharmacy.

## 2023-04-23 DIAGNOSIS — G894 Chronic pain syndrome: Secondary | ICD-10-CM | POA: Diagnosis not present

## 2023-04-23 DIAGNOSIS — M542 Cervicalgia: Secondary | ICD-10-CM | POA: Diagnosis not present

## 2023-04-23 DIAGNOSIS — M545 Low back pain, unspecified: Secondary | ICD-10-CM | POA: Diagnosis not present

## 2023-04-23 DIAGNOSIS — E1140 Type 2 diabetes mellitus with diabetic neuropathy, unspecified: Secondary | ICD-10-CM | POA: Diagnosis not present

## 2023-04-23 DIAGNOSIS — I10 Essential (primary) hypertension: Secondary | ICD-10-CM | POA: Diagnosis not present

## 2023-04-23 DIAGNOSIS — M2550 Pain in unspecified joint: Secondary | ICD-10-CM | POA: Diagnosis not present

## 2023-04-23 DIAGNOSIS — Z79891 Long term (current) use of opiate analgesic: Secondary | ICD-10-CM | POA: Diagnosis not present

## 2023-04-26 DIAGNOSIS — E1140 Type 2 diabetes mellitus with diabetic neuropathy, unspecified: Secondary | ICD-10-CM | POA: Diagnosis not present

## 2023-04-26 DIAGNOSIS — I10 Essential (primary) hypertension: Secondary | ICD-10-CM | POA: Diagnosis not present

## 2023-04-28 ENCOUNTER — Other Ambulatory Visit: Payer: Self-pay | Admitting: Internal Medicine

## 2023-04-28 DIAGNOSIS — G4700 Insomnia, unspecified: Secondary | ICD-10-CM

## 2023-04-28 DIAGNOSIS — F4310 Post-traumatic stress disorder, unspecified: Secondary | ICD-10-CM

## 2023-04-28 DIAGNOSIS — E1165 Type 2 diabetes mellitus with hyperglycemia: Secondary | ICD-10-CM

## 2023-04-28 DIAGNOSIS — I10 Essential (primary) hypertension: Secondary | ICD-10-CM

## 2023-04-28 DIAGNOSIS — F410 Panic disorder [episodic paroxysmal anxiety] without agoraphobia: Secondary | ICD-10-CM

## 2023-04-28 DIAGNOSIS — F331 Major depressive disorder, recurrent, moderate: Secondary | ICD-10-CM

## 2023-05-02 ENCOUNTER — Other Ambulatory Visit: Payer: Self-pay | Admitting: Nurse Practitioner

## 2023-05-15 ENCOUNTER — Other Ambulatory Visit: Payer: Self-pay | Admitting: Internal Medicine

## 2023-05-15 DIAGNOSIS — E1165 Type 2 diabetes mellitus with hyperglycemia: Secondary | ICD-10-CM

## 2023-05-21 DIAGNOSIS — M542 Cervicalgia: Secondary | ICD-10-CM | POA: Diagnosis not present

## 2023-05-21 DIAGNOSIS — Z79891 Long term (current) use of opiate analgesic: Secondary | ICD-10-CM | POA: Diagnosis not present

## 2023-05-21 DIAGNOSIS — G894 Chronic pain syndrome: Secondary | ICD-10-CM | POA: Diagnosis not present

## 2023-05-21 DIAGNOSIS — M2550 Pain in unspecified joint: Secondary | ICD-10-CM | POA: Diagnosis not present

## 2023-05-21 DIAGNOSIS — M545 Low back pain, unspecified: Secondary | ICD-10-CM | POA: Diagnosis not present

## 2023-05-24 DIAGNOSIS — E1140 Type 2 diabetes mellitus with diabetic neuropathy, unspecified: Secondary | ICD-10-CM | POA: Diagnosis not present

## 2023-05-24 DIAGNOSIS — I10 Essential (primary) hypertension: Secondary | ICD-10-CM | POA: Diagnosis not present

## 2023-05-26 DIAGNOSIS — I10 Essential (primary) hypertension: Secondary | ICD-10-CM | POA: Diagnosis not present

## 2023-05-26 DIAGNOSIS — E1140 Type 2 diabetes mellitus with diabetic neuropathy, unspecified: Secondary | ICD-10-CM | POA: Diagnosis not present

## 2023-06-14 ENCOUNTER — Other Ambulatory Visit: Payer: Self-pay | Admitting: Internal Medicine

## 2023-06-14 DIAGNOSIS — F410 Panic disorder [episodic paroxysmal anxiety] without agoraphobia: Secondary | ICD-10-CM

## 2023-06-14 DIAGNOSIS — M25551 Pain in right hip: Secondary | ICD-10-CM

## 2023-06-14 DIAGNOSIS — F331 Major depressive disorder, recurrent, moderate: Secondary | ICD-10-CM

## 2023-06-14 DIAGNOSIS — F4310 Post-traumatic stress disorder, unspecified: Secondary | ICD-10-CM

## 2023-06-14 NOTE — Telephone Encounter (Signed)
 Copied from CRM [id number]. Topic: Clinical - Medication Refill >> Jun 14, 2023  4:35 PM Leory Rands wrote: Most Recent Primary Care Visit:  Provider: DIXON, PHILLIP E  Department: [facility]  Visit Type: OFFICE VISIT  Date: 12/03/2022  Medication: DULoxetine  (CYMBALTA ) 30 MG capsule [[id number]]  Has the patient contacted their pharmacy? Yes (Agent: If no, request that the patient contact the pharmacy for the refill. If patient does not wish to contact the pharmacy document the reason why and proceed with request.) (Agent: If yes, when and what did the pharmacy advise?)  Is this the correct pharmacy for this prescription? Yes If no, delete pharmacy and type the correct one.  This is the patient's preferred pharmacy:  [facility] - [place], [place] - [address] [place] [address] Phone: [phone] Fax: [phone]   Has the prescription been filled recently? Yes  Is the patient out of the medication? Yes  Has the patient been seen for an appointment in the last year OR does the patient have an upcoming appointment? Yes  Can we respond through MyChart? Yes  Agent: Please be advised that Rx refills may take up to 3 business days. We ask that you follow-up with your pharmacy.

## 2023-06-15 MED ORDER — DULOXETINE HCL 30 MG PO CPEP: 30.0000 mg | ORAL_CAPSULE | Freq: Three times a day (TID) | ORAL | 0 refills | Status: DC

## 2023-06-18 DIAGNOSIS — Z79891 Long term (current) use of opiate analgesic: Secondary | ICD-10-CM | POA: Diagnosis not present

## 2023-06-18 DIAGNOSIS — M542 Cervicalgia: Secondary | ICD-10-CM | POA: Diagnosis not present

## 2023-06-18 DIAGNOSIS — M25559 Pain in unspecified hip: Secondary | ICD-10-CM | POA: Diagnosis not present

## 2023-06-18 DIAGNOSIS — M2550 Pain in unspecified joint: Secondary | ICD-10-CM | POA: Diagnosis not present

## 2023-06-18 DIAGNOSIS — G894 Chronic pain syndrome: Secondary | ICD-10-CM | POA: Diagnosis not present

## 2023-06-23 DIAGNOSIS — I10 Essential (primary) hypertension: Secondary | ICD-10-CM | POA: Diagnosis not present

## 2023-06-23 DIAGNOSIS — E1140 Type 2 diabetes mellitus with diabetic neuropathy, unspecified: Secondary | ICD-10-CM | POA: Diagnosis not present

## 2023-07-01 ENCOUNTER — Other Ambulatory Visit: Payer: Self-pay | Admitting: Internal Medicine

## 2023-07-01 DIAGNOSIS — M1250 Traumatic arthropathy, unspecified site: Secondary | ICD-10-CM

## 2023-07-17 ENCOUNTER — Other Ambulatory Visit: Payer: Self-pay | Admitting: Internal Medicine

## 2023-07-18 ENCOUNTER — Other Ambulatory Visit: Payer: Self-pay | Admitting: Internal Medicine

## 2023-07-18 DIAGNOSIS — M792 Neuralgia and neuritis, unspecified: Secondary | ICD-10-CM

## 2023-07-22 DIAGNOSIS — M2550 Pain in unspecified joint: Secondary | ICD-10-CM | POA: Diagnosis not present

## 2023-07-22 DIAGNOSIS — Z79891 Long term (current) use of opiate analgesic: Secondary | ICD-10-CM | POA: Diagnosis not present

## 2023-07-22 DIAGNOSIS — M25559 Pain in unspecified hip: Secondary | ICD-10-CM | POA: Diagnosis not present

## 2023-07-22 DIAGNOSIS — M542 Cervicalgia: Secondary | ICD-10-CM | POA: Diagnosis not present

## 2023-07-22 DIAGNOSIS — M545 Low back pain, unspecified: Secondary | ICD-10-CM | POA: Diagnosis not present

## 2023-07-22 DIAGNOSIS — G894 Chronic pain syndrome: Secondary | ICD-10-CM | POA: Diagnosis not present

## 2023-07-24 DIAGNOSIS — I10 Essential (primary) hypertension: Secondary | ICD-10-CM | POA: Diagnosis not present

## 2023-07-24 DIAGNOSIS — E1140 Type 2 diabetes mellitus with diabetic neuropathy, unspecified: Secondary | ICD-10-CM | POA: Diagnosis not present

## 2023-07-29 ENCOUNTER — Encounter: Payer: Self-pay | Admitting: Family Medicine

## 2023-07-29 ENCOUNTER — Ambulatory Visit (INDEPENDENT_AMBULATORY_CARE_PROVIDER_SITE_OTHER): Admitting: Family Medicine

## 2023-07-29 VITALS — BP 133/72 | HR 72 | Resp 16 | Ht 68.5 in | Wt 230.1 lb

## 2023-07-29 DIAGNOSIS — S61451A Open bite of right hand, initial encounter: Secondary | ICD-10-CM | POA: Diagnosis not present

## 2023-07-29 DIAGNOSIS — W540XXA Bitten by dog, initial encounter: Secondary | ICD-10-CM

## 2023-07-29 DIAGNOSIS — S61459A Open bite of unspecified hand, initial encounter: Secondary | ICD-10-CM | POA: Insufficient documentation

## 2023-07-29 MED ORDER — METRONIDAZOLE 500 MG PO TABS: 500.0000 mg | ORAL_TABLET | Freq: Three times a day (TID) | ORAL | 0 refills | Status: AC

## 2023-07-29 MED ORDER — SULFAMETHOXAZOLE-TRIMETHOPRIM 800-160 MG PO TABS: 1.0000 | ORAL_TABLET | Freq: Two times a day (BID) | ORAL | 0 refills | Status: AC

## 2023-07-29 NOTE — Progress Notes (Addendum)
 Acute Office Visit  Subjective:    Patient ID: Thomas Rollins, male    DOB: [birth year], 63 y.o.   MRN: [id number]  Chief Complaint  Patient presents with   Animal Bite    Was bitten on his right hand by a dog yesterday afternoon while trying to catch a stray dog.     HPI Patient is in today with the above complaints and denies tenderness, fever and warmth at the affect site.  Past Medical History:  Diagnosis Date   Abnormal LFTs    Abnormal LFTs    Anxiety    panic attacks   Chronic pain    Depression    Diabetes mellitus without complication (HCC)    Encounter for hepatitis C screening test for low risk patient 04/05/2019   Encounter for screening colonoscopy    Hyperlipidemia    Hypertension    Hypothyroidism    Sleep apnea    TIA (transient ischemic attack) 2019    Past Surgical History:  Procedure Laterality Date   ankle sx     ARTHROSCOPIC REPAIR ACL     CARPAL TUNNEL RELEASE     COLONOSCOPY WITH PROPOFOL  N/A 02/03/2021   Procedure: COLONOSCOPY WITH PROPOFOL ;  Surgeon: Vinetta Greening, DO;  Location: [facility];  Service: Endoscopy;  Laterality: N/A;  8:00am   HERNIA REPAIR     SHOULDER ARTHROSCOPY      Family History  Problem Relation Age of Onset   Stomach cancer Mother 31   Heart Problems Father    Dementia Father    Cancer Father        tumors   Bone cancer Brother    Lung cancer Brother    Cancer Daughter 85       Male cancer   Cancer Daughter 10       Male   Colon cancer Neg Hx     Social History   Socioeconomic History   Marital status: Widowed    Spouse name: Not on file   Number of children: 1   Years of education: Not on file   Highest education level: 12th grade  Occupational History   Not on file  Tobacco Use   Smoking status: Former    Types: Cigarettes   Smokeless tobacco: Never  Vaping Use   Vaping status: Never Used  Substance and Sexual Activity   Alcohol use: Not Currently    Comment: Heavy use from ages  [age] with 8 or 9 tonic based beverages daily.  Cut back significantly on adulthood with last drink Christmas 23 to avoid smoking marijuana   Drug use: Not Currently    Types: Marijuana, Mescaline, Psilocybin    Comment: Quit smoking pot in 2023 having previously used since teenage years.  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   Takes time release morphine b/c fell 15-20 feet from a building while working.    Now disabled        Exercises/walks: 7 days a week   Has pets at home: 5 dogs in the home.      Diet: Avoids white foods, fruits, veggies, does eat some fast food/eat out  Caffeine: none only hot chocolate or soda when stomach is upset   Water: 8 cups daily if not more      Wears seat belt   Smoke detectors and carbon monoxide detectors at home    Does not use phone while driving                      Social Drivers of Corporate investment banker Strain: Low Risk  (08/05/2022)   Overall Financial Resource Strain (CARDIA)    Difficulty of Paying Living Expenses: Not hard at all  Food Insecurity: No Food Insecurity (08/05/2022)   Hunger Vital Sign    Worried About Running Out of Food in the Last Year: Never true    Ran Out of Food in the Last Year: Never true  Transportation Needs: No Transportation Needs (08/05/2022)   PRAPARE - Administrator, Civil Service (Medical): No    Lack of Transportation (Non-Medical): No  Physical Activity: Sufficiently Active (08/05/2022)   Exercise Vital Sign    Days of Exercise per Week: 7 days    Minutes of Exercise per Session: 30 min  Stress: No Stress Concern Present (08/05/2022)   [employer] of Occupational Health - Occupational Stress Questionnaire     Feeling of Stress : Not at all  Social Connections: Socially Isolated (08/05/2022)   Social Connection and Isolation Panel    Frequency of Communication with Friends and Family: Twice a week    Frequency of Social Gatherings with Friends and Family: Twice a week    Attends Religious Services: Never    Database administrator or Organizations: No    Attends Banker Meetings: Never    Marital Status: Never married  Intimate Partner Violence: Not At Risk (08/05/2022)   Humiliation, Afraid, Rape, and Kick questionnaire    Fear of Current or Ex-Partner: No    Emotionally Abused: No    Physically Abused: No    Sexually Abused: No    Outpatient Medications Prior to Visit  Medication Sig Dispense Refill   blood glucose meter kit and supplies Dispense based on patient and insurance preference. Use up to four times daily as directed. (FOR ICD-10 E10.9, E11.9). 1 each 0   Buprenorphine HCl-Naloxone HCl 8-2 MG FILM Place 8 mg of opioid under the tongue every 12 (twelve) hours.     cyclobenzaprine  (FLEXERIL ) 10 MG tablet Take 1 tablet (10 mg total) by mouth 2 (two) times daily as needed. 30 tablet 2   Diclofenac  Sodium 3 % GEL apply 1 gram TOPICALLY FOUR TIMES DAILY AS NEEDED FOR PAIN 100 g 1   DULoxetine  (CYMBALTA ) 30 MG capsule Take 1 capsule (30 mg total) by mouth 3 (three) times daily. 270 capsule 0   gabapentin  (NEURONTIN ) 300 MG capsule TAKE ONE CAPSULE BY MOUTH TWICE DAILY (ONLY taking one daily) 60 capsule 6   GLOBAL EASE INJECT PEN NEEDLES 31G X 8 MM MISC Use to inject insulin  once daily 100 each 3   Glucagon  (GVOKE HYPOPEN  2-PACK) 1 MG/0.2ML SOAJ Inject 1 mg into the skin daily as needed. 0.4 mL 0   levothyroxine  (SYNTHROID ) 100 MCG tablet Take 1 tablet (100 mcg total) by mouth daily. 90 tablet 0   lisinopril  (ZESTRIL ) 10 MG tablet TAKE 1 TABLET BY MOUTH DAILY 90 tablet 3   meloxicam  (MOBIC ) 7.5 MG tablet TAKE 1 TABLET BY MOUTH DAILY 90 tablet 3   metFORMIN  (GLUCOPHAGE ) 1000 MG  tablet TAKE 1  TABLET BY MOUTH TWICE DAILY 180 tablet 3   metoCLOPramide  (REGLAN ) 5 MG tablet TAKE 1 TABLET BY MOUTH EVERY 8 HOURS AS NEEDED FOR NAUSEA OR FOR VOMITING 20 tablet 1   mirtazapine  (REMERON ) 15 MG tablet TAKE 1 TABLET BY MOUTH AT BEDTIME 90 tablet 3   naloxone (NARCAN) nasal spray 4 mg/0.1 mL Place 1 spray into the nose as needed (opioid overdose).     ONETOUCH ULTRA test strip USE UP TO FOUR TIMES DAILY AS DIRECTED (25 DAY supply) 100 strip 11   OZEMPIC , 2 MG/DOSE, 8 MG/3ML SOPN INJECT 2 MG UNDER THE SKIN WEEKLY 6 mL 2   simvastatin  (ZOCOR ) 40 MG tablet TAKE 1 TABLET BY MOUTH EVERY EVENING 100 tablet 3   triamcinolone  cream (KENALOG ) 0.1 % Apply 1 Application topically 2 (two) times daily. 30 g 0   No facility-administered medications prior to visit.    Allergies  Allergen Reactions   Codeine Other (See Comments)    Slurred Speech    Other     [place] - N/V    sweat potatoes - N/V  [place] - N/V   sweat potatoes - N/V   Penicillins Rash    Review of Systems  Constitutional:  Negative for fatigue and fever.  Eyes:  Negative for visual disturbance.  Respiratory:  Negative for chest tightness and shortness of breath.   Cardiovascular:  Negative for chest pain and palpitations.  Skin:  Positive for wound.  Neurological:  Negative for dizziness and headaches.       Objective:     Physical Exam  Skin:    Findings: Erythema present.     BP 133/72   Pulse 72   Resp 16   Ht 5' 8.5 (1.74 m)   Wt 230 lb 1.9 oz (104.4 kg)   SpO2 96%   BMI 34.48 kg/m  Wt Readings from Last 3 Encounters:  07/29/23 230 lb 1.9 oz (104.4 kg)  12/03/22 214 lb 0.6 oz (97.1 kg)  08/06/22 223 lb (101.2 kg)       Assessment & Plan:  Dog bite of right hand, initial encounter Assessment & Plan: The patient is advised to monitor the wound closely for signs of infection, including increased redness, swelling, pain, or discharge, and to seek medical attention if symptoms worsen. For  prophylactic treatment, the patient is to begin Bactrim  and Metronidazole  as prescribed. Additionally, the patient is instructed to stop by their local pharmacy to receive the Tdap (tetanus, diphtheria, and pertussis) vaccine    Orders: -     Sulfamethoxazole -Trimethoprim ; Take 1 tablet by mouth 2 (two) times daily for 5 days.  Dispense: 10 tablet; Refill: 0 -     metroNIDAZOLE ; Take 1 tablet (500 mg total) by mouth 3 (three) times daily for 5 days.  Dispense: 15 tablet; Refill: 0  Note: This chart has been completed using Engineer, civil (consulting) software, and while attempts have been made to ensure accuracy, certain words and phrases may not be transcribed as intended.    Cohan Stipes, FNP

## 2023-07-29 NOTE — Patient Instructions (Addendum)
 I appreciate the opportunity to provide care to you today!    Dog Bite -Monitor the wound for signs of infection, such as increased redness, swelling, pain, or discharge, and seek medical attention if symptoms worsen. -Start taking Bactrim and Metronidazole for prophylactic treatment as prescribed. -Please stop by your local pharmacy to receive your Tdap vaccine if not up to date.     Please continue to a heart-healthy diet and increase your physical activities. Try to exercise for at least five days a week.    It was a pleasure to see you and I look forward to continuing to work together on your health and well-being. Please do not hesitate to call the office if you need care or have questions about your care.  In case of emergency, please visit the Emergency Department for urgent care, or contact our clinic at [phone] to schedule an appointment. We're here to help you!   Have a wonderful day and week. With Gratitude, Hendrix Console MSN, FNP-BC

## 2023-07-29 NOTE — Assessment & Plan Note (Signed)
 The patient is advised to monitor the wound closely for signs of infection, including increased redness, swelling, pain, or discharge, and to seek medical attention if symptoms worsen. For prophylactic treatment, the patient is to begin Bactrim and Metronidazole as prescribed. Additionally, the patient is instructed to stop by their local pharmacy to receive the Tdap (tetanus, diphtheria, and pertussis) vaccine

## 2023-08-19 DIAGNOSIS — G894 Chronic pain syndrome: Secondary | ICD-10-CM | POA: Diagnosis not present

## 2023-08-19 DIAGNOSIS — M2550 Pain in unspecified joint: Secondary | ICD-10-CM | POA: Diagnosis not present

## 2023-08-19 DIAGNOSIS — M25559 Pain in unspecified hip: Secondary | ICD-10-CM | POA: Diagnosis not present

## 2023-08-19 DIAGNOSIS — Z79891 Long term (current) use of opiate analgesic: Secondary | ICD-10-CM | POA: Diagnosis not present

## 2023-08-19 DIAGNOSIS — M545 Low back pain, unspecified: Secondary | ICD-10-CM | POA: Diagnosis not present

## 2023-08-19 DIAGNOSIS — M542 Cervicalgia: Secondary | ICD-10-CM | POA: Diagnosis not present

## 2023-08-23 DIAGNOSIS — I10 Essential (primary) hypertension: Secondary | ICD-10-CM | POA: Diagnosis not present

## 2023-08-23 DIAGNOSIS — E1140 Type 2 diabetes mellitus with diabetic neuropathy, unspecified: Secondary | ICD-10-CM | POA: Diagnosis not present

## 2023-08-24 DIAGNOSIS — I10 Essential (primary) hypertension: Secondary | ICD-10-CM | POA: Diagnosis not present

## 2023-08-24 DIAGNOSIS — E1140 Type 2 diabetes mellitus with diabetic neuropathy, unspecified: Secondary | ICD-10-CM | POA: Diagnosis not present

## 2023-09-16 DIAGNOSIS — M545 Low back pain, unspecified: Secondary | ICD-10-CM | POA: Diagnosis not present

## 2023-09-16 DIAGNOSIS — G894 Chronic pain syndrome: Secondary | ICD-10-CM | POA: Diagnosis not present

## 2023-09-16 DIAGNOSIS — M2550 Pain in unspecified joint: Secondary | ICD-10-CM | POA: Diagnosis not present

## 2023-09-16 DIAGNOSIS — Z79891 Long term (current) use of opiate analgesic: Secondary | ICD-10-CM | POA: Diagnosis not present

## 2023-09-16 DIAGNOSIS — M25559 Pain in unspecified hip: Secondary | ICD-10-CM | POA: Diagnosis not present

## 2023-09-18 DIAGNOSIS — Z79891 Long term (current) use of opiate analgesic: Secondary | ICD-10-CM | POA: Diagnosis not present

## 2023-09-18 DIAGNOSIS — G894 Chronic pain syndrome: Secondary | ICD-10-CM | POA: Diagnosis not present

## 2023-09-23 DIAGNOSIS — E1140 Type 2 diabetes mellitus with diabetic neuropathy, unspecified: Secondary | ICD-10-CM | POA: Diagnosis not present

## 2023-09-23 DIAGNOSIS — I10 Essential (primary) hypertension: Secondary | ICD-10-CM | POA: Diagnosis not present

## 2023-10-23 DIAGNOSIS — E1140 Type 2 diabetes mellitus with diabetic neuropathy, unspecified: Secondary | ICD-10-CM | POA: Diagnosis not present

## 2023-10-23 DIAGNOSIS — I10 Essential (primary) hypertension: Secondary | ICD-10-CM | POA: Diagnosis not present

## 2023-10-24 DIAGNOSIS — E1140 Type 2 diabetes mellitus with diabetic neuropathy, unspecified: Secondary | ICD-10-CM | POA: Diagnosis not present

## 2023-10-24 DIAGNOSIS — I10 Essential (primary) hypertension: Secondary | ICD-10-CM | POA: Diagnosis not present

## 2023-10-26 ENCOUNTER — Other Ambulatory Visit: Payer: Self-pay | Admitting: Internal Medicine

## 2023-10-26 DIAGNOSIS — E1165 Type 2 diabetes mellitus with hyperglycemia: Secondary | ICD-10-CM

## 2023-10-29 ENCOUNTER — Telehealth: Admitting: Family Medicine

## 2023-10-29 ENCOUNTER — Ambulatory Visit: Payer: Self-pay

## 2023-10-29 DIAGNOSIS — J40 Bronchitis, not specified as acute or chronic: Secondary | ICD-10-CM

## 2023-10-29 MED ORDER — BENZONATATE 200 MG PO CAPS: 200.0000 mg | ORAL_CAPSULE | Freq: Two times a day (BID) | ORAL | 0 refills | Status: AC | PRN

## 2023-10-29 MED ORDER — DOXYCYCLINE HYCLATE 100 MG PO TABS: 100.0000 mg | ORAL_TABLET | Freq: Two times a day (BID) | ORAL | 0 refills | Status: AC

## 2023-10-29 NOTE — Telephone Encounter (Signed)
 FYI Only or Action Required?: FYI only for provider.  Patient was last seen in primary care on 07/29/2023 by Zarwolo, Gloria, FNP.  Called Nurse Triage reporting Cough.  Symptoms began several days ago.  Interventions attempted: OTC medications: Cold medication.  Symptoms are: unchanged.  Triage Disposition: Home Care - pt wants to be seen and given meds   Patient/caregiver understands and will follow disposition?: No - Pt scheduled MyChart vv.                   Copied from CRM [id number]. Topic: Clinical - Red Word Triage >> Oct 29, 2023  8:18 AM Thomas Rollins wrote: Thomas Rollins that prompted transfer to Nurse Triage: Received call from patient states he is having increased coughing, chest congestion, coughing up thick yellow, white mucous.  Has taken over the counter medication Equate brand daytime/nighttime pills, has helped a little, but has been going on since Monday this week, 10/25/23. Reason for Disposition  Cough  Answer Assessment - Initial Assessment Questions 1. ONSET: When did the cough begin?      Monday 2. SEVERITY: How bad is the cough today?      Moderate 3. SPUTUM: Describe the color of your sputum (e.g., none, dry cough; clear, white, yellow, green)     green 4. HEMOPTYSIS: Are you coughing up any blood? If Yes, ask: How much? (e.g., flecks, streaks, tablespoons, etc.)     no 5. DIFFICULTY BREATHING: Are you having difficulty breathing? If Yes, ask: How bad is it? (e.g., mild, moderate, severe)      Mild  6. FEVER: Do you have a fever? If Yes, ask: What is your temperature, how was it measured, and when did it start?     no 7. CARDIAC HISTORY: Do you have any history of heart disease? (e.g., heart attack, congestive heart failure)      no 8. LUNG HISTORY: Do you have any history of lung disease?  (e.g., pulmonary embolus, asthma, emphysema)     no 10. OTHER SYMPTOMS: Do you have any other symptoms? (e.g., runny nose, wheezing, chest  pain)       no  Protocols used: Cough - Acute Productive-A-AH

## 2023-10-29 NOTE — Progress Notes (Signed)
 Virtual Visit Consent   [name], you are scheduled for a virtual visit with a [facility] provider today. Just as with appointments in the office, your consent must be obtained to participate. Your consent will be active for this visit and any virtual visit you may have with one of our providers in the next 365 days. If you have a MyChart account, a copy of this consent can be sent to you electronically.  As this is a virtual visit, video technology does not allow for your provider to perform a traditional examination. This may limit your provider's ability to fully assess your condition. If your provider identifies any concerns that need to be evaluated in person or the need to arrange testing (such as labs, EKG, etc.), we will make arrangements to do so. Although advances in technology are sophisticated, we cannot ensure that it will always work on either your end or our end. If the connection with a video visit is poor, the visit may have to be switched to a telephone visit. With either a video or telephone visit, we are not always able to ensure that we have a secure connection.  By engaging in this virtual visit, you consent to the provision of healthcare and authorize for your insurance to be billed (if applicable) for the services provided during this visit. Depending on your insurance coverage, you may receive a charge related to this service.  I need to obtain your verbal consent now. Are you willing to proceed with your visit today? K[place] F Hallums has provided verbal consent on 10/29/2023 for a virtual visit (video or telephone). Loa Lamp, FNP  Date: 10/29/2023 11:48 AM   Virtual Visit via Video Note   I, Loa Lamp, connected with  SHAH Rollins  ([id number], 10-12-1960) on 10/29/23 at 11:45 AM EDT by a video-enabled telemedicine application and verified that I am speaking with the correct person using two identifiers.  Location: Patient: Virtual Visit Location Patient:  Home Provider: Virtual Visit Location Provider: Home Office   I discussed the limitations of evaluation and management by telemedicine and the availability of in person appointments. The patient expressed understanding and agreed to proceed.    History of Present Illness: Thomas Rollins is a 63 y.o. who identifies as a male who was assigned male at birth, and is being seen today for cough and chest congestion for a week, started with sore throat. Taking dayquil and nyquil. No wheezing or sob. He wanted to go camping but is feeling worse. No fever. Coughing up green mucus. 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 03/27/2020   OSA on CPAP 01/31/2020   CPAP (continuous positive airway pressure) dependence 10/23/2019   Opioid contract exists 10/23/2019   Edentulous 10/23/2019   Insomnia, unspecified 10/23/2019   Body mass index (BMI) 37.0-37.9, adult 09/12/2019   Elevated blood-pressure reading, without diagnosis of hypertension 09/12/2019   Generalized anxiety disorder with panic attacks  04/12/2019   Vitamin D  deficiency 04/11/2019   Hypothyroidism 04/11/2019   Uncontrolled type 2 diabetes mellitus with hyperglycemia (HCC) 02/05/2017   Major depressive disorder, recurrent episode, moderate (HCC) 02/05/2017   Hyperlipidemia associated with type 2 diabetes mellitus (HCC) 02/05/2017   Essential hypertension 02/05/2017    Allergies:   Allergies  Allergen Reactions   Codeine Other (See Comments)    Slurred Speech    Other     [place] - N/V    sweat potatoes - N/V  [place] - N/V   sweat potatoes - N/V   Penicillins Rash   Medications:  Current Outpatient Medications:    blood glucose meter kit and supplies, Dispense based on patient and insurance preference. Use up to four times daily as directed. (FOR ICD-10 E10.9, E11.9)., Disp: 1 each, Rfl: 0   Buprenorphine HCl-Naloxone HCl 8-2 MG FILM, Place 8 mg of opioid under the tongue every 12 (twelve) hours., Disp: , Rfl:    cyclobenzaprine  (FLEXERIL ) 10 MG tablet, Take 1 tablet (10 mg total) by mouth 2 (two) times daily as needed., Disp: 30 tablet, Rfl: 2   Diclofenac  Sodium 3 % GEL, apply 1 gram TOPICALLY FOUR TIMES DAILY AS NEEDED FOR PAIN, Disp: 100 g, Rfl: 1   DULoxetine  (CYMBALTA ) 30 MG capsule, Take 1 capsule (30 mg total) by mouth 3 (three) times daily., Disp: 270 capsule, Rfl: 0   gabapentin  (NEURONTIN ) 300 MG capsule, TAKE ONE CAPSULE BY MOUTH TWICE DAILY (ONLY taking one daily), Disp: 60 capsule, Rfl: 6   GLOBAL EASE INJECT PEN NEEDLES 31G X 8 MM MISC, Use to inject insulin  once daily, Disp: 100 each, Rfl: 3   Glucagon  (GVOKE HYPOPEN  2-PACK) 1 MG/0.2ML SOAJ, Inject 1 mg into the skin daily as needed., Disp: 0.4 mL, Rfl: 0   levothyroxine  (SYNTHROID ) 100 MCG tablet, Take 1 tablet (100 mcg total) by mouth daily., Disp: 90 tablet, Rfl: 0   lisinopril  (ZESTRIL ) 10 MG tablet, TAKE 1 TABLET BY MOUTH DAILY, Disp: 90 tablet, Rfl: 3   meloxicam  (MOBIC ) 7.5 MG tablet, TAKE 1 TABLET BY MOUTH DAILY, Disp: 90 tablet, Rfl: 3   metFORMIN  (GLUCOPHAGE ) 1000 MG tablet, TAKE 1 TABLET BY MOUTH TWICE DAILY, Disp: 180 tablet, Rfl: 3   metoCLOPramide  (REGLAN ) 5 MG tablet, TAKE 1 TABLET BY MOUTH EVERY 8 HOURS AS NEEDED FOR NAUSEA OR FOR VOMITING, Disp: 20 tablet, Rfl: 1   mirtazapine  (REMERON ) 15 MG tablet, TAKE 1 TABLET BY MOUTH AT BEDTIME, Disp: 90 tablet, Rfl: 3   naloxone (NARCAN)  nasal spray 4 mg/0.1 mL, Place 1 spray into the nose as needed (opioid overdose)., Disp: , Rfl:    ONETOUCH ULTRA test strip, USE UP TO FOUR TIMES DAILY AS DIRECTED (25 DAY supply), Disp: 100 strip, Rfl: 11   OZEMPIC , 2 MG/DOSE, 8 MG/3ML SOPN, INJECT 2 MG UNDER THE SKIN WEEKLY, Disp: 6 mL, Rfl: 2   simvastatin  (ZOCOR ) 40 MG tablet, TAKE 1 TABLET BY MOUTH EVERY EVENING, Disp: 100 tablet, Rfl: 3   triamcinolone  cream (KENALOG ) 0.1 %, Apply 1 Application topically 2 (two) times daily., Disp: 30 g, Rfl: 0  Observations/Objective: Patient is well-developed, well-nourished in no acute distress.  Resting comfortably  at home.  Head is normocephalic, atraumatic.  No labored breathing.  Speech is clear and coherent with logical content.  Patient is alert and oriented at baseline.    Assessment and Plan: 1. Bronchitis (Primary)  Increase fluids, humidifier, UC as needed.   Follow Up Instructions: I discussed the  assessment and treatment plan with the patient. The patient was provided an opportunity to ask questions and all were answered. The patient agreed with the plan and demonstrated an understanding of the instructions.  A copy of instructions were sent to the patient via MyChart unless otherwise noted below.     The patient was advised to call back or seek an in-person evaluation if the symptoms worsen or if the condition fails to improve as anticipated.    Selim Durden, FNP

## 2023-10-29 NOTE — Patient Instructions (Signed)

## 2023-10-29 NOTE — Telephone Encounter (Signed)
 Appt made.

## 2023-11-15 ENCOUNTER — Ambulatory Visit: Payer: Self-pay

## 2023-11-15 NOTE — Telephone Encounter (Signed)
 PATIENT SCHEDULED

## 2023-11-15 NOTE — Telephone Encounter (Signed)
 FYI Only or Action Required?: Action required by provider: referral request.  Patient was last seen in primary care on 10/29/2023 by Blair, Diane W, FNP.  Called Nurse Triage reporting Hip Pain.  Symptoms began about a month ago.  Interventions attempted: Rest, hydration, or home remedies.  Symptoms are: unchanged.  Triage Disposition: See PCP When Office is Open (Within 3 Days)  Patient/caregiver understands and will follow disposition?: Yes  Copied from CRM #[id number]. Topic: Referral - Question >> Nov 15, 2023 11:20 AM Cleave MATSU wrote: Reason for CRM: pt wants a referral to [facility] are we able to do this if so follow up with lauren at [facility] [phone]. Reason for Disposition  [1] MODERATE pain (e.g., interferes with normal activities, limping) AND [2] present > 3 days  Answer Assessment - Initial Assessment Questions Would like referral to [facility] for hip pain, specifically pain management Patient under the care of another pain management facility, but he would like to use a different one  1. LOCATION and RADIATION: Where is the pain located? Does the pain spread (shoot) anywhere else?     Left hip pain  3. SEVERITY: How bad is the pain? What does it keep you from doing?   (Scale 1-10; or mild, moderate, severe)     severe 4. ONSET: When did the pain start? Does it come and go, or is it there all the time?     Fall about a month ago 5. WORK OR EXERCISE: Has there been any recent work or exercise that involved this part of the body?      none 6. CAUSE: What do you think is causing the hip pain?      Fall one month ago 7. AGGRAVATING FACTORS: What makes the hip pain worse? (e.g., walking, climbing stairs, running)     activity 8. OTHER SYMPTOMS: Do you have any other symptoms? (e.g., back pain, pain shooting down leg,  fever, rash)     N/a  Patient states that he may be developing a pressure ulcer.  He states that the skin is just red,  not broken. Encouraged to keep area clean and dry and to change position frequently. Suggested a face to face appointment, but patient declined  Protocols used: Hip Pain-A-AH

## 2023-11-19 ENCOUNTER — Telehealth: Admitting: Family Medicine

## 2023-11-19 ENCOUNTER — Ambulatory Visit (HOSPITAL_COMMUNITY)
Admission: RE | Admit: 2023-11-19 | Discharge: 2023-11-19 | Disposition: A | Discharge status: Home / Self Care | Source: Ambulatory Visit | Attending: Family Medicine | Admitting: Family Medicine

## 2023-11-19 DIAGNOSIS — M47817 Spondylosis without myelopathy or radiculopathy, lumbosacral region: Secondary | ICD-10-CM | POA: Diagnosis not present

## 2023-11-19 DIAGNOSIS — M545 Low back pain, unspecified: Secondary | ICD-10-CM | POA: Diagnosis not present

## 2023-11-19 DIAGNOSIS — M25552 Pain in left hip: Secondary | ICD-10-CM

## 2023-11-19 DIAGNOSIS — M47812 Spondylosis without myelopathy or radiculopathy, cervical region: Secondary | ICD-10-CM

## 2023-11-19 NOTE — Progress Notes (Signed)
 Virtual Visit via Video Note  I connected with Thomas Rollins on 11/19/23 at 10:40 AM EDT by a video enabled telemedicine application and verified that I am speaking with the correct person using two identifiers.  Patient Location: Home Provider Location: Home Office  I discussed the limitations, risks, security, and privacy concerns of performing an evaluation and management service by video and the availability of in person appointments. I also discussed with the patient that there may be a patient responsible charge related to this service. The patient expressed understanding and agreed to proceed.  Subjective: PCP: No primary care provider on file.  No chief complaint on file.  HPI Lumbar and Left Hip Pain  This is a chronic problem that occurs constantly and has been gradually worsening since onset. The pain is located in the lumbar spine and radiates to the left hip. It is described as stabbing in quality, rated 8/10 in severity, and remains constant throughout the day. The pain is aggravated by changes in position, standing, and sitting. Stiffness is present all day. Associated symptoms include leg pain, numbness, and tingling. The patient has a history of arthritis , which is a contributing risk factor. He has tried gabapentin   and muscle relaxer for symptom relief, which provided only mild benefit  ROS: Per HPI  Current Outpatient Medications:    benzonatate  (TESSALON ) 200 MG capsule, Take 1 capsule (200 mg total) by mouth 2 (two) times daily as needed for cough., Disp: 20 capsule, Rfl: 0   blood glucose meter kit and supplies, Dispense based on patient and insurance preference. Use up to four times daily as directed. (FOR ICD-10 E10.9, E11.9)., Disp: 1 each, Rfl: 0   Buprenorphine HCl-Naloxone HCl 8-2 MG FILM, Place 8 mg of opioid under the tongue every 12 (twelve) hours., Disp: , Rfl:    cyclobenzaprine  (FLEXERIL ) 10 MG tablet, Take 1 tablet (10 mg total) by mouth 2 (two)  times daily as needed., Disp: 30 tablet, Rfl: 2   Diclofenac  Sodium 3 % GEL, apply 1 gram TOPICALLY FOUR TIMES DAILY AS NEEDED FOR PAIN, Disp: 100 g, Rfl: 1   DULoxetine  (CYMBALTA ) 30 MG capsule, Take 1 capsule (30 mg total) by mouth 3 (three) times daily., Disp: 270 capsule, Rfl: 0   gabapentin  (NEURONTIN ) 300 MG capsule, TAKE ONE CAPSULE BY MOUTH TWICE DAILY (ONLY taking one daily), Disp: 60 capsule, Rfl: 6   GLOBAL EASE INJECT PEN NEEDLES 31G X 8 MM MISC, Use to inject insulin  once daily, Disp: 100 each, Rfl: 3   Glucagon  (GVOKE HYPOPEN  2-PACK) 1 MG/0.2ML SOAJ, Inject 1 mg into the skin daily as needed., Disp: 0.4 mL, Rfl: 0   levothyroxine  (SYNTHROID ) 100 MCG tablet, Take 1 tablet (100 mcg total) by mouth daily., Disp: 90 tablet, Rfl: 0   lisinopril  (ZESTRIL ) 10 MG tablet, TAKE 1 TABLET BY MOUTH DAILY, Disp: 90 tablet, Rfl: 3   meloxicam  (MOBIC ) 7.5 MG tablet, TAKE 1 TABLET BY MOUTH DAILY, Disp: 90 tablet, Rfl: 3   metFORMIN  (GLUCOPHAGE ) 1000 MG tablet, TAKE 1 TABLET BY MOUTH TWICE DAILY, Disp: 180 tablet, Rfl: 3   metoCLOPramide  (REGLAN ) 5 MG tablet, TAKE 1 TABLET BY MOUTH EVERY 8 HOURS AS NEEDED FOR NAUSEA OR FOR VOMITING, Disp: 20 tablet, Rfl: 1   mirtazapine  (REMERON ) 15 MG tablet, TAKE 1 TABLET BY MOUTH AT BEDTIME, Disp: 90 tablet, Rfl: 3   naloxone (NARCAN) nasal spray 4 mg/0.1 mL, Place 1 spray into the nose as needed (opioid overdose)., Disp: ,  Rfl:    ONETOUCH ULTRA test strip, USE UP TO FOUR TIMES DAILY AS DIRECTED (25 DAY supply), Disp: 100 strip, Rfl: 11   OZEMPIC , 2 MG/DOSE, 8 MG/3ML SOPN, INJECT 2 MG UNDER THE SKIN WEEKLY, Disp: 6 mL, Rfl: 2   simvastatin  (ZOCOR ) 40 MG tablet, TAKE 1 TABLET BY MOUTH EVERY EVENING, Disp: 100 tablet, Rfl: 3   triamcinolone  cream (KENALOG ) 0.1 %, Apply 1 Application topically 2 (two) times daily., Disp: 30 g, Rfl: 0  Observations/Objective: There were no vitals filed for this visit. Physical Exam Patient is alert and no acute distress noted.    Assessment and Plan: Cervical spondylosis -     DG Lumbar Spine Complete; Future -     Ambulatory referral to Orthopedics  Lumbar pain -     DG Lumbar Spine Complete; Future  Lumbar back pain Assessment & Plan: Xray ordered  Lumbar pain with Left hip pain Discussed to focus on maintaining good posture, using lumbar support while sitting, and avoiding prolonged sitting or heavy lifting. Engage in low-impact exercises like walking or swimming to strengthen core muscles and reduce strain on the spine. Apply heat or ice packs as needed for pain relief and consider physical therapy for targeted exercises.      Follow Up Instructions: No follow-ups on file.   I discussed the assessment and treatment plan with the patient. The patient was provided an opportunity to ask questions, and all were answered. The patient agreed with the plan and demonstrated an understanding of the instructions.   The patient was advised to call back or seek an in-person evaluation if the symptoms worsen or if the condition fails to improve as anticipated.  The above assessment and management plan was discussed with the patient. The patient verbalized understanding of and has agreed to the management plan.   [name], FNP

## 2023-11-19 NOTE — Assessment & Plan Note (Addendum)
 Xray ordered  Lumbar pain with Left hip pain Discussed to focus on maintaining good posture, using lumbar support while sitting, and avoiding prolonged sitting or heavy lifting. Engage in low-impact exercises like walking or swimming to strengthen core muscles and reduce strain on the spine. Apply heat or ice packs as needed for pain relief and consider physical therapy for targeted exercises.

## 2023-11-22 DIAGNOSIS — I10 Essential (primary) hypertension: Secondary | ICD-10-CM | POA: Diagnosis not present

## 2023-11-22 DIAGNOSIS — E1140 Type 2 diabetes mellitus with diabetic neuropathy, unspecified: Secondary | ICD-10-CM | POA: Diagnosis not present

## 2023-11-23 DIAGNOSIS — E1140 Type 2 diabetes mellitus with diabetic neuropathy, unspecified: Secondary | ICD-10-CM | POA: Diagnosis not present

## 2023-11-23 DIAGNOSIS — I10 Essential (primary) hypertension: Secondary | ICD-10-CM | POA: Diagnosis not present

## 2023-11-24 ENCOUNTER — Telehealth: Payer: Self-pay | Admitting: Family Medicine

## 2023-11-24 NOTE — Telephone Encounter (Signed)
 Spoke with patient to schedule AWV and he asked for this medication to be refilled and sent to [facility]  mirtazapine  (REMERON ) 15 MG tablet   Thank you,  Corean,  AMB Clinical Support [facility] AWV Program Direct Dial ??[phone]

## 2023-11-25 ENCOUNTER — Other Ambulatory Visit: Payer: Self-pay

## 2023-11-25 DIAGNOSIS — G4700 Insomnia, unspecified: Secondary | ICD-10-CM

## 2023-11-25 DIAGNOSIS — F410 Panic disorder [episodic paroxysmal anxiety] without agoraphobia: Secondary | ICD-10-CM

## 2023-11-25 DIAGNOSIS — F4310 Post-traumatic stress disorder, unspecified: Secondary | ICD-10-CM

## 2023-11-25 DIAGNOSIS — F331 Major depressive disorder, recurrent, moderate: Secondary | ICD-10-CM

## 2023-11-25 MED ORDER — MIRTAZAPINE 15 MG PO TABS
15.0000 mg | ORAL_TABLET | Freq: Every day | ORAL | 1 refills | Status: AC
Start: 2023-11-25 — End: ?

## 2023-11-25 NOTE — Telephone Encounter (Signed)
 Med has been refilled to [facility]

## 2023-11-26 ENCOUNTER — Telehealth: Admitting: Family Medicine

## 2023-11-26 ENCOUNTER — Ambulatory Visit: Payer: Self-pay

## 2023-11-26 DIAGNOSIS — J40 Bronchitis, not specified as acute or chronic: Secondary | ICD-10-CM

## 2023-11-26 DIAGNOSIS — M542 Cervicalgia: Secondary | ICD-10-CM | POA: Diagnosis not present

## 2023-11-26 DIAGNOSIS — M25559 Pain in unspecified hip: Secondary | ICD-10-CM | POA: Diagnosis not present

## 2023-11-26 DIAGNOSIS — G894 Chronic pain syndrome: Secondary | ICD-10-CM | POA: Diagnosis not present

## 2023-11-26 DIAGNOSIS — M2550 Pain in unspecified joint: Secondary | ICD-10-CM | POA: Diagnosis not present

## 2023-11-26 DIAGNOSIS — Z79891 Long term (current) use of opiate analgesic: Secondary | ICD-10-CM | POA: Diagnosis not present

## 2023-11-26 DIAGNOSIS — M545 Low back pain, unspecified: Secondary | ICD-10-CM | POA: Diagnosis not present

## 2023-11-26 MED ORDER — AZITHROMYCIN 250 MG PO TABS: ORAL_TABLET | ORAL | 0 refills | Status: DC

## 2023-11-26 MED ORDER — PROMETHAZINE-DM 6.25-15 MG/5ML PO SYRP: 5.0000 mL | ORAL_SOLUTION | Freq: Four times a day (QID) | ORAL | 0 refills | Status: AC | PRN

## 2023-11-26 NOTE — Telephone Encounter (Signed)
 FYI Only or Action Required?: Action required by provider: request for appointment.  Patient was last seen in primary care on 11/19/2023 by Terry Wilhelmena Lloyd Hilario, FNP.  Called Nurse Triage reporting Cough.  Symptoms began a week ago.  Interventions attempted: Nothing.  Symptoms are: gradually worsening.  Triage Disposition: See Physician Within 24 Hours  Patient/caregiver understands and will follow disposition?:     Copied from CRM #[id number]. Topic: Clinical - Red Word Triage >> Nov 26, 2023  8:48 AM Ivette P wrote: Red Word that prompted transfer to Nurse Triage: cough up more mucus, white and greengoopy stuff. in throat and in chest. Reason for Disposition  SEVERE coughing spells (e.g., whooping sound after coughing, vomiting after coughing)  Answer Assessment - Initial Assessment Questions 1. ONSET: When did the cough begin?      1 week 2. SEVERITY: How bad is the cough today?      severe 3. SPUTUM: Describe the color of your sputum (e.g., none, dry cough; clear, white, yellow, green)     green 4. HEMOPTYSIS: Are you coughing up any blood? If Yes, ask: How much? (e.g., flecks, streaks, tablespoons, etc.)     no 5. DIFFICULTY BREATHING: Are you having difficulty breathing? If Yes, ask: How bad is it? (e.g., mild, moderate, severe)      mild 6. FEVER: Do you have a fever? If Yes, ask: What is your temperature, how was it measured, and when did it start?     no 7. CARDIAC HISTORY: Do you have any history of heart disease? (e.g., heart attack, congestive heart failure)      no 8. LUNG HISTORY: Do you have any history of lung disease?  (e.g., pulmonary embolus, asthma, emphysema)     no 9. PE RISK FACTORS: Do you have a history of blood clots? (or: recent major surgery, recent prolonged travel, bedridden)     no 10. OTHER SYMPTOMS: Do you have any other symptoms? (e.g., runny nose, wheezing, chest pain)       Mild wheezing 11. PREGNANCY: Is there  any chance you are pregnant? When was your last menstrual period?       N/a 12. TRAVEL: Have you traveled out of the country in the last month? (e.g., travel history, exposures)       no  Protocols used: Cough - Acute Productive-A-AH

## 2023-11-26 NOTE — Patient Instructions (Signed)

## 2023-11-26 NOTE — Telephone Encounter (Signed)
 FYI Only or Action Required?: FYI only for provider.  Patient was last seen in primary care on 11/19/2023 by Terry Wilhelmena Lloyd Hilario, FNP.  Called Nurse Triage reporting Cough.  Symptoms began yesterday.  Interventions attempted: Nothing.  Symptoms are: stable.  Triage Disposition: Information or Advice Only Call  Patient/caregiver understands and will follow disposition?: Yes Reason for Disposition  Health information question, no triage required and triager able to answer question  Answer Assessment - Initial Assessment Questions Patient does not wish to wait over the weekend for appointment, thinks symptoms will get worse. Patient requested virtual appointment today. Canceled Monday appointment and scheduled virtual telehealth for patient today.  1. REASON FOR CALL: What is the main reason for your call? or How can I best help you?     Patient spoke to NT earlier, scheduled Monday appointment, wants sooner appointment.  2. SYMPTOMS : Do you have any symptoms?      Coughing up green phlegm. Denies chest pain, fever, SOB.  Protocols used: Information Only Call - No Triage-A-AH  Copied from CRM [id number]. Topic: Clinical - Red Word Triage >> Nov 26, 2023  9:57 AM Jasmin G wrote: [facility] that prompted transfer to Nurse Triage: Pt initially called to check availability for an appt today due to experiencing major chest congestion, coughing and spitting up white green stuff, symptoms started back again yesterday morning.

## 2023-11-26 NOTE — Telephone Encounter (Signed)
Noted, appointment made

## 2023-11-26 NOTE — Progress Notes (Signed)
 Virtual Visit Consent   Thomas Rollins, you are scheduled for a virtual visit with a [facility] provider today. Just as with appointments in the office, your consent must be obtained to participate. Your consent will be active for this visit and any virtual visit you may have with one of our providers in the next 365 days. If you have a MyChart account, a copy of this consent can be sent to you electronically.  As this is a virtual visit, video technology does not allow for your provider to perform a traditional examination. This may limit your provider's ability to fully assess your condition. If your provider identifies any concerns that need to be evaluated in person or the need to arrange testing (such as labs, EKG, etc.), we will make arrangements to do so. Although advances in technology are sophisticated, we cannot ensure that it will always work on either your end or our end. If the connection with a video visit is poor, the visit may have to be switched to a telephone visit. With either a video or telephone visit, we are not always able to ensure that we have a secure connection.  By engaging in this virtual visit, you consent to the provision of healthcare and authorize for your insurance to be billed (if applicable) for the services provided during this visit. Depending on your insurance coverage, you may receive a charge related to this service.  I need to obtain your verbal consent now. Are you willing to proceed with your visit today? Thomas Rollins has provided verbal consent on 11/26/2023 for a virtual visit (video or telephone). Loa Lamp, FNP  Date: 11/26/2023 11:34 AM   Virtual Visit via Video Note   I, Loa Lamp, connected with  Thomas Rollins  ([id number], Jan 24, 2001) on 11/26/23 at 11:30 AM EDT by a video-enabled telemedicine application and verified that I am speaking with the correct person using two identifiers.  Location: Patient: Virtual Visit Location Patient:  Home Provider: Virtual Visit Location Provider: Home   I discussed the limitations of evaluation and management by telemedicine and the availability of in person appointments. The patient expressed understanding and agreed to proceed.    History of Present Illness: Thomas Rollins is a 63 y.o. who identifies as a male who was assigned male at birth, and is being seen today for cough. It did not resolve and now the cough is worse. No fever. No wheezing or sob. He has apptmt with pcp Monday. Mucus is thick green. He is afraid if he waits until apptmt Monday he will worsen. He heats home with wood.   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 03/27/2020   OSA on CPAP 01/31/2020   CPAP (continuous positive airway pressure) dependence 10/23/2019   Opioid contract exists 10/23/2019   Edentulous 10/23/2019   Insomnia, unspecified 10/23/2019   Body mass index (BMI) 37.0-37.9, adult 09/12/2019   Elevated  blood-pressure reading, without diagnosis of hypertension 09/12/2019   Generalized anxiety disorder with panic attacks 04/12/2019   Vitamin D  deficiency 04/11/2019   Hypothyroidism 04/11/2019   Uncontrolled type 2 diabetes mellitus with hyperglycemia (HCC) 02/05/2017   Major depressive disorder, recurrent episode, moderate (HCC) 02/05/2017   Hyperlipidemia associated with type 2 diabetes mellitus (HCC) 02/05/2017    Essential hypertension 02/05/2017    Allergies:  Allergies  Allergen Reactions   Codeine Other (See Comments)    Slurred Speech    Other     [place] - N/V    sweat potatoes - N/V  [place] - N/V   sweat potatoes - N/V   Penicillins Rash   Medications:  Current Outpatient Medications:    azithromycin  (ZITHROMAX ) 250 MG tablet, Take 2 tablets on day 1, then 1 tablet daily on days 2 through 5, Disp: 6 tablet, Rfl: 0   promethazine-dextromethorphan (PROMETHAZINE-DM) 6.25-15 MG/5ML syrup, Take 5 mLs by mouth 4 (four) times daily as needed for up to 10 days for cough., Disp: 118 mL, Rfl: 0   benzonatate  (TESSALON ) 200 MG capsule, Take 1 capsule (200 mg total) by mouth 2 (two) times daily as needed for cough., Disp: 20 capsule, Rfl: 0   blood glucose meter kit and supplies, Dispense based on patient and insurance preference. Use up to four times daily as directed. (FOR ICD-10 E10.9, E11.9)., Disp: 1 each, Rfl: 0   Buprenorphine HCl-Naloxone HCl 8-2 MG FILM, Place 8 mg of opioid under the tongue every 12 (twelve) hours., Disp: , Rfl:    cyclobenzaprine  (FLEXERIL ) 10 MG tablet, Take 1 tablet (10 mg total) by mouth 2 (two) times daily as needed., Disp: 30 tablet, Rfl: 2   Diclofenac  Sodium 3 % GEL, apply 1 gram TOPICALLY FOUR TIMES DAILY AS NEEDED FOR PAIN, Disp: 100 g, Rfl: 1   DULoxetine  (CYMBALTA ) 30 MG capsule, Take 1 capsule (30 mg total) by mouth 3 (three) times daily., Disp: 270 capsule, Rfl: 0   gabapentin  (NEURONTIN ) 300 MG capsule, TAKE ONE CAPSULE BY MOUTH TWICE DAILY (ONLY taking one daily), Disp: 60 capsule, Rfl: 6   GLOBAL EASE INJECT PEN NEEDLES 31G X 8 MM MISC, Use to inject insulin  once daily, Disp: 100 each, Rfl: 3   Glucagon  (GVOKE HYPOPEN  2-PACK) 1 MG/0.2ML SOAJ, Inject 1 mg into the skin daily as needed., Disp: 0.4 mL, Rfl: 0   levothyroxine  (SYNTHROID ) 100 MCG tablet, Take 1 tablet (100 mcg total) by mouth daily., Disp: 90 tablet, Rfl: 0   lisinopril  (ZESTRIL ) 10 MG tablet, TAKE  1 TABLET BY MOUTH DAILY, Disp: 90 tablet, Rfl: 3   meloxicam  (MOBIC ) 7.5 MG tablet, TAKE 1 TABLET BY MOUTH DAILY, Disp: 90 tablet, Rfl: 3   metFORMIN  (GLUCOPHAGE ) 1000 MG tablet, TAKE 1 TABLET BY MOUTH TWICE DAILY, Disp: 180 tablet, Rfl: 3   metoCLOPramide  (REGLAN ) 5 MG tablet, TAKE 1 TABLET BY MOUTH EVERY 8 HOURS AS NEEDED FOR NAUSEA OR FOR VOMITING, Disp: 20 tablet, Rfl: 1   mirtazapine  (REMERON ) 15 MG tablet, Take 1 tablet (15 mg total) by mouth at bedtime., Disp: 90 tablet, Rfl: 1   naloxone (NARCAN) nasal spray 4 mg/0.1 mL, Place 1 spray into the nose as needed (opioid overdose)., Disp: , Rfl:    ONETOUCH ULTRA test strip, USE UP TO FOUR TIMES DAILY AS DIRECTED (25 DAY supply), Disp: 100 strip, Rfl: 11   OZEMPIC , 2 MG/DOSE, 8 MG/3ML SOPN, INJECT 2 MG UNDER THE SKIN WEEKLY, Disp: 6 mL, Rfl: 2  simvastatin  (ZOCOR ) 40 MG tablet, TAKE 1 TABLET BY MOUTH EVERY EVENING, Disp: 100 tablet, Rfl: 3   triamcinolone  cream (KENALOG ) 0.1 %, Apply 1 Application topically 2 (two) times daily., Disp: 30 g, Rfl: 0  Observations/Objective: Patient is well-developed, well-nourished in no acute distress.  Resting comfortably  at home.  Head is normocephalic, atraumatic.  No labored breathing.  Speech is clear and coherent with logical content.  Patient is alert and oriented at baseline.    Assessment and Plan: There are no diagnoses linked to this encounter. Increase fluids humidifier at night, tylenol, robitussin during the day and prometh dm at night. UC if sx worsen otherwise keep appmt Monday.   Follow Up Instructions: I discussed the assessment and treatment plan with the patient. The patient was provided an opportunity to ask questions and all were answered. The patient agreed with the plan and demonstrated an understanding of the instructions.  A copy of instructions were sent to the patient via MyChart unless otherwise noted below.     The patient was advised to call back or seek an in-person  evaluation if the symptoms worsen or if the condition fails to improve as anticipated.    Ebbie Cherry, FNP

## 2023-11-29 ENCOUNTER — Ambulatory Visit: Payer: Self-pay | Admitting: Family Medicine

## 2023-11-29 ENCOUNTER — Telehealth: Admitting: Family Medicine

## 2023-11-29 DIAGNOSIS — R058 Other specified cough: Secondary | ICD-10-CM | POA: Diagnosis not present

## 2023-11-29 NOTE — Progress Notes (Signed)
 Virtual Visit via Video Note  I connected with Thomas Rollins on 11/29/23 at  2:00 PM EDT by a video enabled telemedicine application and verified that I am speaking with the correct person using two identifiers.  Patient Location: Home Provider Location: Office/Clinic  I discussed the limitations, risks, security, and privacy concerns of performing an evaluation and management service by video and the availability of in person appointments. I also discussed with the patient that there may be a patient responsible charge related to this service. The patient expressed understanding and agreed to proceed.  Subjective: PCP: Terry Wilhelmena Lloyd Hilario, FNP  Chief Complaint  Patient presents with   Cough    Cough, was evaluated on 10/03 for bronchitis    HPI The patient was treated for bronchitis on 11/26/2023 with azithromycin  and promethazine-DM and reports having one day left of antibiotics. He continues to cough up green sputum but denies fever, shortness of breath, sore throat, headache, nasal congestion, or muscle aches.  ROS: Per HPI  Current Outpatient Medications:    azithromycin  (ZITHROMAX ) 250 MG tablet, Take 2 tablets on day 1, then 1 tablet daily on days 2 through 5, Disp: 6 tablet, Rfl: 0   benzonatate  (TESSALON ) 200 MG capsule, Take 1 capsule (200 mg total) by mouth 2 (two) times daily as needed for cough., Disp: 20 capsule, Rfl: 0   blood glucose meter kit and supplies, Dispense based on patient and insurance preference. Use up to four times daily as directed. (FOR ICD-10 E10.9, E11.9)., Disp: 1 each, Rfl: 0   Buprenorphine HCl-Naloxone HCl 8-2 MG FILM, Place 8 mg of opioid under the tongue every 12 (twelve) hours., Disp: , Rfl:    cyclobenzaprine  (FLEXERIL ) 10 MG tablet, Take 1 tablet (10 mg total) by mouth 2 (two) times daily as needed., Disp: 30 tablet, Rfl: 2   Diclofenac  Sodium 3 % GEL, apply 1 gram TOPICALLY FOUR TIMES DAILY AS NEEDED FOR PAIN, Disp: 100 g, Rfl: 1    DULoxetine  (CYMBALTA ) 30 MG capsule, Take 1 capsule (30 mg total) by mouth 3 (three) times daily., Disp: 270 capsule, Rfl: 0   gabapentin  (NEURONTIN ) 300 MG capsule, TAKE ONE CAPSULE BY MOUTH TWICE DAILY (ONLY taking one daily), Disp: 60 capsule, Rfl: 6   GLOBAL EASE INJECT PEN NEEDLES 31G X 8 MM MISC, Use to inject insulin  once daily, Disp: 100 each, Rfl: 3   Glucagon  (GVOKE HYPOPEN  2-PACK) 1 MG/0.2ML SOAJ, Inject 1 mg into the skin daily as needed., Disp: 0.4 mL, Rfl: 0   levothyroxine  (SYNTHROID ) 100 MCG tablet, Take 1 tablet (100 mcg total) by mouth daily., Disp: 90 tablet, Rfl: 0   lisinopril  (ZESTRIL ) 10 MG tablet, TAKE 1 TABLET BY MOUTH DAILY, Disp: 90 tablet, Rfl: 3   meloxicam  (MOBIC ) 7.5 MG tablet, TAKE 1 TABLET BY MOUTH DAILY, Disp: 90 tablet, Rfl: 3   metFORMIN  (GLUCOPHAGE ) 1000 MG tablet, TAKE 1 TABLET BY MOUTH TWICE DAILY, Disp: 180 tablet, Rfl: 3   metoCLOPramide  (REGLAN ) 5 MG tablet, TAKE 1 TABLET BY MOUTH EVERY 8 HOURS AS NEEDED FOR NAUSEA OR FOR VOMITING, Disp: 20 tablet, Rfl: 1   mirtazapine  (REMERON ) 15 MG tablet, Take 1 tablet (15 mg total) by mouth at bedtime., Disp: 90 tablet, Rfl: 1   naloxone (NARCAN) nasal spray 4 mg/0.1 mL, Place 1 spray into the nose as needed (opioid overdose)., Disp: , Rfl:    ONETOUCH ULTRA test strip, USE UP TO FOUR TIMES DAILY AS DIRECTED (25 DAY supply), Disp:  100 strip, Rfl: 11   OZEMPIC , 2 MG/DOSE, 8 MG/3ML SOPN, INJECT 2 MG UNDER THE SKIN WEEKLY, Disp: 6 mL, Rfl: 2   promethazine-dextromethorphan (PROMETHAZINE-DM) 6.25-15 MG/5ML syrup, Take 5 mLs by mouth 4 (four) times daily as needed for up to 10 days for cough., Disp: 118 mL, Rfl: 0   simvastatin  (ZOCOR ) 40 MG tablet, TAKE 1 TABLET BY MOUTH EVERY EVENING, Disp: 100 tablet, Rfl: 3   triamcinolone  cream (KENALOG ) 0.1 %, Apply 1 Application topically 2 (two) times daily., Disp: 30 g, Rfl: 0  Observations/Objective: There were no vitals filed for this visit. Physical Exam Patient is  well-developed, well-nourished in no acute distress.  Resting comfortably at home.  Head is normocephalic, atraumatic.  No labored breathing.  Speech is clear and coherent with logical content.  Patient is alert and oriented at baseline.   Assessment and Plan: Other cough -     DG Chest 2 View  Encouraged the patient to complete the full course of prescribed treatment. Recommended increasing hydration, getting adequate rest, and avoiding respiratory irritants. Will review the chest X-ray results and proceed accordingly after completion of antibiotics. Follow Up Instructions: No follow-ups on file.   I discussed the assessment and treatment plan with the patient. The patient was provided an opportunity to ask questions, and all were answered. The patient agreed with the plan and demonstrated an understanding of the instructions.   The patient was advised to call back or seek an in-person evaluation if the symptoms worsen or if the condition fails to improve as anticipated.  The above assessment and management plan was discussed with the patient. The patient verbalized understanding of and has agreed to the management plan.   Josmar Messimer  Z Bacchus, FNP

## 2023-11-30 ENCOUNTER — Telehealth: Payer: Self-pay

## 2023-11-30 DIAGNOSIS — G8929 Other chronic pain: Secondary | ICD-10-CM | POA: Diagnosis not present

## 2023-11-30 DIAGNOSIS — M1612 Unilateral primary osteoarthritis, left hip: Secondary | ICD-10-CM | POA: Diagnosis not present

## 2023-11-30 DIAGNOSIS — M25552 Pain in left hip: Secondary | ICD-10-CM | POA: Diagnosis not present

## 2023-11-30 NOTE — Telephone Encounter (Signed)
 Copied from CRM [id number]. Topic: Clinical - Request for Lab/Test Order >> Nov 30, 2023 10:18 AM Ivette P wrote: Reason for CRM: pt called in to see about getting A1c checked. Pt said no one has followed up and he did not know his primary retired.    Pls follow up with pt to schedule once lab order is in.   [phone]

## 2023-11-30 NOTE — Telephone Encounter (Signed)
 scheduled

## 2023-11-30 NOTE — Telephone Encounter (Signed)
 Checking with all 3 providers where to work patient in, no openings til February. Per patient needs A1c check for [facility]

## 2023-12-01 ENCOUNTER — Ambulatory Visit: Admitting: Internal Medicine

## 2023-12-01 ENCOUNTER — Ambulatory Visit (INDEPENDENT_AMBULATORY_CARE_PROVIDER_SITE_OTHER)

## 2023-12-01 VITALS — BP 137/77 | HR 72 | Ht 68.0 in | Wt 228.0 lb

## 2023-12-01 DIAGNOSIS — I10 Essential (primary) hypertension: Secondary | ICD-10-CM | POA: Diagnosis not present

## 2023-12-01 DIAGNOSIS — E785 Hyperlipidemia, unspecified: Secondary | ICD-10-CM | POA: Diagnosis not present

## 2023-12-01 DIAGNOSIS — E1165 Type 2 diabetes mellitus with hyperglycemia: Secondary | ICD-10-CM | POA: Diagnosis not present

## 2023-12-01 DIAGNOSIS — Z7984 Long term (current) use of oral hypoglycemic drugs: Secondary | ICD-10-CM

## 2023-12-01 DIAGNOSIS — S90212A Contusion of left great toe with damage to nail, initial encounter: Secondary | ICD-10-CM

## 2023-12-01 DIAGNOSIS — E1169 Type 2 diabetes mellitus with other specified complication: Secondary | ICD-10-CM

## 2023-12-01 NOTE — Progress Notes (Signed)
 Established Patient Office Visit  Subjective   Patient ID: Thomas Rollins, male    DOB: 1960-08-24  Age: 63 y.o. MRN: [id number]  Chief Complaint  Patient presents with   Medical Management of Chronic Issues    A1C check per pts [facility] provider    HPI Discussed the use of AI scribe software for clinical note transcription with the patient, who gave verbal consent to proceed.  History of Present Illness   Thomas Rollins is a 63 year old male with diabetes who presents for an A1c check and evaluation of chest congestion. He was referred by his orthopedic doctor for an A1c check.  Chest congestion and productive cough - Chest congestion with greenish-white sputum production for the past two weeks - Completed a course of antibiotics from urgent care with persistent symptoms - No use of Mucinex - Currently using a cough suppressant from urgent care  Left hip arthralgia - Left hip pain described as 'excruciating' - X-rays taken last Friday showed no fractures - Diagnosed with arthritis - Prescribed a new medication for arthritis (name unknown) - Advised to perform exercises - Persistent pain despite treatment  Traumatic injury to left great toe - Dropped a 50-pound trunk on the big toe - Significant bruising and likely loss of toenail - Cleans area with soap, water, and rubbing alcohol - No use of antibiotic ointment  Diabetes mellitus management - Takes metformin  twice daily and Ozempic  - No need for medication refills - Referred by orthopedic doctor for A1c check      Patient Active Problem List   Diagnosis Date Noted   Lumbar back pain 11/19/2023   Dog bite of hand 07/29/2023   Chronic hip pain, bilateral 08/12/2022   PTSD (post-traumatic stress disorder) 03/16/2022   History of alcohol use disorder in sustained remission 03/16/2022   Cannabis use disorder, in early remission 03/16/2022   Hallucinogenic mushrooms use, in sustained remission 03/16/2022    Cervical strain 03/05/2022   Annual physical exam 12/16/2021   Need for immunization against influenza 11/12/2021   Right hip pain 05/13/2021   Erectile dysfunction 08/01/2020   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 03/27/2020   OSA on CPAP 01/31/2020   CPAP (continuous positive airway pressure) dependence 10/23/2019   Opioid contract exists 10/23/2019   Edentulous 10/23/2019   Insomnia, unspecified 10/23/2019   Body mass index (BMI) 37.0-37.9, adult 09/12/2019   Elevated blood-pressure reading, without diagnosis of hypertension 09/12/2019   Generalized anxiety disorder with panic attacks 04/12/2019   Vitamin D  deficiency 04/11/2019   Hypothyroidism 04/11/2019   Uncontrolled type 2 diabetes mellitus with hyperglycemia (HCC) 02/05/2017   Major depressive disorder, recurrent episode, moderate (HCC) 02/05/2017   Hyperlipidemia associated with type 2 diabetes mellitus (HCC) 02/05/2017   Essential hypertension 02/05/2017    ROS    Objective:     BP 137/77   Pulse 72   Ht 5' 8 (1.727 m)   Wt 228 lb 0.6 oz (103.4 kg)   SpO2 95%   BMI 34.67 kg/m  BP Readings from Last 3 Encounters:  12/01/23 137/77  07/29/23 133/72  12/03/22 133/85   Wt Readings from Last 3 Encounters:  12/01/23 228 lb 0.6 oz (103.4 kg)  07/29/23 230 lb 1.9 oz (104.4 kg)  12/03/22 214 lb 0.6 oz (97.1 kg)     Physical Exam Vitals and nursing note reviewed.  Constitutional:      Appearance: Normal appearance.  HENT:     Head: Normocephalic.  Eyes:  Extraocular Movements: Extraocular movements intact.     Pupils: Pupils are equal, round, and reactive to light.  Cardiovascular:     Rate and Rhythm: Normal rate and regular rhythm.  Pulmonary:     Effort: Pulmonary effort is normal.     Breath sounds: Normal breath sounds.  Musculoskeletal:        General: Normal range of motion.     Cervical back: Normal range of motion and neck supple.  Feet:     Comments: Right great toe bruised and dried blood around  toenail Skin:    General: Skin is warm.  Neurological:     Mental Status: He is alert and oriented to person, place, and time.  Psychiatric:        Mood and Affect: Mood normal.        Thought Content: Thought content normal.     Last CBC Lab Results  Component Value Date   WBC 5.8 06/22/2022   HGB 13.0 06/22/2022   HCT 41.3 06/22/2022   MCV 88 06/22/2022   MCH 27.6 06/22/2022   RDW 14.3 06/22/2022   PLT 202 06/22/2022   Last metabolic panel Lab Results  Component Value Date   GLUCOSE 182 (H) 12/01/2023   NA 137 12/01/2023   K 4.8 12/01/2023   CL 96 12/01/2023   CO2 22 12/01/2023   BUN 26 12/01/2023   CREATININE 1.10 12/01/2023   EGFR 74 12/01/2023   CALCIUM 10.2 12/01/2023   PROT 7.2 12/01/2023   ALBUMIN 4.7 12/01/2023   LABGLOB 2.5 12/01/2023   AGRATIO 2.1 12/09/2021   BILITOT 0.3 12/01/2023   ALKPHOS 73 12/01/2023   AST 27 12/01/2023   ALT 20 12/01/2023   ANIONGAP 8 01/31/2021   Last lipids Lab Results  Component Value Date   CHOL 143 12/01/2023   HDL 41 12/01/2023   LDLCALC 70 12/01/2023   TRIG 189 (H) 12/01/2023   CHOLHDL 3.5 12/01/2023   Last hemoglobin A1c Lab Results  Component Value Date   HGBA1C 9.3 (H) 12/01/2023   Last thyroid  functions Lab Results  Component Value Date   TSH 0.915 12/09/2021   Last vitamin D  Lab Results  Component Value Date   VD25OH 38.8 03/18/2022    The 10-year ASCVD risk score (Arnett DK, et al., 2019) is: 26.5%    Assessment & Plan:   Problem List Items Addressed This Visit       Cardiovascular and Mediastinum   Essential hypertension   Stable with current medication regimen.  No refills needed at this time.        Relevant Orders   CMP14+EGFR (Completed)     Endocrine   Uncontrolled type 2 diabetes mellitus with hyperglycemia (HCC) - Primary   Ongoing management with metformin  and Ozempic . A1c overdue for testing. - Order A1c test. - Continue metformin  and Ozempic  as prescribed.       Relevant Orders   CMP14+EGFR (Completed)   HgB A1c (Completed)   Urine Microalbumin w/creat. ratio (Completed)   B12 and Folate Panel (Completed)   Hyperlipidemia associated with type 2 diabetes mellitus (HCC)   Relevant Orders   CMP14+EGFR (Completed)   Lipid Profile (Completed)   Other Visit Diagnoses       Contusion of left great toe with damage to nail, initial encounter       Significant trauma with bruising and potential toenail loss. Area cleaned regularly. - Recommend use of antibiotic ointment around the nail bed.      No  follow-ups on file.    Leita Longs, FNP

## 2023-12-03 LAB — CMP14+EGFR
ALT: 20 IU/L (ref 0–44)
AST: 27 IU/L (ref 0–40)
Albumin: 4.7 g/dL (ref 3.9–4.9)
Alkaline Phosphatase: 73 IU/L (ref 47–123)
BUN/Creatinine Ratio: 24 (ref 10–24)
BUN: 26 mg/dL (ref 8–27)
Bilirubin Total: 0.3 mg/dL (ref 0.0–1.2)
CO2: 22 mmol/L (ref 20–29)
Calcium: 10.2 mg/dL (ref 8.6–10.2)
Chloride: 96 mmol/L (ref 96–106)
Creatinine, Ser: 1.1 mg/dL (ref 0.76–1.27)
Globulin, Total: 2.5 g/dL (ref 1.5–4.5)
Glucose: 182 mg/dL — ABNORMAL HIGH (ref 70–99)
Potassium: 4.8 mmol/L (ref 3.5–5.2)
Sodium: 137 mmol/L (ref 134–144)
Total Protein: 7.2 g/dL (ref 6.0–8.5)
eGFR: 74 mL/min/1.73 (ref >59)

## 2023-12-03 LAB — MICROALBUMIN / CREATININE URINE RATIO
Creatinine, Urine: 115.6 mg/dL
Microalb/Creat Ratio: 4 mg/g{creat} (ref 0–29)
Microalbumin, Urine: 5 ug/mL

## 2023-12-03 LAB — LIPID PANEL
Chol/HDL Ratio: 3.5 ratio (ref 0.0–5.0)
Cholesterol, Total: 143 mg/dL (ref 100–199)
HDL: 41 mg/dL (ref >39)
LDL Chol Calc (NIH): 70 mg/dL (ref 0–99)
Triglycerides: 189 mg/dL — ABNORMAL HIGH (ref 0–149)
VLDL Cholesterol Cal: 32 mg/dL (ref 5–40)

## 2023-12-03 LAB — HEMOGLOBIN A1C
Est. average glucose Bld gHb Est-mCnc: 220 mg/dL
Hgb A1c MFr Bld: 9.3 % — ABNORMAL HIGH (ref 4.8–5.6)

## 2023-12-03 LAB — B12 AND FOLATE PANEL
Folate: 5.8 ng/mL (ref >3.0)
Vitamin B-12: 586 pg/mL (ref 232–1245)

## 2023-12-05 ENCOUNTER — Ambulatory Visit: Payer: Self-pay

## 2023-12-05 NOTE — Assessment & Plan Note (Signed)
 Ongoing management with metformin  and Ozempic . A1c overdue for testing. - Order A1c test. - Continue metformin  and Ozempic  as prescribed.

## 2023-12-05 NOTE — Assessment & Plan Note (Signed)
 Stable with current medication regimen.  No refills needed at this time.

## 2023-12-06 ENCOUNTER — Telehealth: Payer: Self-pay

## 2023-12-06 NOTE — Telephone Encounter (Signed)
 Copied from CRM [id number]. Topic: Clinical - Lab/Test Results >> Dec 06, 2023 12:11 PM Lonell PEDLAR wrote: Reason for CRM: Patient called requesting to know the results for his A1c labs [phone]

## 2023-12-07 ENCOUNTER — Telehealth: Payer: Self-pay

## 2023-12-07 DIAGNOSIS — M545 Low back pain, unspecified: Secondary | ICD-10-CM | POA: Diagnosis not present

## 2023-12-07 DIAGNOSIS — M47816 Spondylosis without myelopathy or radiculopathy, lumbar region: Secondary | ICD-10-CM | POA: Diagnosis not present

## 2023-12-07 NOTE — Telephone Encounter (Signed)
 Called pt to advise him that   His labs show improvement in A1C level, however it is still above goal of 7.  It is currently 9.3, but is down from previous A1C of 12.0.  continue with medications and recommend working on a low-carbohydrate diet and regular exercise to improve

## 2023-12-07 NOTE — Telephone Encounter (Signed)
 Attempt # left voicemail to call back  Reason for call documented in pts chart

## 2023-12-09 DIAGNOSIS — Z008 Encounter for other general examination: Secondary | ICD-10-CM | POA: Diagnosis not present

## 2023-12-14 ENCOUNTER — Ambulatory Visit

## 2023-12-21 ENCOUNTER — Other Ambulatory Visit: Payer: Self-pay | Admitting: Internal Medicine

## 2023-12-21 DIAGNOSIS — F331 Major depressive disorder, recurrent, moderate: Secondary | ICD-10-CM

## 2023-12-21 DIAGNOSIS — F410 Panic disorder [episodic paroxysmal anxiety] without agoraphobia: Secondary | ICD-10-CM

## 2023-12-21 DIAGNOSIS — M25551 Pain in right hip: Secondary | ICD-10-CM

## 2023-12-21 DIAGNOSIS — F4310 Post-traumatic stress disorder, unspecified: Secondary | ICD-10-CM

## 2023-12-22 DIAGNOSIS — I10 Essential (primary) hypertension: Secondary | ICD-10-CM | POA: Diagnosis not present

## 2023-12-22 DIAGNOSIS — E1140 Type 2 diabetes mellitus with diabetic neuropathy, unspecified: Secondary | ICD-10-CM | POA: Diagnosis not present

## 2023-12-24 DIAGNOSIS — M2550 Pain in unspecified joint: Secondary | ICD-10-CM | POA: Diagnosis not present

## 2023-12-24 DIAGNOSIS — Z79891 Long term (current) use of opiate analgesic: Secondary | ICD-10-CM | POA: Diagnosis not present

## 2023-12-24 DIAGNOSIS — I10 Essential (primary) hypertension: Secondary | ICD-10-CM | POA: Diagnosis not present

## 2023-12-24 DIAGNOSIS — E1140 Type 2 diabetes mellitus with diabetic neuropathy, unspecified: Secondary | ICD-10-CM | POA: Diagnosis not present

## 2023-12-24 DIAGNOSIS — G8929 Other chronic pain: Secondary | ICD-10-CM | POA: Diagnosis not present

## 2023-12-24 DIAGNOSIS — M25559 Pain in unspecified hip: Secondary | ICD-10-CM | POA: Diagnosis not present

## 2023-12-24 DIAGNOSIS — M545 Low back pain, unspecified: Secondary | ICD-10-CM | POA: Diagnosis not present

## 2023-12-24 DIAGNOSIS — G894 Chronic pain syndrome: Secondary | ICD-10-CM | POA: Diagnosis not present

## 2023-12-27 DIAGNOSIS — G8929 Other chronic pain: Secondary | ICD-10-CM | POA: Diagnosis not present

## 2023-12-27 DIAGNOSIS — M1612 Unilateral primary osteoarthritis, left hip: Secondary | ICD-10-CM | POA: Diagnosis not present

## 2024-01-04 ENCOUNTER — Ambulatory Visit (INDEPENDENT_AMBULATORY_CARE_PROVIDER_SITE_OTHER)

## 2024-01-04 VITALS — BP 118/70 | HR 76 | Resp 14 | Ht 68.0 in | Wt 232.1 lb

## 2024-01-04 DIAGNOSIS — Z0000 Encounter for general adult medical examination without abnormal findings: Secondary | ICD-10-CM

## 2024-01-04 DIAGNOSIS — Z23 Encounter for immunization: Secondary | ICD-10-CM | POA: Diagnosis not present

## 2024-01-04 NOTE — Progress Notes (Signed)
 Subjective:   Thomas Rollins is a 63 y.o. male who presents for a Medicare Annual Wellness Visit.  Allergies (verified) Codeine, Other, and Penicillins   History: Past Medical History:  Diagnosis Date   Abnormal LFTs    Abnormal LFTs    Anxiety    panic attacks   Chronic pain    Depression    Diabetes mellitus without complication (HCC)    Encounter for hepatitis C screening test for low risk patient 04/05/2019   Encounter for screening colonoscopy    Hyperlipidemia    Hypertension    Hypothyroidism    Sleep apnea    TIA (transient ischemic attack) 2019   Past Surgical History:  Procedure Laterality Date   ankle sx     ARTHROSCOPIC REPAIR ACL     CARPAL TUNNEL RELEASE     COLONOSCOPY WITH PROPOFOL  N/A 02/03/2021   Procedure: COLONOSCOPY WITH PROPOFOL ;  Surgeon: Cindie Carlin POUR, DO;  Location: [facility];  Service: Endoscopy;  Laterality: N/A;  8:00am   HERNIA REPAIR     SHOULDER ARTHROSCOPY     Family History  Problem Relation Age of Onset   Stomach cancer Mother 37   Heart Problems Father    Dementia Father    Cancer Father        tumors   Bone cancer Brother    Lung cancer Brother    Cancer Daughter 76       Male cancer   Cancer Daughter 9       Male   Colon cancer Neg Hx    Social History   Occupational History   Not on file  Tobacco Use   Smoking status: Former    Current packs/day: 0.00    Average packs/day: 1 pack/day for 19.0 years (19.0 ttl pk-yrs)    Types: Cigarettes    Start date: 70    Quit date: 1994    Years since quitting: 31.8   Smokeless tobacco: Never  Vaping Use   Vaping status: Never Used  Substance and Sexual Activity   Alcohol use: Not Currently    Comment: Heavy use from ages [age] with 8 or 9 tonic based beverages daily.  Cut back significantly on adulthood with last drink Christmas 23 to avoid smoking marijuana   Drug use: Not Currently    Types: Marijuana, Mescaline, Psilocybin    Comment: Quit smoking  pot in 2023 having previously used since teenage years.  One-time use mescaline and one-time use of mushrooms in his youth   Sexual activity: Never   Tobacco Counseling Counseling given: Yes  SDOH Screenings   Food Insecurity: No Food Insecurity (01/04/2024)  Housing: Low Risk  (01/04/2024)  Transportation Needs: No Transportation Needs (01/04/2024)  Utilities: Not At Risk (01/04/2024)  Alcohol Screen: Low Risk  (08/05/2022)  Depression (PHQ2-9): Medium Risk (01/04/2024)  Financial Resource Strain: Low Risk  (08/05/2022)  Physical Activity: Inactive (01/04/2024)  Social Connections: Socially Isolated (01/04/2024)  Stress: No Stress Concern Present (01/04/2024)  Tobacco Use: Medium Risk (01/04/2024)  Health Literacy: Adequate Health Literacy (01/04/2024)   Depression Screen    01/04/2024    5:36 PM 12/01/2023    8:08 AM 07/29/2023   10:43 AM 12/03/2022    9:27 AM 08/06/2022    3:36 PM 08/05/2022    8:35 AM 06/24/2022    1:20 PM  PHQ 2/9 Scores  PHQ - 2 Score 1 0 0 0 0 0 3  PHQ- 9 Score 6 4   4  2  0  10      Data saved with a previous flowsheet row definition     Goals Addressed             This Visit's Progress    Set My Target A1C-Diabetes Type 2       Timeframe:  Long-Range Goal Priority:  High Start Date: 01/04/2024                             Expected End Date:                       Follow Up Date 01/05/2025    - set target A1C  Why is this important?   Your target A1C is decided together by you and your doctor.  It is based on several things like your age and other health issues.    Notes: Per patient, he would like to get his A1C down to between 6-7 within the next year       Visit info / Clinical Intake: Medicare Wellness Visit Type:: Subsequent Annual Wellness Visit Persons participating in visit:: patient Medicare Wellness Visit Mode:: In-person (required for WTM) Information given by:: patient Interpreter Needed?: No Pre-visit prep was completed:  yes AWV questionnaire completed by patient prior to visit?: no Living arrangements:: (!) lives alone Patient's Overall Health Status Rating: (!) fair Typical amount of pain: some Does pain affect daily life?: (!) yes Are you currently prescribed opioids?: (!) yes  Dietary Habits and Nutritional Risks How many meals a day?: 3 Eats fruit and vegetables daily?: yes Most meals are obtained by: preparing own meals In the last 2 weeks, have you had any of the following?: none Diabetic:: (!) yes Any non-healing wounds?: no How often do you check your BS?: continuous glucose monitor Would you like to be referred to a Nutritionist or for Diabetic Management? : yes  Functional Status Activities of Daily Living (to include ambulation/medication): Independent Ambulation: Independent Medication Administration: Independent Home Management: Independent Manage your own finances?: yes Primary transportation is: driving Concerns about vision?: no *vision screening is required for WTM* Concerns about hearing?: no  Fall Screening Falls in the past year?: 1 Number of falls in past year: 1 Was there an injury with Fall?: 1 Fall Risk Category Calculator: 3 Patient Fall Risk Level: High Fall Risk  Fall Risk Patient at Risk for Falls Due to: History of fall(s); Impaired balance/gait; Medication side effect Fall risk Follow up: Falls evaluation completed; Education provided; Falls prevention discussed  Home and Transportation Safety: All rugs have non-skid backing?: N/A, no rugs All stairs or steps have railings?: N/A, no stairs Grab bars in the bathtub or shower?: yes Have non-skid surface in bathtub or shower?: yes Good home lighting?: yes Regular seat belt use?: yes  Cognitive Assessment Difficulty concentrating, remembering, or making decisions? : no Will 6CIT or Mini Cog be Completed: no 6CIT or Mini Cog Declined: patient alert, oriented, able to answer questions appropriately and recall  recent events  Advance Directives (For Healthcare) Does Patient Have a Medical Advance Directive?: No Would patient like information on creating a medical advance directive?: No - Patient declined  Reviewed/Updated  Reviewed/Updated: Reviewed All (Medical, Surgical, Family, Medications, Allergies, Care Teams, Patient Goals)        Objective:    Today's Vitals   01/04/24 1329  BP: 118/70  Pulse: 76  Resp: 14  SpO2: 100%  Weight: 232 lb 1.9 oz (105.3 kg)  Height: 5' 8 (1.727 m)  PainSc: 6   PainLoc: Back   Body mass index is 35.29 kg/m.  Current Medications (verified) Outpatient Encounter Medications as of 01/04/2024  Medication Sig   benzonatate  (TESSALON ) 200 MG capsule Take 1 capsule (200 mg total) by mouth 2 (two) times daily as needed for cough.   blood glucose meter kit and supplies Dispense based on patient and insurance preference. Use up to four times daily as directed. (FOR ICD-10 E10.9, E11.9).   Buprenorphine HCl-Naloxone HCl 8-2 MG FILM Place 8 mg of opioid under the tongue every 12 (twelve) hours.   cyclobenzaprine  (FLEXERIL ) 10 MG tablet Take 1 tablet (10 mg total) by mouth 2 (two) times daily as needed.   Diclofenac  Sodium 3 % GEL apply 1 gram TOPICALLY FOUR TIMES DAILY AS NEEDED FOR PAIN   DULoxetine  (CYMBALTA ) 30 MG capsule TAKE ONE CAPSULE BY MOUTH THREE TIMES DAILY   gabapentin  (NEURONTIN ) 300 MG capsule TAKE ONE CAPSULE BY MOUTH TWICE DAILY (ONLY taking one daily)   GLOBAL EASE INJECT PEN NEEDLES 31G X 8 MM MISC Use to inject insulin  once daily   Glucagon  (GVOKE HYPOPEN  2-PACK) 1 MG/0.2ML SOAJ Inject 1 mg into the skin daily as needed.   levothyroxine  (SYNTHROID ) 100 MCG tablet Take 1 tablet (100 mcg total) by mouth daily.   lisinopril  (ZESTRIL ) 10 MG tablet TAKE 1 TABLET BY MOUTH DAILY   meloxicam  (MOBIC ) 15 MG tablet Take 15 mg by mouth daily.   metFORMIN  (GLUCOPHAGE ) 1000 MG tablet TAKE 1 TABLET BY MOUTH TWICE DAILY   metoCLOPramide  (REGLAN ) 5 MG  tablet TAKE 1 TABLET BY MOUTH EVERY 8 HOURS AS NEEDED FOR NAUSEA OR FOR VOMITING   mirtazapine  (REMERON ) 15 MG tablet Take 1 tablet (15 mg total) by mouth at bedtime.   naloxone (NARCAN) nasal spray 4 mg/0.1 mL Place 1 spray into the nose as needed (opioid overdose).   ONETOUCH ULTRA test strip USE UP TO FOUR TIMES DAILY AS DIRECTED (25 DAY supply)   OZEMPIC , 2 MG/DOSE, 8 MG/3ML SOPN INJECT 2 MG UNDER THE SKIN WEEKLY   simvastatin  (ZOCOR ) 40 MG tablet TAKE 1 TABLET BY MOUTH EVERY EVENING   triamcinolone  cream (KENALOG ) 0.1 % Apply 1 Application topically 2 (two) times daily.   [DISCONTINUED] meloxicam  (MOBIC ) 7.5 MG tablet TAKE 1 TABLET BY MOUTH DAILY   No facility-administered encounter medications on file as of 01/04/2024.   Hearing/Vision screen Hearing Screening - Comments:: Patient denies any hearing difficulties.   Vision Screening - Comments:: Wears rx glasses - up to date with routine eye exams with  Oneil Kawasaki w/ [facility] Immunizations and Health Maintenance Health Maintenance  Topic Date Due   DTaP/Tdap/Td (1 - Tdap) Never done   Pneumococcal Vaccine: 50+ Years (1 of 2 - PCV) Never done   Zoster Vaccines- Shingrix (1 of 2) Never done   OPHTHALMOLOGY EXAM  11/23/2019   FOOT EXAM  06/24/2023   Influenza Vaccine  09/24/2023   HEMOGLOBIN A1C  05/31/2024   Diabetic kidney evaluation - eGFR measurement  11/30/2024   Diabetic kidney evaluation - Urine ACR  11/30/2024   Medicare Annual Wellness (AWV)  01/03/2025   Colonoscopy  02/04/2031   Hepatitis C Screening  Completed   HIV Screening  Completed   Hepatitis B Vaccines 19-59 Average Risk  Aged Out   Meningococcal B Vaccine  Aged Out   COVID-19 Vaccine  Discontinued  Assessment/Plan:  This is a routine wellness examination for Thomas Rollins.  Patient Care Team: Del Wilhelmena Falter, Hilario, FNP as PCP - General (Family Medicine) Cindie Carlin POUR, DO as Consulting Physician (Gastroenterology) Jacinto Lonni PARAS, [facility]  (Inactive) (Pharmacist) Clide Boss, MD as Referring Physician (Pain Medicine) Darroll Anes, DO [facility])  I have personally reviewed and noted the following in the patient's chart:   Medical and social history Use of alcohol, tobacco or illicit drugs  Current medications and supplements including opioid prescriptions. Functional ability and status Nutritional status Physical activity Advanced directives List of other physicians Hospitalizations, surgeries, and ER visits in previous 12 months Vitals Screenings to include cognitive, depression, and falls Referrals and appointments  No orders of the defined types were placed in this encounter.  In addition, I have reviewed and discussed with patient certain preventive protocols, quality metrics, and best practice recommendations. A written personalized care plan for preventive services as well as general preventive health recommendations were provided to patient.   Beryle Zeitz, CMA   01/04/2024   No follow-ups on file.  After Visit Summary: (In Person-Declined) Patient declined AVS at this time.  Nurse Notes:

## 2024-01-04 NOTE — Patient Instructions (Signed)
 Thomas Rollins,  Thank you for taking the time for your Medicare Wellness Visit. I appreciate your continued commitment to your health goals. Please review the care plan we discussed, and feel free to reach out if I can assist you further.  Please note that Annual Wellness Visits do not include a physical exam. Some assessments may be limited, especially if the visit was conducted virtually. If needed, we may recommend an in-person follow-up with your provider.  Ongoing Care Seeing your primary care provider every 3 to 6 months helps us  monitor your health and provide consistent, personalized care.   Referrals If a referral was made during today's visit and you haven't received any updates within two weeks, please contact the referred provider directly to check on the status.  Recommended Screenings:  Health Maintenance  Topic Date Due   DTaP/Tdap/Td vaccine (1 - Tdap) Never done   Pneumococcal Vaccine for age over 63 (1 of 2 - PCV) Never done   Zoster (Shingles) Vaccine (1 of 2) Never done   Eye exam for diabetics  11/23/2019   Complete foot exam   06/24/2023   Flu Shot  09/24/2023   Hemoglobin A1C  05/31/2024   Yearly kidney function blood test for diabetes  11/30/2024   Yearly kidney health urinalysis for diabetes  11/30/2024   Medicare Annual Wellness Visit  01/03/2025   Colon Cancer Screening  02/04/2031   Hepatitis C Screening  Completed   HIV Screening  Completed   Hepatitis B Vaccine  Aged Out   Meningitis B Vaccine  Aged Out   COVID-19 Vaccine  Discontinued       01/04/2024    2:05 PM  Advanced Directives  Does Patient Have a Medical Advance Directive? No  Would patient like information on creating a medical advance directive? No - Patient declined    Vision: Annual vision screenings are recommended for early detection of glaucoma, cataracts, and diabetic retinopathy. These exams can also reveal signs of chronic conditions such as diabetes and high blood  pressure.  Dental: Annual dental screenings help detect early signs of oral cancer, gum disease, and other conditions linked to overall health, including heart disease and diabetes.  Please see the attached documents for additional preventive care recommendations.

## 2024-01-05 ENCOUNTER — Ambulatory Visit: Payer: Self-pay

## 2024-01-05 NOTE — Telephone Encounter (Signed)
 Patient called back requesting an appointment tomorrow to see about the anxiety medication, stating he changed his procedure to next week. Appointment scheduled for the patient.

## 2024-01-05 NOTE — Telephone Encounter (Addendum)
  FYI Only or Action Required?: Action required by provider: clinical question for provider, update on patient condition, and request for xanax  before scheduled procedure on 01/06/2024.  Patient was last seen in primary care on 12/01/2023 by Bevely Doffing, FNP.  Called Nurse Triage reporting Anxiety.  Symptoms began yesterday.  Interventions attempted: Nothing.  Symptoms are: unchanged.  Triage Disposition: See Physician Within 24 Hours  Patient/caregiver understands and will follow disposition?: No, refuses disposition   Copied from CRM [id number]. Topic: Clinical - Red Word Triage >> Jan 05, 2024  1:14 PM Thomas Rollins wrote: Pt says his anxiety is through the roof and he hasn't slept through the night.  Explained to patient that he could go to ED, reschedule procedure and meet with PCP regarding anxiety.  Patient refused all of these options.  Reason for Disposition  Patient sounds very upset or troubled to the triager  Answer Assessment - Initial Assessment Questions Patient requests call from physician regarding medication  1. CONCERN: Did anything happen that prompted you to call today?      Patient is concerned about hip and back injections that he is scheduled for tomorrow, patient admits that he is nervous about the pain associated with the injections 2. ANXIETY SYMPTOMS: Can you describe how you (your loved one; patient) have been feeling? (e.g., tense, restless, panicky, anxious, keyed up, overwhelmed, sense of impending doom).      insomnia 3. ONSET: How long have you been feeling this way? (e.g., hours, days, weeks)     Last night 4. SEVERITY: How would you rate the level of anxiety? (e.g., 0 - 10; or mild, moderate, severe).     severe 5. FUNCTIONAL IMPAIRMENT: How have these feelings affected your ability to do daily activities? Have you had more difficulty than usual doing your normal daily activities? (e.g., getting better, same, worse; self-care, school,  work, interactions)     Unable to sleep 6. HISTORY: Have you felt this way before? Have you ever been diagnosed with an anxiety problem in the past? (e.g., generalized anxiety disorder, panic attacks, PTSD). If Yes, ask: How was this problem treated? (e.g., medicines, counseling, etc.)     Has had anxiety in the past and used xanax  to treat with success  8. TREATMENT:  What has been done so far to treat this anxiety? (e.g., medicines, relaxation strategies). What has helped?     xanax   12. OTHER SYMPTOMS: Do you have any other symptoms? (e.g., feeling depressed, trouble concentrating, trouble sleeping, trouble breathing, palpitations or fast heartbeat, chest pain, sweating, nausea, or diarrhea)       Difficulty sleeping  Protocols used: Anxiety and Panic Attack-A-AH

## 2024-01-06 ENCOUNTER — Ambulatory Visit: Payer: Self-pay | Admitting: Family Medicine

## 2024-01-06 ENCOUNTER — Other Ambulatory Visit: Payer: Self-pay

## 2024-01-06 NOTE — Telephone Encounter (Signed)
 Has appointment isn't until 11/21.  Does he need a sooner appt?

## 2024-01-06 NOTE — Telephone Encounter (Signed)
 Pt states if he can get a earlier appointment, pt states was supposed to go the hip surgeon and he wants something for that procedure cause he was wondering if you can give him a Xanax  he can take a hour before the procedure cause he has a extreme phobia of needles

## 2024-01-06 NOTE — Telephone Encounter (Signed)
 Unfortunately, it does not look like we have ever prescribed Xanax  for him, so I will not be able to send this in for him.  Maybe he can reach out to the surgeon and see if you can provide him with something

## 2024-01-07 NOTE — Telephone Encounter (Signed)
 Advised With verbal understanding

## 2024-01-14 ENCOUNTER — Ambulatory Visit

## 2024-01-14 DIAGNOSIS — M1612 Unilateral primary osteoarthritis, left hip: Secondary | ICD-10-CM | POA: Diagnosis not present

## 2024-01-19 ENCOUNTER — Telehealth

## 2024-01-19 ENCOUNTER — Ambulatory Visit: Payer: Self-pay

## 2024-01-19 DIAGNOSIS — R252 Cramp and spasm: Secondary | ICD-10-CM

## 2024-01-19 MED ORDER — TIZANIDINE HCL 4 MG PO TABS: 4.0000 mg | ORAL_TABLET | Freq: Three times a day (TID) | ORAL | 1 refills | Status: DC | PRN

## 2024-01-19 NOTE — Telephone Encounter (Signed)
 FYI Only or Action Required?: Action required by provider: clinical question for provider and update on patient condition.  Patient was last seen in primary care on 12/01/2023 by Bevely Doffing, FNP.  Called Nurse Triage reporting Leg Pain.  Symptoms began yesterday.  Interventions attempted: Rest, hydration, or home remedies.  Symptoms are: unchanged.  Triage Disposition: See HCP Within 4 Hours (Or PCP Triage)  Patient/caregiver understands and will follow disposition?: No, wishes to speak with PCP  Copied from CRM #[id number]. Topic: Clinical - Red Word Triage >> Jan 19, 2024  8:49 AM Avram MATSU wrote: Red Word that prompted transfer to Nurse Triage: pain in left leg and muscle spasm, No ED and would like a video visit Reason for Disposition  [1] SEVERE pain (e.g., excruciating, unable to do any normal activities) AND [2] not improved after 2 hours of pain medicine  Answer Assessment - Initial Assessment Questions Patient reports pain to right leg with numbness and pain. Patient reports feeling like he is having terrible muscle spasm. Patient is asking if he can do a video visit-patient states he is unable to drive currently due to the pain. Patient states he can't go to the ED due to not being able to afford the copay. Asking for a follow up call.   1. ONSET: When did the pain start?      Started yesterday morning 2. LOCATION: Where is the pain located?      Right leg and hip 3. PAIN: How bad is the pain?    (Scale 1-10; or mild, moderate, severe)     10 4. WORK OR EXERCISE: Has there been any recent work or exercise that involved this part of the body?      no 5. CAUSE: What do you think is causing the leg pain?     Unsure-patient reports he received a cortisone shot in his left hip on Friday  6. OTHER SYMPTOMS: Do you have any other symptoms? (e.g., chest pain, back pain, breathing difficulty, swelling, rash, fever, numbness, weakness)     numbness  Protocols used:  Leg Pain-A-AH

## 2024-01-19 NOTE — Progress Notes (Signed)
 Virtual Visit via Video Note  I connected with Tore F Lefferts on 01/19/24 at  1:00 PM EST by a video enabled telemedicine application and verified that I am speaking with the correct person using two identifiers.  Patient Location: Home Provider Location: Office/Clinic  I discussed the limitations, risks, security, and privacy concerns of performing an evaluation and management service by video and the availability of in person appointments. I also discussed with the patient that there may be a patient responsible charge related to this service. The patient expressed understanding and agreed to proceed.  Subjective: PCP: Terry Wilhelmena Lloyd Hilario, FNP  Chief Complaint  Patient presents with   Leg Pain    Patient reports pain to right leg with numbness and pain. Patient reports feeling like he is having terrible muscle spasm   HPI   ROS: Per HPI  Current Outpatient Medications:    benzonatate  (TESSALON ) 200 MG capsule, Take 1 capsule (200 mg total) by mouth 2 (two) times daily as needed for cough., Disp: 20 capsule, Rfl: 0   blood glucose meter kit and supplies, Dispense based on patient and insurance preference. Use up to four times daily as directed. (FOR ICD-10 E10.9, E11.9)., Disp: 1 each, Rfl: 0   Buprenorphine HCl-Naloxone HCl 8-2 MG FILM, Place 8 mg of opioid under the tongue every 12 (twelve) hours., Disp: , Rfl:    cyclobenzaprine  (FLEXERIL ) 10 MG tablet, Take 1 tablet (10 mg total) by mouth 2 (two) times daily as needed., Disp: 30 tablet, Rfl: 2   Diclofenac  Sodium 3 % GEL, apply 1 gram TOPICALLY FOUR TIMES DAILY AS NEEDED FOR PAIN, Disp: 100 g, Rfl: 1   DULoxetine  (CYMBALTA ) 30 MG capsule, TAKE ONE CAPSULE BY MOUTH THREE TIMES DAILY, Disp: 270 capsule, Rfl: 0   gabapentin  (NEURONTIN ) 300 MG capsule, TAKE ONE CAPSULE BY MOUTH TWICE DAILY (ONLY taking one daily), Disp: 60 capsule, Rfl: 6   GLOBAL EASE INJECT PEN NEEDLES 31G X 8 MM MISC, Use to inject insulin  once daily, Disp:  100 each, Rfl: 3   Glucagon  (GVOKE HYPOPEN  2-PACK) 1 MG/0.2ML SOAJ, Inject 1 mg into the skin daily as needed., Disp: 0.4 mL, Rfl: 0   levothyroxine  (SYNTHROID ) 100 MCG tablet, Take 1 tablet (100 mcg total) by mouth daily., Disp: 90 tablet, Rfl: 0   lisinopril  (ZESTRIL ) 10 MG tablet, TAKE 1 TABLET BY MOUTH DAILY, Disp: 90 tablet, Rfl: 3   meloxicam  (MOBIC ) 15 MG tablet, Take 15 mg by mouth daily., Disp: , Rfl:    metFORMIN  (GLUCOPHAGE ) 1000 MG tablet, TAKE 1 TABLET BY MOUTH TWICE DAILY, Disp: 180 tablet, Rfl: 3   metoCLOPramide  (REGLAN ) 5 MG tablet, TAKE 1 TABLET BY MOUTH EVERY 8 HOURS AS NEEDED FOR NAUSEA OR FOR VOMITING, Disp: 20 tablet, Rfl: 1   mirtazapine  (REMERON ) 15 MG tablet, Take 1 tablet (15 mg total) by mouth at bedtime., Disp: 90 tablet, Rfl: 1   naloxone (NARCAN) nasal spray 4 mg/0.1 mL, Place 1 spray into the nose as needed (opioid overdose)., Disp: , Rfl:    ONETOUCH ULTRA test strip, USE UP TO FOUR TIMES DAILY AS DIRECTED (25 DAY supply), Disp: 100 strip, Rfl: 11   OZEMPIC , 2 MG/DOSE, 8 MG/3ML SOPN, INJECT 2 MG UNDER THE SKIN WEEKLY, Disp: 6 mL, Rfl: 2   simvastatin  (ZOCOR ) 40 MG tablet, TAKE 1 TABLET BY MOUTH EVERY EVENING, Disp: 100 tablet, Rfl: 3   tiZANidine  (ZANAFLEX ) 4 MG tablet, Take 1 tablet (4 mg total) by mouth every  8 (eight) hours as needed for muscle spasms., Disp: 30 tablet, Rfl: 1   triamcinolone  cream (KENALOG ) 0.1 %, Apply 1 Application topically 2 (two) times daily., Disp: 30 g, Rfl: 0  Observations/Objective: There were no vitals filed for this visit.  Physical Exam  Assessment and Plan: Leg cramp -     tiZANidine  HCl; Take 1 tablet (4 mg total) by mouth every 8 (eight) hours as needed for muscle spasms.  Dispense: 30 tablet; Refill: 1  Obesity, morbid (HCC)    Follow Up Instructions: No follow-ups on file.   I discussed the assessment and treatment plan with the patient. The patient was provided an opportunity to ask questions, and all were answered.  The patient agreed with the plan and demonstrated an understanding of the instructions.   The patient was advised to call back or seek an in-person evaluation if the symptoms worsen or if the condition fails to improve as anticipated.  The above assessment and management plan was discussed with the patient. The patient verbalized understanding of and has agreed to the management plan.   Leita Longs, FNP

## 2024-01-19 NOTE — Telephone Encounter (Signed)
 See if he can do virtual visit at 1:00

## 2024-01-21 DIAGNOSIS — R4182 Altered mental status, unspecified: Secondary | ICD-10-CM | POA: Diagnosis not present

## 2024-01-21 DIAGNOSIS — E860 Dehydration: Secondary | ICD-10-CM | POA: Diagnosis not present

## 2024-01-21 DIAGNOSIS — K7689 Other specified diseases of liver: Secondary | ICD-10-CM | POA: Diagnosis not present

## 2024-01-21 DIAGNOSIS — E1165 Type 2 diabetes mellitus with hyperglycemia: Secondary | ICD-10-CM | POA: Diagnosis not present

## 2024-01-21 DIAGNOSIS — E871 Hypo-osmolality and hyponatremia: Secondary | ICD-10-CM | POA: Diagnosis not present

## 2024-01-21 DIAGNOSIS — Z7985 Long-term (current) use of injectable non-insulin antidiabetic drugs: Secondary | ICD-10-CM | POA: Diagnosis not present

## 2024-01-21 DIAGNOSIS — S79811A Other specified injuries of right hip, initial encounter: Secondary | ICD-10-CM | POA: Diagnosis not present

## 2024-01-21 DIAGNOSIS — R252 Cramp and spasm: Secondary | ICD-10-CM | POA: Diagnosis not present

## 2024-01-21 DIAGNOSIS — N179 Acute kidney failure, unspecified: Secondary | ICD-10-CM | POA: Diagnosis not present

## 2024-01-21 DIAGNOSIS — E875 Hyperkalemia: Secondary | ICD-10-CM | POA: Diagnosis not present

## 2024-01-21 DIAGNOSIS — G8929 Other chronic pain: Secondary | ICD-10-CM | POA: Diagnosis not present

## 2024-01-21 DIAGNOSIS — M79671 Pain in right foot: Secondary | ICD-10-CM | POA: Diagnosis not present

## 2024-01-21 DIAGNOSIS — R4781 Slurred speech: Secondary | ICD-10-CM | POA: Diagnosis not present

## 2024-01-21 DIAGNOSIS — I10 Essential (primary) hypertension: Secondary | ICD-10-CM | POA: Diagnosis not present

## 2024-01-21 DIAGNOSIS — R739 Hyperglycemia, unspecified: Secondary | ICD-10-CM | POA: Diagnosis not present

## 2024-01-21 DIAGNOSIS — E1142 Type 2 diabetes mellitus with diabetic polyneuropathy: Secondary | ICD-10-CM | POA: Diagnosis not present

## 2024-01-21 DIAGNOSIS — H9192 Unspecified hearing loss, left ear: Secondary | ICD-10-CM | POA: Diagnosis not present

## 2024-01-21 DIAGNOSIS — Z79899 Other long term (current) drug therapy: Secondary | ICD-10-CM | POA: Diagnosis not present

## 2024-01-21 DIAGNOSIS — Z7984 Long term (current) use of oral hypoglycemic drugs: Secondary | ICD-10-CM | POA: Diagnosis not present

## 2024-01-21 DIAGNOSIS — S8981XA Other specified injuries of right lower leg, initial encounter: Secondary | ICD-10-CM | POA: Diagnosis not present

## 2024-01-21 DIAGNOSIS — E1140 Type 2 diabetes mellitus with diabetic neuropathy, unspecified: Secondary | ICD-10-CM | POA: Diagnosis not present

## 2024-01-21 DIAGNOSIS — Z79891 Long term (current) use of opiate analgesic: Secondary | ICD-10-CM | POA: Diagnosis not present

## 2024-01-21 DIAGNOSIS — Z87891 Personal history of nicotine dependence: Secondary | ICD-10-CM | POA: Diagnosis not present

## 2024-01-21 DIAGNOSIS — Z9109 Other allergy status, other than to drugs and biological substances: Secondary | ICD-10-CM | POA: Diagnosis not present

## 2024-01-21 DIAGNOSIS — Z91014 Allergy to mammalian meats: Secondary | ICD-10-CM | POA: Diagnosis not present

## 2024-01-21 DIAGNOSIS — R296 Repeated falls: Secondary | ICD-10-CM | POA: Diagnosis not present

## 2024-01-21 DIAGNOSIS — G629 Polyneuropathy, unspecified: Secondary | ICD-10-CM | POA: Diagnosis not present

## 2024-01-21 DIAGNOSIS — Z885 Allergy status to narcotic agent status: Secondary | ICD-10-CM | POA: Diagnosis not present

## 2024-01-21 DIAGNOSIS — K4020 Bilateral inguinal hernia, without obstruction or gangrene, not specified as recurrent: Secondary | ICD-10-CM | POA: Diagnosis not present

## 2024-01-21 DIAGNOSIS — Z880 Allergy status to penicillin: Secondary | ICD-10-CM | POA: Diagnosis not present

## 2024-01-21 NOTE — Progress Notes (Signed)
 Thomas Rollins                                          MRN: [id number]   01/21/2024   The VBCI Quality Team Specialist reviewed this patient medical record for the purposes of chart review for care gap closure. The following were reviewed: abstraction for care gap closure-kidney health evaluation for diabetes:eGFR  and uACR.    VBCI Quality Team

## 2024-01-21 NOTE — ED Notes (Signed)
 Patient informed nurse he has also had chest pain today. Tender to touch. Dr. Moody notified.

## 2024-01-21 NOTE — H&P (Signed)
 ------------------------------------------------------------------------------- Attestation signed by Julieann Elspeth Crawley, MD at 01/24/24 1245 I was the supervising physician in the delivery of the service. Elspeth GORMAN Julieann, MD   -------------------------------------------------------------------------------     History and Physical [facility]   01/21/24    Patient name: Thomas Rollins DOB 03-12-1960 MRN#: [id number] PCP: [facility], [facility] Time: 4:45 PM Primary Care Provider:  Med[facility], [facility] Inpatient primary attending provider: Katheryn Jewels, DO  _________________________________________________________________________  Admission HPI   Patient admitted on: 01/21/2024 11:53 AM  Patient admitted by: No admitting provider for patient encounter.   CHIEF COMPLAINT: Slurred speech and numbness and tingling  Day of admission HPI:  Thomas Rollins  is a 63 y.o. male with a PMH significant for pain management; diabetes mellitus; neuropathy; hypertension; hyperlipidemia who presented with complaint of slurred speech where he is speaking slowly over the last 1 week and worse over the last 2 days, right leg pain, right hip pain, muscle cramps, neuropathy bilateral lower legs.  Patient states that the symptoms started approximately a week ago.  He was stubborn and did not want to come in when it started but finally realized that he needs to have it evaluated.  His PCP is [facility] and he has not made any appointments to see them for the any of these issues.  However, he was prescribed Zanaflex  on November 26 from [facility].  This helped him more for his muscle spasms then did Flexeril  (has been taking Flexeril  on a regular basis in the past and had a supply of it).  Patient also is seeing a pain doctor for his chronic pain.  For the last 17 to 18 months, he has been on Suboxone.  He had a morphine addiction for many  years.  He states that he was taking 190 mg of morphine daily divided into 2 equal doses.  He tapered himself down to 30 mg a day and then saw his pain management doctor who agreed to provide Suboxone for him.  ER evaluation was significant for low sodium of 125 and a potassium of 6.4 with glucose of 409 and a creatinine of 2.73 (previous creatinine was in the normal range of 1.1).  Chloride is low at 88, anion gap is high at 13, BUN is high at 44.  After giving him fluids repeat BMP showed sodium improved to 129 and potassium was normal at 5.0.  Chloride improved to 92.  Creatinine improved to 2.58.  Glucose improved to 270.  Anion gap improved to 12 and BUN improved to 42.  Troponin was negative at 6  Urine drug screen is negative  All of the following were normal: Alcohol = less than 3; lipase = 40; magnesium = 1.8. Urinalysis showed glucose greater than 1000 otherwise negative.  He will be admitted as inpatient to the hospitalist service for further evaluation and treatment of his AKI and management of his electrolyte abnormality, hyperglycemia, muscle spasm, slurred speech.  Patient admitted on Home O2? - no Patient on home anticoagulant? -  no Patient admitted with Chronic home foley catheter? - no Foley catheter placed or replaced by another service prior to admission? - no Central Line Status: NONE  Mental Status on Admission: The patient is Alert and oriented to PERSON The patient is Alert And oriented to TIME The patient is Alert and oriented to LOCATION  Problem List, Assessment & Plan    ASSESSMENT & PLAN (In order of descending acuity)  Principal Problem:  Acute kidney injury Active Problems:   Slurred speech   Hyperkalemia   Hyperglycemia   Muscle cramps   Neuropathy       Acute kidney injury Admit to hospitalist service as inpatient to the MedSurg unit with probability of greater than 48-hour stay Creatinine = 2.73 and after giving fluids in the ER,  improved to 2.58.;  It was 1.1 on December 01, 2023 office visit to his PCP.  And it was 1.0 on December 03, 2022. Will give IV fluids and monitor closely. Avoid nephrotoxic agents    Slurred speech Speech consult    Hyperkalemia Initial potassium = 6.4 and improved to 5.0. We will continue to monitor. IV fluids of normal saline at 150 cc/h    Hyperglycemia History of type 2 diabetes Initial POC glucose = 409 and improved to 270. Correctional insulin     Muscle cramps Continue tizanidine  4 mg 3 times a day Most likely due to hyperkalemia and dehydration Currently asymptomatic after treatment in ER    Neuropathy Most likely due to diabetic neuropathy Continue gabapentin  Will monitor closely  Incidental Findings for ourpatient Follow-Up: No significant incidental findings present   ADDITIONAL NON-ACUTE FINDINGS, OBSERVATIONS, FAMILY DISCUSSIONS, ETC. (When present):  Physical exam General: No acute distress except slow speech pattern and possibly some slurred speech HEENT [place]/AT; dry mucous membrane Heart: Heart rate 70s: Rhythm is reg    Lungs: Clear to auscultation bilaterally Abdomen: Obese, soft nontender nondistended Extremities: No peripheral edema; able to move all extremities independently well. Skin: Dry no rash except mild discoloration in the right anterior tib-fib from injury from sting trunk falling on his right lower leg and foot.  Well-healed and no further treatment needed. Neuropsych: Within normal limits; no neurological deficits noted on examination.  Upper and lower extremity have full range of motion and are equal and strong bilaterally.  No neuropathy appreciated on gross exam.   DVT Prophylaxis Ordered: SQ Heparin  __________________________________________________________________________  Temp:  [36.3 C (97.4 F)] 36.3 C (97.4 F) Pulse:  [67-78] 70 SpO2 Pulse:  [68-75] 69 Resp:  [7-21] 20 BP: (110-171)/(65-88) 110/65 SpO2:  [97 %-100 %] 98 % Body  mass index is 34.73 kg/m. Intake/Output last 3 shifts: No intake/output data recorded.  Consults Requested  None     In hospital Nutrition: No diet orders on file    An advanced care planning discussion was  had with patient and/or patient's decisions maker (documented separately).  CODE STATUS :                  Full code  Given this patient's known comorbid illnesses and condition Present on Admission, plan of care includes acute interventions of AKI; slurred speech; hyperkalemia; muscle cramps; neuropathy; hyperglycemia as noted in the Problem List, Assessment & Plan noted above.  I fully expect, with this information in hand, that this patient will require at least two medically necessary midnights of hospital care prior to a safe discharge.   The patient's hospital stay is complicated by the above clinically significant conditions, as documented in Assessment and Plan, which are present on admission and requiring additional evaluation and treatment or having a significant effect of this patient's care.   __________________________________________________________  Allergies  Allergen Reactions  . Codeine Other (See Comments)    Slurred Speech  . Other     [place] - N/V    sweat potatoes - N/V  . Penicillins Rash     Past Medical History[1]  Past Surgical History[2]  Family History[3]       Current Medications[4]  REFER TO EPIC FOR FULL LIST OF CURRENT MEDICATIONS ORDERED ON ADMISSION. THESE ORDERS APPEAR ONLY WHEN RELEASED, WHICH MAY HAPPEN AFTER ADMISSION ONCE PATIENT IS TRANSFERRED FROM THE ED.  Allergies  Allergies[5]  Imaging  CT Head Wo Contrast Result Date: 01/21/2024 Exam:  CT Head without Contrast  History:  AMS  Technique: Routine brain CT without IV contrast. AEC (automated exposure control) and/or manual techniques such as size-specific kV and mAs are employed where appropriate to reduce radiation exposure for all CT exams.  Comparison:  CT head  03/13/2023  Findings:   BRAIN:  No CT evidence of acute infarction, hemorrhage, edema, mass or mass effect. Ventricles and basilar cisterns are unremarkable.  SOFT TISSUES:  Negative. CALVARIUM:  Negative. No fracture. SINUSES AND MASTOIDS:  No mucosal thickening or fluid.     -No acute intracranial abnormality.  Signed (Electronic Signature): 01/21/2024 2:27 PM Signed By: Henrene Alas, MD  XR Hip 2 Views Right Result Date: 01/21/2024 Exam:  Right Hip   History:  Multiple falls.  Technique:  2 views  Comparison:  None.  Findings:  There is no acute fracture or malalignment. No bone destruction or erosion. The visualized joint spaces and periarticular soft tissues are normal. There is no radiopaque foreign body.  Normal appearance of the imaged osseous pelvis.    Negative right hip exam.    Signed (Electronic Signature): 01/21/2024 2:21 PM Signed By: Tommi Seaman, MD  XR Foot 3 Or More Views Right Result Date: 01/21/2024 Exam:  Right Foot  History:  Pain.  Technique:  3 views  Comparison:  None.  Findings:   No acute fracture or traumatic malalignment. No aggressive osseous lesions. Scattered degenerative changes. Calcaneal spurring. No ankle effusion.    No acute fracture or malalignment.  Signed (Electronic Signature): 01/21/2024 2:19 PM Signed By: Tommi Seaman, MD  XR Knee 1 or 2 Views Right Result Date: 01/21/2024 Exam:  Right Knee  History:  Falls.  Technique:  2 views  Comparison:  None.  Findings:  No acute fracture or traumatic malalignment. Cruciate ligament reconstruction anchors noted. No aggressive osseous lesions. Mild prepatellar soft tissue swelling and swelling along the distal thigh. No significant effusion..    No acute fracture or traumatic malalignment.    Signed (Electronic Signature): 01/21/2024 2:18 PM Signed By: Tommi Seaman, MD  XR Chest Portable Result Date: 01/21/2024 Exam:  Portable Chest  History:  Altered mental status  Technique:  Single  frontal view.  Comparison:  CT chest abdomen pelvis 06/16/2016  Findings:   The heart size, mediastinal contours, pleural surfaces and pulmonary vascularity are all normal. The lungs are clear. There is no detectable pleural effusion or pneumothorax. The regional bones are normal for age.    Negative portable chest xray exam.    Signed (Electronic Signature): 01/21/2024 12:39 PM Signed By: Luetta Ambrosia, MD  ECG 12 Lead Result Date: 01/21/2024 Sinus rhythm with 1st degree AV block Low voltage QRS Borderline ECG No previous ECGs available   Lab Results   Recent Labs    01/21/24 1226  WBC 13.4*  HGB 11.3*  HCT 33.1*  PLT 238   Recent Labs    01/21/24 1226 01/21/24 1450  NA 125* 129*  K 6.4* 5.0  CL 88* 92*  CO2 23.9 24.6  BUN 44* 42*  CREATININE 2.73* 2.58*  GLU 409* 270*  CALCIUM 9.9 9.2  ALBUMIN 4.5  --   PROT 7.5  --  BILITOT 0.5  --   AST 32  --   ALT 48  --   ALKPHOS 88  --   MG 1.8  --   LIPASE 40  --    Recent Labs    01/21/24 1226  TROPONINI 6  INR 1.08   Recent Labs    01/21/24 1333  WBCUA <1  NITRITE Negative  LEUKOCYTESUR Negative  BACTERIA None Seen  RBCUA 1  BLOODU Negative  GLUCOSEU >1000 mg/dL*  PROTEINUA Negative  KETONESU Negative   Recent Labs    01/21/24 1226 01/21/24 1333  OPIAU  --  Negative  BENZU  --  Negative  AMPHU  --  Negative  COCAU  --  Negative  CANNAU  --  Negative  BARBU  --  Negative  ETOH <3  --    No results for input(s): PREGTESTUR, PREGPOC in the last 72 hours. No results for input(s): OCCULTBLD, RAPSCRN, CDIFRPCR, CDIFFNAP1, A1C, CHOL, LDL, HDL, TRIG in the last 72 hours. No results for input(s): O2SOUR, FIO2ART, PHART, PCO2ART, PO2ART, HCO3ART, O2SATART, BEART in the last 72 hours. Pending Labs     Order Current Status   Beta Hydroxybutyrate In process       Home Medications   Prior to Admission medications  Medication Dose, Route, Frequency   buprenorphine-naloxone (SUBOXONE) 8-2 mg sublingual film 1 Film, Sublingual, Daily (standard)  cyclobenzaprine  (FLEXERIL ) 10 MG tablet 1 tablet, Daily (standard)  DULoxetine  (CYMBALTA ) 30 MG capsule 30 mg, 3 times a day (standard)  lisinopril  (PRINIVIL ,ZESTRIL ) 10 MG tablet 10 mg, Daily (standard)  meloxicam  (MOBIC ) 15 MG tablet 1 tablet, Daily (standard)  metFORMIN  (GLUCOPHAGE ) 1000 MG tablet 1,000 mg  mirtazapine  (REMERON ) 15 MG tablet 15 mg  OZEMPIC  2 mg/dose (8 mg/3 mL) PnIj INJECT 2 MG UNDER THE SKIN WEEKLY  simvastatin  (ZOCOR ) 40 MG tablet 40 mg, Every evening  tizanidine  (ZANAFLEX ) 4 MG tablet 4 mg  ibuprofen  (MOTRIN ) 400 MG tablet 400 mg, Oral, Every 6 hours PRN  naproxen (NAPROSYN) 500 MG tablet 500 mg, Oral, 2 times a day PRN   Joana JONETTA Hurst, PA Hospitalist, [facility] 01/21/24, 4:45 PM       [1] Past Medical History: Diagnosis Date  . Diabetes mellitus (CMS-HCC)   [2] No past surgical history on file. [3] History reviewed. No pertinent family history. [4] No current facility-administered medications for this encounter.  Current Outpatient Medications:  .  buprenorphine-naloxone (SUBOXONE) 8-2 mg sublingual film, Place 1 Film (8 mg of buprenorphine total) under the tongue daily., Disp: , Rfl:  .  cyclobenzaprine  (FLEXERIL ) 10 MG tablet, Take 1 tablet (10 mg total) by mouth daily., Disp: , Rfl:  .  DULoxetine  (CYMBALTA ) 30 MG capsule, Take 1 capsule (30 mg total) by mouth Three (3) times a day., Disp: , Rfl:  .  lisinopril  (PRINIVIL ,ZESTRIL ) 10 MG tablet, Take 1 tablet (10 mg total) by mouth daily., Disp: , Rfl:  .  meloxicam  (MOBIC ) 15 MG tablet, Take 1 tablet (15 mg total) by mouth daily., Disp: , Rfl:  .  metFORMIN  (GLUCOPHAGE ) 1000 MG tablet, Take 1 tablet (1,000 mg total) by mouth., Disp: , Rfl:  .  mirtazapine  (REMERON ) 15 MG tablet, Take 1 tablet (15 mg total) by mouth., Disp: , Rfl:  .  OZEMPIC  2 mg/dose (8 mg/3 mL) PnIj, INJECT 2 MG UNDER THE SKIN WEEKLY,  Disp: , Rfl:  .  simvastatin  (ZOCOR ) 40 MG tablet, Take 1 tablet (40 mg total) by mouth every evening., Disp: , Rfl:  .  tizanidine  (ZANAFLEX ) 4 MG tablet, Take 1 tablet (4 mg total) by mouth., Disp: , Rfl:  .  ibuprofen  (MOTRIN ) 400 MG tablet, Take 1 tablet (400 mg total) by mouth every six (6) hours as needed for pain., Disp: 30 tablet, Rfl: 0 .  naproxen (NAPROSYN) 500 MG tablet, Take 1 tablet (500 mg total) by mouth two (2) times a day as needed (pain)., Disp: 20 tablet, Rfl: 0 [5] Allergies Allergen Reactions  . Codeine Other (See Comments)    Slurred Speech  . Other     [place] - N/V    sweat potatoes - N/V  . Penicillins Rash

## 2024-01-21 NOTE — ED Triage Notes (Signed)
 Patient reports multiple falls this week after receiving steroid injection in left leg. Patient c/o right leg pain, bilateral hand pain, impaired hearing left ear that started 1 week ago. Patient reports he was prescribed Meloxicam  and Zanaflex  for muscle spasms. Patient also reports he takes Suboxone. Patient also c/o loss of sensation in LLE with pressure in left foot.

## 2024-01-21 NOTE — ACP (Advance Care Planning) (Signed)
 ADVANCE CARE PLANNING NOTE  Discussion Date:  January 21, 2024  Patient has decisional capacity:  Yes  Patient has selected a Health Care Decision-Maker if loses capacity: No  Health Care Decision Maker as of 01/21/2024   Discussion Participants: Myself and patient  Communication of Medical Status/Prognosis:  Yes  Communication of Treatment Goals/Options:  Yes  Treatment Decisions:  Full code     I spent 17 minutes providing voluntary advance care planning services for this patient.

## 2024-01-22 NOTE — Care Plan (Signed)
 Shift Summary Correctional insulin  was administered after a high blood glucose reading of 268 mg/dL.  Neurological and musculoskeletal assessments showed alertness, appropriate cognition, and full movement in all extremities.  Peripheral IV site remained clean, dry, and intact, with no intervention needed.  Safety interventions were consistently implemented throughout the shift.  Overall, comfort was maintained and no acute changes were noted during the shift.   Optimal Comfort and Wellbeing: Pain score remained low at 2 throughout the shift, and no interventions for pain were required; speech was noted to be slurred, but level of consciousness and cognition were appropriate.   Readiness for Transition of Care: Unplanned readmission score increased slightly during the shift, and IRF and LTACH candidacy scores were documented.   Improved Ability to Complete Activities of Daily Living: Mobility was slightly limited and activity was occasional walking; musculoskeletal assessments showed full movement and normal power in all extremities.   Skin Health and Integrity: Bruising was present and Braden score was 18, indicating some risk; peripheral IV site remained clean, dry, and intact with no intervention needed.

## 2024-01-23 DIAGNOSIS — I10 Essential (primary) hypertension: Secondary | ICD-10-CM | POA: Diagnosis not present

## 2024-01-23 DIAGNOSIS — E1140 Type 2 diabetes mellitus with diabetic neuropathy, unspecified: Secondary | ICD-10-CM | POA: Diagnosis not present

## 2024-01-23 NOTE — Nursing Note (Signed)
-------------------------------------------------------------------------------   Attestation signed by Bretta Joana Chamber, PA at 01/23/24 [phone] Agree with assessment and plan -------------------------------------------------------------------------------  The patient has a mobility limitation of weakness that impairs their ability to participate in one or more daily activities. The patients mobility limitation can be sufficiently resolved with use of a walker. The patient can safely ambulate with the walker.

## 2024-01-23 NOTE — Discharge Summary (Signed)
 ------------------------------------------------------------------------------- Attestation signed by Julieann Elspeth Crawley, MD at 01/24/24 1246 I was the supervising physician in the delivery of the service. Elspeth GORMAN Julieann, MD  -------------------------------------------------------------------------------   [facility]   Discharge date:   January 23, 2024 Length of stay:    LOS: 2 days    Discharge Service:   [facility] Hospitalists Discharge Attending Physician: Joana Marice Hurst, PA Discharge to:    To Home Condition at Discharge:  stable Code status:                         Full Code   Hospital Course: No notes on file  ______________________________________  Admission HPI    Patient admitted on: 01/21/2024 11:53 AM  Patient admitted by: No admitting provider for patient encounter.    CHIEF COMPLAINT: Slurred speech and numbness and tingling   Day of admission HPI:  Thomas Rollins  is a 63 y.o. male with a PMH significant for pain management; diabetes mellitus; neuropathy; hypertension; hyperlipidemia who presented with complaint of slurred speech where he is speaking slowly over the last 1 week and worse over the last 2 days, right leg pain, right hip pain, muscle cramps, neuropathy bilateral lower legs.   Patient states that the symptoms started approximately a week ago.  He was stubborn and did not want to come in when it started but finally realized that he needs to have it evaluated.  His PCP is [facility] and he has not made any appointments to see them for the any of these issues.   However, he was prescribed Zanaflex  on November 26 from [facility].  This helped him more for his muscle spasms then did Flexeril  (has been taking Flexeril  on a regular basis in the past and had a supply of it).   Patient also is seeing a pain doctor for his chronic pain.  For the last 17 to 18 months, he has been on  Suboxone.  He had a morphine addiction for many years.  He states that he was taking 190 mg of morphine daily divided into 2 equal doses.  He tapered himself down to 30 mg a day and then saw his pain management doctor who agreed to provide Suboxone for him.   ER evaluation was significant for low sodium of 125 and a potassium of 6.4 with glucose of 409 and a creatinine of 2.73 (previous creatinine was in the normal range of 1.1).  Chloride is low at 88, anion gap is high at 13, BUN is high at 44.   After giving him fluids repeat BMP showed sodium improved to 129 and potassium was normal at 5.0.  Chloride improved to 92.  Creatinine improved to 2.58.  Glucose improved to 270.  Anion gap improved to 12 and BUN improved to 42.   Troponin was negative at 6   Urine drug screen is negative   All of the following were normal: Alcohol = less than 3; lipase = 40; magnesium = 1.8. Urinalysis showed glucose greater than 1000 otherwise negative.   He will be admitted as inpatient to the hospitalist service for further evaluation and treatment of his AKI and management of his electrolyte abnormality, hyperglycemia, muscle spasm, slurred speech.   Patient admitted on Home O2? - no Patient on home anticoagulant? -  no Patient admitted with Chronic home foley catheter? - no Foley catheter placed or replaced by another service prior to admission? -  no Central Line Status: NONE   Mental Status on Admission: The patient is Alert and oriented to PERSON The patient is Alert And oriented to TIME The patient is Alert and oriented to LOCATION   Problem List, Assessment & Plan     ASSESSMENT & PLAN (In order of descending acuity)   Principal Problem:   Acute kidney injury Active Problems:   Slurred speech   Hyperkalemia   Hyperglycemia   Muscle cramps   Neuropathy       Acute kidney injury Admit to hospitalist service as inpatient to the MedSurg unit with probability of greater than 48-hour  stay Creatinine = 2.73 and after giving fluids in the ER, improved to 2.58.;  It was 1.1 on December 01, 2023 office visit to his PCP.  And it was 1.0 on December 03, 2022.  => On January 22, 2024 creatinine = 1.99 => November 30 = 1.43 Will give IV fluids and monitor closely. Avoid nephrotoxic agents     Slurred speech Speech consult     Hyperkalemia Initial potassium = 6.4 and improved to 5.0.  => 4.1 on 01/23/2024 We will continue to monitor. IV fluids of normal saline at 150 cc/h     Hyperglycemia History of type 2 diabetes Initial POC glucose = 409 and improved to 270.  => 260s to 300s on 01/23/2024 Correctional insulin      Muscle cramps Continue tizanidine  4 mg 3 times a day Most likely due to hyperkalemia and dehydration => able to walk well yesterday morning with no weakness or muscle cramps with PT Currently asymptomatic after treatment in ER     Neuropathy Most likely due to diabetic neuropathy Continue gabapentin  Will monitor closely   Incidental Findings for ourpatient Follow-Up: No significant incidental findings present    ADDITIONAL NON-ACUTE FINDINGS, OBSERVATIONS, FAMILY DISCUSSIONS, ETC. (When present):   Physical exam General: No acute distress patient's slow speech has fully resolved HEENT [place]/AT; moist mucous membrane Heart: Heart rate 60s and 70s: Rhythm is reg    Lungs: Clear to auscultation bilaterally Abdomen: Obese, soft nontender nondistended Extremities: No peripheral edema; able to move all extremities independently well. Skin: Dry no rash except mild discoloration in the right anterior tib-fib from injury from sting trunk falling on his right lower leg and foot.  Well-healed and no further treatment needed. Neuropsych: Within normal limits; no neurological deficits noted on examination.  Upper and lower extremity have full range of motion and are equal and strong bilaterally.  No neuropathy appreciated on gross exam.  => PT working with patient today  and he was able to walk up and down the halls with use of a walker.  Patient states that he is walking much better and only needs front wheel walker at discharge     Discharge plan November 30 , 2025 => Patient is clinically stable to be discharged home Sodium = 133 this morning Potassium = 4.1 = normal => will continue to monitor => creatinine = 1.43 => patient encouraged to drink 64 ounces of water daily for the next 3 to 4 days minimum to continue to improve his kidney function.  Additionally I will continue his IV antibiotics and gave him an additional 500 mL bolus at shift change and before discharge   DVT Prophylaxis Ordered: SQ Heparin  ______________________________________  Mental Status On day of Discharge:  The patient is Alert and oriented to PERSON The patient is Alert And oriented to TIME The patient is Alert and oriented to  LOCATION  CODE STATUS :                    Full Code   An advanced care planning discussion was  had with patient and/or patient's decisions maker (documented separately).  Patient discharged on Home O2? - no Patient discharged on home anticoagulant? -  no  Foley Catheter status: None Central Line Status: NONE  Time Spent on Discharge I spent greater than 30 minutes counseling and coordinating care for the discharge of this patient. The patient and I discussed the importance of outpatient follow-up as well as concerning signs and symptoms that would require immediate evaluation by a medical professional. The aforementioned conversation participants understand  and did show insight. I did use teachback to ensure understanding. The above participant/s is aware that not following the discussed plan, recommendations, and follow up can lead to severe negative effects on the patient's health, up to and including death.  Discharge Medications     Your Medication List     STOP taking these medications    cyclobenzaprine  10 MG tablet Commonly known  as: FLEXERIL    ibuprofen  400 MG tablet Commonly known as: MOTRIN    meloxicam  15 MG tablet Commonly known as: MOBIC    naproxen 500 MG tablet Commonly known as: NAPROSYN       CONTINUE taking these medications    buprenorphine-naloxone 8-2 mg sublingual film Commonly known as: SUBOXONE Place 1 Film (8 mg of buprenorphine total) under the tongue daily.   DULoxetine  30 MG capsule Commonly known as: CYMBALTA  Take 1 capsule (30 mg total) by mouth Three (3) times a day.   lisinopril  10 MG tablet Commonly known as: PRINIVIL ,ZESTRIL  Take 1 tablet (10 mg total) by mouth daily.   metFORMIN  1000 MG tablet Commonly known as: GLUCOPHAGE  Take 1 tablet (1,000 mg total) by mouth.   mirtazapine  15 MG tablet Commonly known as: REMERON  Take 1 tablet (15 mg total) by mouth.   OZEMPIC  2 mg/dose (8 mg/3 mL) Pnij Generic drug: semaglutide  INJECT 2 MG UNDER THE SKIN WEEKLY   simvastatin  40 MG tablet Commonly known as: ZOCOR  Take 1 tablet (40 mg total) by mouth every evening.   tizanidine  4 MG tablet Commonly known as: ZANAFLEX  Take 1 tablet (4 mg total) by mouth.       _____________________________________  Nutrition:                                    ___________________________________________  Discharge Instructions   You came into our hospital with numbness and tingling and weakness.  You regained most of your coordination and your symptoms improved dramatically.  Your CT of the head was negative. Your exam did not show that you had symptoms consistent with a stroke.  However, you will still need an MRI as an outpatient basis.  We recommend strongly that you follow-up with a family doctor in the next 1 to 2 days and let them know you are hospitalized with these concerns.  Your family doctor will have to order that outpatient MRI if he thinks it is appropriate.  Your potassium was really really high and that has improved.  As your potassium improved, your symptoms of muscle  cramps and numbness and tingling also improved.  Your kidney function was also very bad.  You are on the following 3 medications for pain that are very bad for your kidneys under the circumstances.  I recommend you stop taking these medications and use Tylenol instead. Stop ibuprofen , naproxen, Mobic  (also known as meloxicam ).  They are all NSAIDs and they all do the same thing and you do not need all of these medications.  They will damage your kidneys if you take them for long periods of time.  Your creatinine improved to 1.43 on morning labs of the day of admission.  Since then you continue to get more IV fluids and I suspect that your creatinine is even lower and better than 1.43.  I would like you to go home and continue drinking plenty of water.  64 ounces per day minimum.  Do that for the next 3 to 5 days.  It is been a pleasure taking care of you. I hope you get well soon. If there is any questions or concerns, please do not hesitate to return to our ER or go to the closest ER.  Nutrition:                                   Activity:                                   Activity Instructions     Activity as tolerated         Appointments:                          Follow Up:                              Follow Up instructions and Outpatient Referrals    Call MD for:  difficulty breathing, headache or visual disturbances     Call MD for:  persistent dizziness or light-headedness     Call MD for:  persistent nausea or vomiting     Call MD for:  severe uncontrolled pain     Call MD for: Temperature > 38.5 Celsius ( > 101.3 Fahrenheit)     Discharge instructions         Allergies  Allergen Reactions  . Codeine Other (See Comments)    Slurred Speech  . [place]   . Other     [place] - N/V    sweat potatoes - N/V  . Penicillins Rash     Past Medical History[1]  Past Surgical History[2]   Family History[3]   Current Medications[4]  Imaging  CT Abdomen Pelvis Wo  Contrast Result Date: 01/21/2024 Exam:  CT of the Abdomen and Pelvis without Contrast  History:  Elevated kidney function  Technique: Routine CT of the abdomen and pelvis without IV contrast. Oral Contrast:  no.  AEC (automated exposure control) and/or manual techniques such as size-specific kV and mAs are employed where appropriate to reduce radiation exposure for all CT exams.  Comparison:   CT chest abdomen pelvis 06/16/2016  Abdomen and Pelvis CT Findings:  LOWER THORAX:  Calcified granuloma lingula. Coronary artery calcifications. Right Bochdalek hernia containing fat.  HEPATOBILIARY:  Nodular liver contour. Question low-attenuation focus left hepatic lobe series 2 image 54. Few calcified granulomas. Contracted gallbladder. No biliary duct dilation.  PANCREAS:  Normal.  SPLEEN:  Normal.  ADRENALS:  Normal.  KIDNEYS/URETERS:  No hydronephrosis. Bilateral perinephric fat stranding.  VASCULAR:  The aorta is not  dilated.  Atherosclerotic calcifications.  LYMPH NODES:  No adenopathy.  BOWEL/MESENTERY:  No abnormal bowel dilation. Normal appendix.  PELVIC ORGANS:  No pelvic mass, abscess, adenopathy or inflammatory change.  BONES/SOFT TISSUES:  Small bilateral inguinal hernias containing fat.    No hydronephrosis. Bilateral perinephric fat stranding. Nodular contour of the liver. Question low-attenuation focus in the left hepatic lobe. Recommend outpatient contrast enhanced abdominal MRI for further evaluation.  Signed (Electronic Signature): 01/21/2024 4:56 PM Signed By: Luetta Ambrosia, MD  CT Head Wo Contrast Result Date: 01/21/2024 Exam:  CT Head without Contrast  History:  AMS  Technique: Routine brain CT without IV contrast. AEC (automated exposure control) and/or manual techniques such as size-specific kV and mAs are employed where appropriate to reduce radiation exposure for all CT exams.  Comparison:  CT head 03/13/2023  Findings:   BRAIN:  No CT evidence of acute infarction, hemorrhage, edema,  mass or mass effect. Ventricles and basilar cisterns are unremarkable.  SOFT TISSUES:  Negative. CALVARIUM:  Negative. No fracture. SINUSES AND MASTOIDS:  No mucosal thickening or fluid.     -No acute intracranial abnormality.  Signed (Electronic Signature): 01/21/2024 2:27 PM Signed By: Henrene Alas, MD  XR Hip 2 Views Right Result Date: 01/21/2024 Exam:  Right Hip   History:  Multiple falls.  Technique:  2 views  Comparison:  None.  Findings:  There is no acute fracture or malalignment. No bone destruction or erosion. The visualized joint spaces and periarticular soft tissues are normal. There is no radiopaque foreign body.  Normal appearance of the imaged osseous pelvis.    Negative right hip exam.    Signed (Electronic Signature): 01/21/2024 2:21 PM Signed By: Tommi Seaman, MD  XR Foot 3 Or More Views Right Result Date: 01/21/2024 Exam:  Right Foot  History:  Pain.  Technique:  3 views  Comparison:  None.  Findings:   No acute fracture or traumatic malalignment. No aggressive osseous lesions. Scattered degenerative changes. Calcaneal spurring. No ankle effusion.    No acute fracture or malalignment.  Signed (Electronic Signature): 01/21/2024 2:19 PM Signed By: Tommi Seaman, MD  XR Knee 1 or 2 Views Right Result Date: 01/21/2024 Exam:  Right Knee  History:  Falls.  Technique:  2 views  Comparison:  None.  Findings:  No acute fracture or traumatic malalignment. Cruciate ligament reconstruction anchors noted. No aggressive osseous lesions. Mild prepatellar soft tissue swelling and swelling along the distal thigh. No significant effusion..    No acute fracture or traumatic malalignment.    Signed (Electronic Signature): 01/21/2024 2:18 PM Signed By: Tommi Seaman, MD  XR Chest Portable Result Date: 01/21/2024 Exam:  Portable Chest  History:  Altered mental status  Technique:  Single frontal view.  Comparison:  CT chest abdomen pelvis 06/16/2016  Findings:   The heart size,  mediastinal contours, pleural surfaces and pulmonary vascularity are all normal. The lungs are clear. There is no detectable pleural effusion or pneumothorax. The regional bones are normal for age.    Negative portable chest xray exam.    Signed (Electronic Signature): 01/21/2024 12:39 PM Signed By: Luetta Ambrosia, MD  ECG 12 Lead Result Date: 01/21/2024 Sinus rhythm with 1st degree AV block Low voltage QRS Borderline ECG No previous ECGs available   Lab Results   Recent Labs    01/23/24 0403  WBC 12.0*  HGB 10.4*  HCT 31.5*  PLT 205   Recent Labs    01/21/24 1226 01/21/24 1450 01/23/24 0403  NA 125*   < >  133*  K 6.4*   < > 4.1  CL 88*   < > 99  CO2 23.9   < > 24.6  BUN 44*   < > 31*  CREATININE 2.73*   < > 1.43*  GLU 409*   < > 252*  CALCIUM 9.9   < > 8.5  ALBUMIN 4.5   < > 3.5  PROT 7.5   < > 6.2  BILITOT 0.5   < > 0.4  AST 32   < > 21  ALT 48   < > 38  ALKPHOS 88   < > 66  MG 1.8  --   --   LIPASE 40  --   --    < > = values in this interval not displayed.   Recent Labs    01/21/24 1226  TROPONINI 6  INR 1.08   Recent Labs    01/21/24 1333  WBCUA <1  NITRITE Negative  LEUKOCYTESUR Negative  BACTERIA None Seen  RBCUA 1  BLOODU Negative  GLUCOSEU >1000 mg/dL*  PROTEINUA Negative  KETONESU Negative   Recent Labs    01/21/24 1226 01/21/24 1333  OPIAU  --  Negative  BENZU  --  Negative  AMPHU  --  Negative  COCAU  --  Negative  CANNAU  --  Negative  BARBU  --  Negative  ETOH <3  --    No results for input(s): PREGTESTUR, PREGPOC in the last 72 hours. Recent Labs    01/22/24 0812  A1C 9.6*  CHOL 155  LDL 71  HDL 52  TRIG 220*   No results for input(s): O2SOUR, FIO2ART, PHART, PCO2ART, PO2ART, HCO3ART, O2SATART, BEART in the last 72 hours.   Home Medications   Prior to Admission medications  Medication Dose, Route, Frequency  buprenorphine-naloxone (SUBOXONE) 8-2 mg sublingual film 1 Film, Sublingual,  Daily (standard)  cyclobenzaprine  (FLEXERIL ) 10 MG tablet 1 tablet, Daily (standard)  DULoxetine  (CYMBALTA ) 30 MG capsule 30 mg, 3 times a day (standard)  lisinopril  (PRINIVIL ,ZESTRIL ) 10 MG tablet 10 mg, Daily (standard)  meloxicam  (MOBIC ) 15 MG tablet 1 tablet, Daily (standard)  metFORMIN  (GLUCOPHAGE ) 1000 MG tablet 1,000 mg  mirtazapine  (REMERON ) 15 MG tablet 15 mg  OZEMPIC  2 mg/dose (8 mg/3 mL) PnIj INJECT 2 MG UNDER THE SKIN WEEKLY  simvastatin  (ZOCOR ) 40 MG tablet 40 mg, Every evening  tizanidine  (ZANAFLEX ) 4 MG tablet 4 mg  ibuprofen  (MOTRIN ) 400 MG tablet 400 mg, Oral, Every 6 hours PRN  naproxen (NAPROSYN) 500 MG tablet 500 mg, Oral, 2 times a day PRN   Joana JONETTA Hurst, PA Hospitalist, [facility] 01/23/24, 4:00 PM       [1] Past Medical History: Diagnosis Date  . Diabetes mellitus (CMS-HCC)   [2] No past surgical history on file. [3] History reviewed. No pertinent family history. [4]  Current Facility-Administered Medications:  .  acetaminophen (TYLENOL) suppository 650 mg, 650 mg, Rectal, Q6H PRN, Panwala, Naitik Dhansukh, PA .  acetaminophen (TYLENOL) tablet 650 mg, 650 mg, Oral, Q6H PRN, Panwala, Naitik Dhansukh, PA .  atorvastatin (LIPITOR) tablet 80 mg, 80 mg, Oral, Daily, Panwala, Naitik Dhansukh, PA, 80 mg at 01/22/24 1748 .  buprenorphine HCL (SUBUTEX) tablet 8 mg, 8 mg, Sublingual, Daily, Panwala, Naitik Dhansukh, PA, 8 mg at 01/23/24 0809 .  dextrose (GLUTOSE) 40 % gel 15 g of dextrose, 15 g of dextrose, Oral, Q10 Min PRN, Panwala, Naitik Dhansukh, PA .  dextrose 50 % in water (D50W) 50 % solution 12.5 g,  12.5 g, Intravenous, Q15 Min PRN, Panwala, Naitik Dhansukh, PA .  DULoxetine  (CYMBALTA ) DR capsule 30 mg, 30 mg, Oral, TID, Panwala, Naitik Dhansukh, PA, 30 mg at 01/23/24 1410 .  glucagon  injection 1 mg, 1 mg, Intramuscular, Once PRN, Panwala, Naitik Dhansukh, PA .  guaiFENesin (ROBITUSSIN) oral syrup, 200 mg, Oral, Q4H PRN, Panwala, Naitik Dhansukh, PA .   heparin (porcine) 5,000 unit/mL injection 5,000 Units, 5,000 Units, Subcutaneous, Q8H SCH, Panwala, Naitik Dhansukh, PA, 5,000 Units at 01/23/24 0516 .  hydrALAZINE (APRESOLINE) injection 10 mg, 10 mg, Intravenous, Q6H PRN, Panwala, Naitik Dhansukh, PA, 10 mg at 01/23/24 1225 .  insulin  lispro (HumaLOG) injection CORRECTIONAL 0-20 Units, 0-20 Units, Subcutaneous, ACHS, Panwala, Naitik [place], [place], 4 Units at 01/23/24 1151 .  lisinopril  (PRINIVIL ,ZESTRIL ) tablet 10 mg, 10 mg, Oral, Daily, Panwala, Naitik Dhansukh, PA, 10 mg at 01/23/24 0809 .  mirtazapine  (REMERON ) tablet 15 mg, 15 mg, Oral, Nightly, Panwala, Naitik Dhansukh, PA, 15 mg at 01/22/24 2120 .  naproxen (NAPROSYN) tablet 500 mg, 500 mg, Oral, BID PRN, Panwala, Naitik Dhansukh, PA .  ondansetron (ZOFRAN) injection 4 mg, 4 mg, Intravenous, Q6H PRN, Panwala, Naitik Dhansukh, PA, 4 mg at 01/22/24 1016 .  phenol (CHLORASEPTIC) 1.4 % spray 2 spray, 2 spray, Mucous Membrane, Q2H PRN, Panwala, Naitik Dhansukh, PA .  potassium chloride ER tablet 20 mEq, 20 mEq, Oral, Daily, Panwala, Naitik Dhansukh, PA, 20 mEq at 01/23/24 0907 .  sodium chloride  (NS) 0.9 % infusion, 125 mL/hr, Intravenous, Continuous, Panwala, Naitik Dhansukh, PA, Stopped at 01/23/24 1344 .  sodium chloride  soluble tablet 2 g, 2 g, Oral, 3xd Meals, Panwala, Naitik Dhansukh, PA, 2 g at 01/23/24 1232 .  tizanidine  (ZANAFLEX ) tablet 4 mg, 4 mg, Oral, TID, Panwala, Naitik Dhansukh, PA, 4 mg at 01/23/24 1410

## 2024-01-23 NOTE — Nursing Note (Signed)
 Discharged to home. IV removed, Discharged order placed for patient , discharge instructions were given, Transport via w/c to private. Patient left stable.

## 2024-01-24 ENCOUNTER — Telehealth: Payer: Self-pay

## 2024-01-24 NOTE — Telephone Encounter (Signed)
 Copied from CRM [id number]. Topic: Clinical - Prescription Issue >> Jan 24, 2024 11:01 AM Leonette SQUIBB wrote: Reason for CRM: Patient would like like someone to call him back about medication mix up that caused him to go to the hospital.  [phone]

## 2024-01-25 ENCOUNTER — Telehealth: Payer: Self-pay | Admitting: *Deleted

## 2024-01-25 NOTE — Transitions of Care (Post Inpatient/ED Visit) (Signed)
   01/25/2024  Name: CLIFORD SEQUEIRA MRN: [id number] DOB: [birth year]  Today's TOC FU Call Status: Today's TOC FU Call Status:: Unsuccessful Call (1st Attempt) Unsuccessful Call (1st Attempt) Date: 01/25/24  Attempted to reach the patient regarding the most recent Inpatient/ED visit.  Follow Up Plan: Additional outreach attempts will be made to reach the patient to complete the Transitions of Care (Post Inpatient/ED visit) call.   Cathlean Headland BSN RN [place] [facility] Health Care Management Coordinator [email or web address] Direct Dial: [phone]  Fax: [phone] Website: [email or web address]

## 2024-01-25 NOTE — Telephone Encounter (Signed)
 Left pt a message to call back.

## 2024-01-26 ENCOUNTER — Telehealth: Payer: Self-pay

## 2024-01-26 NOTE — Care Plan (Signed)
 Transition of Care Encounter Data   Call attempt: 2 Admission date: 01/21/24 Discharge date: 01/23/24 Discharge diagnosis: Acute kidney injury Do you have a hospital follow up appointment?: No - None Scheduled Comments (None): Patient reports he has attempted to schedule HFU appointment with PCP clinic with no success. Will assign task.  Patient post discharge: Since your discharge, are your symptoms better, worse, or the same?: Same Have you developed any new symptoms?: Yes Symptoms: Patient reports that his hearing has worsened in his left ear since his discharge. Patient reports that his right leg is bruised and he is having a hard time lifting that affected leg.  Is there someone to help you at home?: Yes  Are there questions we can help clarify before your next appointment?: No Medications:      Were you able to pick up all of your newly prescribed medications (and/or any necessary refills)?:  (Comment: No medications prescribed at discharge.)  Were medications prescribed or ordered upon discharge reviewed today with the most recent outpatient medication list?: Yes (Comment: No medications prescribed at discharge.) If Home Health Services or Medical Equipment was ordered, has it arrived?: N/A (Comment: No referral placed. Patient reports he woudl like PT.) Remind Patients: If patient has a non-emergent medical problem, they may contact a nurse 24/7 or patient may call their provider's clinic. If experiencing a medical emergency, patient should call 911: Yes .   [facility]: [phone]:  .  [facility]: [phone]:  .  Other: Contact PCP:     High priority, please respond within 2 business days.   I am reaching out to [employer] as part of the transitional case management team regarding his recent hospital discharge.  Patient reports that his hearing has worsened in his left ear since his discharge. Patient reports that his right leg is bruised and he is having a hard time lifting that  affected leg.   Action item: make recommendations and contact patient directly  Please advise for additional recommendations.  Next appointment with PCP: Unknown. Patient reports difficulty with scheduling appointment, but has attempted. Care coordination task assigned to [facility] by TCM CM . Requested Services: Hospital follow-up appointment request  . Patient prefers an appointment before noon and needs atleast 1 day notice.   See chart for med list if needed  Monita KATHEE Shoulder, RN        Monita KATHEE Shoulder, RN

## 2024-01-26 NOTE — Patient Instructions (Signed)
 Visit Information  Thank you for taking time to visit with me today. Please don't hesitate to contact me if I can be of assistance to you before our next scheduled telephone appointment.  Our next appointment is by telephone on Wednesday December 10th at 2:00pm  Following is a copy of your care plan:   Goals Addressed             This Visit's Progress    VBCI Transitions of Care (TOC) Care Plan       Problems:  Recent Hospitalization for treatment of DMII No Hospital Follow Up Provider appointment The PCP office does not have any openings. CM to contact the provider  Goal:  Over the next 30 days, the patient will not experience hospital readmission  Interventions:   Diabetes Interventions: Assessed patient's understanding of A1c goal: <7%  Lab Results  Component Value Date   HGBA1C 9.3 (H) 12/01/2023  Check feet weekly for redness, open wounds Diabetic eye exam weekly A1c every 3 months  Patient Self Care Activities:  Attend all scheduled provider appointments Call pharmacy for medication refills 3-7 days in advance of running out of medications Call provider office for new concerns or questions  Notify RN Care Manager of [facility] call rescheduling needs Participate in Transition of Care Program/Attend [facility] scheduled calls Perform all self care activities independently  Take medications as prescribed    Plan:  Telephone follow up appointment with care management team member scheduled for:  Wednesday December 10th at 2:00pm        Patient verbalizes understanding of instructions and care plan provided today and agrees to view in MyChart. Active MyChart status and patient understanding of how to access instructions and care plan via MyChart confirmed with patient.     The patient has been provided with contact information for the care management team and has been advised to call with any health related questions or concerns.   Please call the care guide team at  [phone] if you need to cancel or reschedule your appointment.   Please call the Suicide and Crisis Lifeline: 988 call the [place]  National Suicide Prevention Lifeline: 7033541973 or TTY: 9373596379 TTY (985) 382-2319) to talk to a trained counselor if you are experiencing a Mental Health or Behavioral Health Crisis or need someone to talk to.  Medford Balboa, BSN, RN [place]  VBCI - [employer] [facility] RN Care Manager [phone]

## 2024-01-26 NOTE — Transitions of Care (Post Inpatient/ED Visit) (Signed)
 01/26/2024  Name: Thomas Rollins MRN: [id number] DOB: 1960-03-13  Today's TOC FU Call Status: Today's TOC FU Call Status:: Successful TOC FU Call Completed TOC FU Call Complete Date: 01/26/24  Patient's Name and Date of Birth confirmed. Name, DOB  Transition Care Management Follow-up Telephone Call Date of Discharge: 01/23/24 Discharge Facility: Other [facility]) Name of Other ([facility]) Discharge Facility: [facility] Type of Discharge: Inpatient Admission Primary Inpatient Discharge Diagnosis:: Slurred speech numbness and tingling How have you been since you were released from the hospital?: Better Any questions or concerns?: No  Items Reviewed: Did you receive and understand the discharge instructions provided?: Yes Medications obtained,verified, and reconciled?: Yes (Medications Reviewed) Any new allergies since your discharge?: No Dietary orders reviewed?: Yes Type of Diet Ordered:: Diabetic Do you have support at home?: Yes People in Home [RPT]: friend(s) Name of Support/Comfort Primary Source: Morton Chard  Medications Reviewed Today: Medications Reviewed Today     Reviewed by Moises Reusing, RN (Case Manager) on 01/26/24 at 1552  Med List Status: <None>   Medication Order Taking? Sig Documenting Provider Last Dose Status Informant  benzonatate  (TESSALON ) 200 MG capsule [id number]  Take 1 capsule (200 mg total) by mouth 2 (two) times daily as needed for cough.  Patient not taking: Reported on 01/26/2024   Almeda Loa ORN, FNP  Active   blood glucose meter kit and supplies [id number]  Dispense based on patient and insurance preference. Use up to four times daily as directed. (FOR ICD-10 E10.9, E11.9). Elnor Fairy HERO, NP  Active Self  Buprenorphine HCl-Naloxone HCl 8-2 MG FILM [id number]  Place 8 mg of opioid under the tongue every 12 (twelve) hours. [provider]  Active   cyclobenzaprine  (FLEXERIL ) 10 MG tablet [id number]  Take 1 tablet (10 mg  total) by mouth 2 (two) times daily as needed.  Patient not taking: Reported on 01/26/2024   Melvenia Manus BRAVO, MD  Active   Diclofenac  Sodium 3 % GEL [id number]  apply 1 gram TOPICALLY FOUR TIMES DAILY AS NEEDED FOR PAIN Melvenia Manus BRAVO, MD  Active   DULoxetine  (CYMBALTA ) 30 MG capsule [id number]  TAKE ONE CAPSULE BY MOUTH THREE TIMES DAILY Del Orbe Polanco, Iliana, FNP  Active   gabapentin  (NEURONTIN ) 300 MG capsule [id number]  TAKE ONE CAPSULE BY MOUTH TWICE DAILY (ONLY taking one daily) Melvenia Manus BRAVO, MD  Active   GLOBAL EASE INJECT PEN NEEDLES 31G X 8 MM MISC [id number]  Use to inject insulin  once daily Elnor Fairy HERO, NP  Active   Glucagon  (GVOKE HYPOPEN  2-PACK) 1 MG/0.2ML EMMANUEL [id number]  Inject 1 mg into the skin daily as needed. Paseda, Folashade R, FNP  Active   levothyroxine  (SYNTHROID ) 100 MCG tablet [id number]  Take 1 tablet (100 mcg total) by mouth daily.  Patient not taking: Reported on 01/26/2024   Paseda, Folashade R, FNP  Active   lisinopril  (ZESTRIL ) 10 MG tablet [id number]  TAKE 1 TABLET BY MOUTH DAILY Dixon, Phillip E, MD  Active   meloxicam  (MOBIC ) 15 MG tablet [id number]  Take 15 mg by mouth daily.  Patient not taking: Reported on 01/26/2024   [provider]  Active   metFORMIN  (GLUCOPHAGE ) 1000 MG tablet [id number]  TAKE 1 TABLET BY MOUTH TWICE DAILY Dixon, Phillip E, MD  Active   metoCLOPramide  (REGLAN ) 5 MG tablet [id number]  TAKE 1 TABLET BY MOUTH EVERY 8 HOURS AS NEEDED FOR NAUSEA OR FOR VOMITING  Patient not taking: Reported on 01/26/2024   Melvenia Manus  E, MD  Active   mirtazapine  (REMERON ) 15 MG tablet [id number]  Take 1 tablet (15 mg total) by mouth at bedtime. Del Wilhelmena Falter, Benedict, FNP  Active   naloxone [facility]) nasal spray 4 mg/0.1 mL [id number]  Place 1 spray into the nose as needed (opioid overdose). [provider]  Active Self  AISHA FLING test strip [id number]  USE UP TO FOUR TIMES DAILY AS DIRECTED (25 DAY supply) Elnor Fairy HERO, NP  Active  Self  OZEMPIC , 2 MG/DOSE, 8 MG/3ML SOPN [id number]  INJECT 2 MG UNDER THE SKIN WEEKLY Bevely Doffing, FNP  Active   simvastatin  (ZOCOR ) 40 MG tablet [id number]  TAKE 1 TABLET BY MOUTH EVERY EVENING Oley Bascom RAMAN, NP  Active   tiZANidine  (ZANAFLEX ) 4 MG tablet [id number]  Take 1 tablet (4 mg total) by mouth every 8 (eight) hours as needed for muscle spasms. Bevely Doffing, FNP  Active   triamcinolone  cream (KENALOG ) 0.1 % [id number]  Apply 1 Application topically 2 (two) times daily. Melvenia Manus BRAVO, MD  Active             Home Care and Equipment/Supplies: Were Home Health Services Ordered?: No Any new equipment or medical supplies ordered?: No  Functional Questionnaire: Do you need assistance with bathing/showering or dressing?: No Do you need assistance with meal preparation?: No Do you need assistance with eating?: No Do you have difficulty maintaining continence: No Do you need assistance with getting out of bed/getting out of a chair/moving?: No Do you have difficulty managing or taking your medications?: No  Follow up appointments reviewed: PCP Follow-up appointment confirmed?: No (The Care Guide states there are no available appointments through January. The schedules are blocked) MD Provider Line Number:[phone] Given: No Specialist Hospital Follow-up appointment confirmed?: NA Do you need transportation to your follow-up appointment?: No Do you understand care options if your condition(s) worsen?: Yes-patient verbalized understanding  SDOH Interventions Today    Flowsheet Row Most Recent Value  SDOH Interventions   Food Insecurity Interventions Intervention Not Indicated  Housing Interventions Intervention Not Indicated  Transportation Interventions Intervention Not Indicated  Utilities Interventions Intervention Not Indicated    Goals Addressed             This Visit's Progress    VBCI Transitions of Care (TOC) Care Plan       Problems:  Recent  Hospitalization for treatment of DMII No Hospital Follow Up Provider appointment The PCP office does not have any openings. CM to contact the provider  Goal:  Over the next 30 days, the patient will not experience hospital readmission  Interventions:   Diabetes Interventions: Assessed patient's understanding of A1c goal: <7%  Lab Results  Component Value Date   HGBA1C 9.3 (H) 12/01/2023  Check feet weekly for redness, open wounds Diabetic eye exam weekly A1c every 3 months  Patient Self Care Activities:  Attend all scheduled provider appointments Call pharmacy for medication refills 3-7 days in advance of running out of medications Call provider office for new concerns or questions  Notify RN Care Manager of [facility] call rescheduling needs Participate in Transition of Care Program/Attend [facility] scheduled calls Perform all self care activities independently  Take medications as prescribed    Plan:  Telephone follow up appointment with care management team member scheduled for:  Wednesday December 10th at 2:00pm        Medford Balboa, BSN, RN [place]  VBCI - [employer] [facility] RN Care Manager [phone]

## 2024-02-02 ENCOUNTER — Other Ambulatory Visit: Payer: Self-pay

## 2024-02-02 ENCOUNTER — Telehealth: Payer: Self-pay | Admitting: Family Medicine

## 2024-02-02 NOTE — Transitions of Care (Post Inpatient/ED Visit) (Signed)
°  Transition of Care week 2  Visit Note  02/02/2024  Name: Thomas Rollins MRN: [id number]          DOB: [birth year]  Situation: Patient enrolled in [facility] 30-day program. Visit completed with Alixander [place] by telephone.   Background:   Initial Transition Care Management Follow-up Telephone Call Discharge Date and Diagnosis: 01/23/24, Slurred speech numbness and tingling   Past Medical History:  Diagnosis Date   Abnormal LFTs    Abnormal LFTs    Anxiety    panic attacks   Chronic pain    Depression    Diabetes mellitus without complication (HCC)    Encounter for hepatitis C screening test for low risk patient 04/05/2019   Encounter for screening colonoscopy    Hyperlipidemia    Hypertension    Hypothyroidism    Sleep apnea    TIA (transient ischemic attack) 2019    Assessment: Patient Reported Symptoms: Cognitive Cognitive Status: Alert and oriented to person, place, and time, Struggling with memory recall, Requires Assistance Decision Making Cognitive/Intellectual Conditions Management [RPT]: Brain Injury Brain Injury: CVA      Neurological Neurological Review of Symptoms: Not assessed    HEENT HEENT Symptoms Reported: Not assessed      Cardiovascular Cardiovascular Symptoms Reported: Not assessed    Respiratory Respiratory Symptoms Reported: Not assesed    Endocrine Endocrine Symptoms Reported: Not assessed    Gastrointestinal Gastrointestinal Symptoms Reported: Not assessed      Genitourinary Genitourinary Symptoms Reported: Not assessed    Integumentary Integumentary Symptoms Reported: Not assessed    Musculoskeletal Musculoskelatal Symptoms Reviewed: Not assessed        Psychosocial Psychosocial Symptoms Reported: Anxiety - if selected complete GAD, Difficulty concentrating, Irritability         There were no vitals filed for this visit.    Medications Reviewed Today   Medications were not reviewed in this encounter     Recommendation:   PCP  Follow-up CM attempted Follow up phone call. The patient is with high anxiety because he is insistent that today's call is a video call and he doesn't have a link. He is not comprehending that the scheduled visit at 2pm is with the [facility] RN and it is a telephone. The patient became tearful and panicky about his medications and wants to review them with his provider. CM placed a phone call to the provider's office for a scheduled hospital follow up. CM was told there are not appointments available until January 27th. The nurse is going to have to talk with the provider and see if he can be scheduled in. CM called the patient back twice and he did not answer the phone.   Follow Up Plan:   Telephone follow-up in 1 week  Medford Balboa, BSN, RN [place]  VBCI - [facility] RN Care Manager [phone]

## 2024-02-02 NOTE — Telephone Encounter (Signed)
 Appt scheduled

## 2024-02-02 NOTE — Patient Instructions (Signed)
 Visit Information  Thank you for taking time to visit with me today. Please don't hesitate to contact me if I can be of assistance to you before our next scheduled telephone appointment.  Following is a copy of your care plan:   Goals Addressed             This Visit's Progress    VBCI Transitions of Care (TOC) Care Plan       Problems:  Recent Hospitalization for treatment of DMII No Hospital Follow Up Provider appointment The PCP office does not have any openings. CM to contact the provider  Goal:  Over the next 30 days, the patient will not experience hospital readmission  Interventions:   Diabetes Interventions: Assessed patient's understanding of A1c goal: <7%  Lab Results  Component Value Date   HGBA1C 9.3 (H) 12/01/2023  Check feet weekly for redness, open wounds Diabetic eye exam weekly A1c every 3 months  Patient Self Care Activities:  Attend all scheduled provider appointments Call pharmacy for medication refills 3-7 days in advance of running out of medications Call provider office for new concerns or questions  Notify RN Care Manager of TOC call rescheduling needs Participate in Transition of Care Program/Attend TOC scheduled calls Perform all self care activities independently  Take medications as prescribed    Plan:  Telephone follow up appointment with care management team member scheduled for:  Unable to schedule a follow up appointment at this time. The patient is distressed about so many people calling him. CM to follow up next week and then discharge from the program if the patient remains with high anxiety        Patient verbalizes understanding of instructions and care plan provided today and agrees to view in MyChart. Active MyChart status and patient understanding of how to access instructions and care plan via MyChart confirmed with patient.     The patient has been provided with contact information for the care management team and has been  advised to call with any health related questions or concerns.   Please call the care guide team at [phone] if you need to cancel or reschedule your appointment.   Please call the Suicide and Crisis Lifeline: 988 call the [place]  National Suicide Prevention Lifeline: 385-119-3256 or TTY: 6366145691 TTY (434)398-4861) to talk to a trained counselor if you are experiencing a Mental Health or Behavioral Health Crisis or need someone to talk to.  Medford Balboa, BSN, RN [place]  VBCI - [employer] [facility] RN Care Manager [phone]

## 2024-02-02 NOTE — Telephone Encounter (Signed)
 Copied from CRM #[id number]. Topic: Appointments - Appointment Scheduling >> Feb 02, 2024  2:14 PM Victoria B wrote: Medford from [facility] hospital called for hosp fu for patient, nothing shows until January and beyond, needs sooner appt. Please cb

## 2024-02-07 ENCOUNTER — Telehealth (INDEPENDENT_AMBULATORY_CARE_PROVIDER_SITE_OTHER): Admitting: Family Medicine

## 2024-02-07 DIAGNOSIS — R296 Repeated falls: Secondary | ICD-10-CM | POA: Diagnosis not present

## 2024-02-07 DIAGNOSIS — N179 Acute kidney failure, unspecified: Secondary | ICD-10-CM | POA: Insufficient documentation

## 2024-02-07 NOTE — Assessment & Plan Note (Signed)
 A referral for a life alert device will be placed to support safety at home given his recent falls. Strongly encouraged the continued use of a walker to prevent further injury. Reviewed fall precautions, including avoiding bending suddenly, keeping walkways clear, and rising slowly from seated positions. Monitor facial bruising and right-hand discomfort from the fall; apply ice for swelling and use warm compresses  if soreness persists. Report any worsening pain, numbness, weakness, or new symptoms.

## 2024-02-07 NOTE — Assessment & Plan Note (Addendum)
 Labs and imaging studies reviewed -Continue to avoid Flexeril , meloxicam , naproxen, and Motrin , as instructed at discharge, given recent AKI and risk of worsening kidney function. -Reinforce strict adherence to all current prescribed medications for blood pressure, diabetes, and other chronic conditions. -Will repeat a BMP (Basic Metabolic Panel) to reassess sodium, potassium, creatinine, and glucose levels, given recent AKI and electrolyte abnormalities. -Encouraged adequate hydration as tolerated, unless otherwise restricted by nephrology or cardiology.

## 2024-02-07 NOTE — Progress Notes (Signed)
 Virtual Visit via Video Note  I connected with Thomas Rollins on 02/07/2024 at  3:00 PM EST by a video enabled telemedicine application and verified that I am speaking with the correct person using two identifiers.  Patient Location: Home Provider Location: Home Office  I discussed the limitations, risks, security, and privacy concerns of performing an evaluation and management service by video and the availability of in person appointments. I also discussed with the patient that there may be a patient responsible charge related to this service. The patient expressed understanding and agreed to proceed.  Subjective: PCP: Terry Wilhelmena Lloyd Hilario, FNP  Chief Complaint  Patient presents with   Hospitalization Follow-up   HPI The patient is seen today for hospital follow-up after admission for acute kidney injury (AKI) and electrolyte abnormalities. He initially presented to the ER with sodium of 125, potassium of 6.4, glucose of 409, and creatinine of 2.73 (baseline ~1.1). Labs also showed low chloride (88), elevated BUN (44), high anion gap (13), and negative troponin. After IV fluids, repeat BMP showed improvement: sodium 129, potassium 5.0, chloride 92, creatinine 2.58, glucose 270, BUN 42, and anion gap 12. Alcohol, magnesium, lipase, and drug screen were all unremarkable. Urinalysis showed glucosuria >1000 but was otherwise negative.  He was admitted for further management of AKI, electrolyte abnormalities, hyperglycemia, muscle spasms, and slurred speech.  Since discharge, he reports a recent fall on 02/04/2024. While getting out of his car at his pain management appointment, he bent down to pick up dropped medication and fell forward, landing face-down. He denies loss of consciousness but has bruising on the right side of the nose, chin, and right hand. He is currently using a walker for stability. He states he stopped taking Flexeril , meloxicam , naproxen, and Motrin . He also contacted  his insurance regarding obtaining a Life Alert bracelet and was advised to request a referral from his PCP.   ROS: Per HPI Current Medications[1]  Observations/Objective: There were no vitals filed for this visit. Physical Exam  No labored breathing.  Speech is clear and coherent with logical content.  Patient is alert and oriented at baseline.   Assessment and Plan: Acute kidney injury Assessment & Plan: Labs and imaging studies reviewed -Continue to avoid Flexeril , meloxicam , naproxen, and Motrin , as instructed at discharge, given recent AKI and risk of worsening kidney function. -Reinforce strict adherence to all current prescribed medications for blood pressure, diabetes, and other chronic conditions. -Will repeat a BMP (Basic Metabolic Panel) to reassess sodium, potassium, creatinine, and glucose levels, given recent AKI and electrolyte abnormalities. -Encouraged adequate hydration as tolerated, unless otherwise restricted by nephrology or cardiology.   Orders: -     CBC with Differential/Platelet -     BMP8+EGFR  Frequent falls Assessment & Plan: A referral for a life alert device will be placed to support safety at home given his recent falls. Strongly encouraged the continued use of a walker to prevent further injury. Reviewed fall precautions, including avoiding bending suddenly, keeping walkways clear, and rising slowly from seated positions. Monitor facial bruising and right-hand discomfort from the fall; apply ice for swelling and use warm compresses  if soreness persists. Report any worsening pain, numbness, weakness, or new symptoms.   Orders: -     AMB Referral VBCI Care Management -     Ambulatory referral to Physical Therapy  Note: This chart has been completed using [employer] (consulting) software, and while attempts have been made to ensure accuracy, certain words and  phrases may not be transcribed as intended.    Follow Up Instructions: No follow-ups on  file.   I discussed the assessment and treatment plan with the patient. The patient was provided an opportunity to ask questions, and all were answered. The patient agreed with the plan and demonstrated an understanding of the instructions.   The patient was advised to call back or seek an in-person evaluation if the symptoms worsen or if the condition fails to improve as anticipated.  The above assessment and management plan was discussed with the patient. The patient verbalized understanding of and has agreed to the management plan.   [name], FNP     [1]  Current Outpatient Medications:    benzonatate  (TESSALON ) 200 MG capsule, Take 1 capsule (200 mg total) by mouth 2 (two) times daily as needed for cough. (Patient not taking: Reported on 01/26/2024), Disp: 20 capsule, Rfl: 0   blood glucose meter kit and supplies, Dispense based on patient and insurance preference. Use up to four times daily as directed. (FOR ICD-10 E10.9, E11.9)., Disp: 1 each, Rfl: 0   Buprenorphine HCl-Naloxone HCl 8-2 MG FILM, Place 8 mg of opioid under the tongue every 12 (twelve) hours., Disp: , Rfl:    Diclofenac  Sodium 3 % GEL, apply 1 gram TOPICALLY FOUR TIMES DAILY AS NEEDED FOR PAIN, Disp: 100 g, Rfl: 1   DULoxetine  (CYMBALTA ) 30 MG capsule, TAKE ONE CAPSULE BY MOUTH THREE TIMES DAILY, Disp: 270 capsule, Rfl: 0   gabapentin  (NEURONTIN ) 300 MG capsule, TAKE ONE CAPSULE BY MOUTH TWICE DAILY (ONLY taking one daily), Disp: 60 capsule, Rfl: 6   GLOBAL EASE INJECT PEN NEEDLES 31G X 8 MM MISC, Use to inject insulin  once daily, Disp: 100 each, Rfl: 3   Glucagon  (GVOKE HYPOPEN  2-PACK) 1 MG/0.2ML SOAJ, Inject 1 mg into the skin daily as needed., Disp: 0.4 mL, Rfl: 0   levothyroxine  (SYNTHROID ) 100 MCG tablet, Take 1 tablet (100 mcg total) by mouth daily. (Patient not taking: Reported on 01/26/2024), Disp: 90 tablet, Rfl: 0   lisinopril  (ZESTRIL ) 10 MG tablet, TAKE 1 TABLET BY MOUTH DAILY, Disp: 90 tablet, Rfl: 3    meloxicam  (MOBIC ) 15 MG tablet, Take 15 mg by mouth daily. (Patient not taking: Reported on 01/26/2024), Disp: , Rfl:    metFORMIN  (GLUCOPHAGE ) 1000 MG tablet, TAKE 1 TABLET BY MOUTH TWICE DAILY, Disp: 180 tablet, Rfl: 3   metoCLOPramide  (REGLAN ) 5 MG tablet, TAKE 1 TABLET BY MOUTH EVERY 8 HOURS AS NEEDED FOR NAUSEA OR FOR VOMITING (Patient not taking: Reported on 01/26/2024), Disp: 20 tablet, Rfl: 1   mirtazapine  (REMERON ) 15 MG tablet, Take 1 tablet (15 mg total) by mouth at bedtime., Disp: 90 tablet, Rfl: 1   naloxone (NARCAN) nasal spray 4 mg/0.1 mL, Place 1 spray into the nose as needed (opioid overdose)., Disp: , Rfl:    ONETOUCH ULTRA test strip, USE UP TO FOUR TIMES DAILY AS DIRECTED (25 DAY supply), Disp: 100 strip, Rfl: 11   OZEMPIC , 2 MG/DOSE, 8 MG/3ML SOPN, INJECT 2 MG UNDER THE SKIN WEEKLY, Disp: 6 mL, Rfl: 2   simvastatin  (ZOCOR ) 40 MG tablet, TAKE 1 TABLET BY MOUTH EVERY EVENING, Disp: 100 tablet, Rfl: 3   tiZANidine  (ZANAFLEX ) 4 MG tablet, Take 1 tablet (4 mg total) by mouth every 8 (eight) hours as needed for muscle spasms., Disp: 30 tablet, Rfl: 1   triamcinolone  cream (KENALOG ) 0.1 %, Apply 1 Application topically 2 (two) times daily., Disp: 30 g,  Rfl: 0

## 2024-02-08 DIAGNOSIS — N179 Acute kidney failure, unspecified: Secondary | ICD-10-CM | POA: Diagnosis not present

## 2024-02-09 LAB — CBC WITH DIFFERENTIAL/PLATELET
Basophils Absolute: 0.1 x10E3/uL (ref 0.0–0.2)
Basos: 1 %
EOS (ABSOLUTE): 0.5 x10E3/uL — ABNORMAL HIGH (ref 0.0–0.4)
Eos: 4 %
Hematocrit: 38.5 % (ref 37.5–51.0)
Hemoglobin: 12.2 g/dL — ABNORMAL LOW (ref 13.0–17.7)
Immature Grans (Abs): 0 x10E3/uL (ref 0.0–0.1)
Immature Granulocytes: 0 %
Lymphocytes Absolute: 2.2 x10E3/uL (ref 0.7–3.1)
Lymphs: 19 %
MCH: 27.9 pg (ref 26.6–33.0)
MCHC: 31.7 g/dL (ref 31.5–35.7)
MCV: 88 fL (ref 79–97)
Monocytes Absolute: 0.7 x10E3/uL (ref 0.1–0.9)
Monocytes: 6 %
Neutrophils Absolute: 7.8 x10E3/uL — ABNORMAL HIGH (ref 1.4–7.0)
Neutrophils: 70 %
Platelets: 247 x10E3/uL (ref 150–450)
RBC: 4.37 x10E6/uL (ref 4.14–5.80)
RDW: 15.1 % (ref 11.6–15.4)
WBC: 11.2 x10E3/uL — ABNORMAL HIGH (ref 3.4–10.8)

## 2024-02-09 LAB — BMP8+EGFR
BUN/Creatinine Ratio: 19 (ref 10–24)
BUN: 20 mg/dL (ref 8–27)
CO2: 20 mmol/L (ref 20–29)
Calcium: 10.7 mg/dL — ABNORMAL HIGH (ref 8.6–10.2)
Chloride: 91 mmol/L — ABNORMAL LOW (ref 96–106)
Creatinine, Ser: 1.08 mg/dL (ref 0.76–1.27)
Glucose: 357 mg/dL — ABNORMAL HIGH (ref 70–99)
Potassium: 5.2 mmol/L (ref 3.5–5.2)
Sodium: 131 mmol/L — ABNORMAL LOW (ref 134–144)
eGFR: 76 mL/min/1.73 (ref >59)

## 2024-02-10 ENCOUNTER — Other Ambulatory Visit: Payer: Self-pay | Admitting: Internal Medicine

## 2024-02-10 ENCOUNTER — Ambulatory Visit: Payer: Self-pay | Admitting: Family Medicine

## 2024-02-10 DIAGNOSIS — E1165 Type 2 diabetes mellitus with hyperglycemia: Secondary | ICD-10-CM

## 2024-02-11 ENCOUNTER — Telehealth: Payer: Self-pay | Admitting: *Deleted

## 2024-02-11 NOTE — Progress Notes (Unsigned)
 Complex Care Management Note Care Guide Note  02/11/2024 Name: Thomas Rollins MRN: [id number] DOB: 1961-01-22   Complex Care Management Outreach Attempts: An unsuccessful telephone outreach was attempted today to offer the patient information about available complex care management services.  Follow Up Plan:  Additional outreach attempts will be made to offer the patient complex care management information and services.   Encounter Outcome:  No Answer  Thedford Franks, CMA [place]  [facility], [facility] Guide Direct Dial: [phone]  Fax: [phone] Website: [email or web address]

## 2024-02-16 ENCOUNTER — Other Ambulatory Visit: Payer: Self-pay

## 2024-02-16 DIAGNOSIS — R252 Cramp and spasm: Secondary | ICD-10-CM

## 2024-02-18 ENCOUNTER — Other Ambulatory Visit: Payer: Self-pay | Admitting: Internal Medicine

## 2024-02-20 DIAGNOSIS — I10 Essential (primary) hypertension: Secondary | ICD-10-CM | POA: Diagnosis not present

## 2024-02-20 DIAGNOSIS — E1140 Type 2 diabetes mellitus with diabetic neuropathy, unspecified: Secondary | ICD-10-CM | POA: Diagnosis not present

## 2024-02-21 NOTE — Progress Notes (Signed)
 Complex Care Management Note  Care Guide Note 02/21/2024 Name: Thomas Rollins MRN: [id number] DOB: Dec 24, 1960  Thomas Rollins is a 63 y.o. year old male who sees Del Wilhelmena Falter, [place], FNP for primary care. I reached out to [name] by phone today to offer complex care management services.  Thomas Rollins was given information about Complex Care Management services today including:   The Complex Care Management services include support from the care team which includes your Nurse Care Manager, Clinical Social Worker, or Pharmacist.  The Complex Care Management team is here to help remove barriers to the health concerns and goals most important to you. Complex Care Management services are voluntary, and the patient may decline or stop services at any time by request to their care team member.   Complex Care Management Consent Status: Patient agreed to services and verbal consent obtained.   Follow up plan:  Telephone appointment with complex care management team member scheduled for:  02/22/2024  Encounter Outcome:  Patient Scheduled  Thedford Franks, CMA [place]  [facility], [facility] Guide Direct Dial: [phone]  Fax: [phone] Website: [email or web address]

## 2024-02-22 ENCOUNTER — Other Ambulatory Visit: Payer: Self-pay

## 2024-02-22 NOTE — Patient Instructions (Signed)
 Visit Information  Thank you for taking time to visit with me today. Please don't hesitate to contact me if I can be of assistance to you before our next scheduled appointment.    Following is a copy of your care plan:   Goals Addressed             This Visit's Progress    BSW Goals       Current SDOH Barriers:  Financial constraints related to affordable life alert system and shower grab bars.  Interventions: Patient interviewed and appropriate screenings performed Referred patient to community resources  Provided patient with information about [place] personal monitoring program Advised patient to contact [employer] for discounted grab bars. Provided education on Advanced Directives Patient has a safety plan in place since he can't afford the monthly fee for and alert system. Patient reports no additional unmet and does not want follow up.          Please call 911 if you are experiencing a Mental Health or Behavioral Health Crisis or need someone to talk to.  Patient verbalized understanding of Care plan and visit instructions communicated this visit  Tillman Gardener, BSW [place]  [facility], [facility] Social Worker Direct Dial: [phone]  Fax: [phone] Website: [email or web address]

## 2024-02-22 NOTE — Patient Outreach (Signed)
 Social Drivers of Health  Community Resource and Care Coordination Visit Note   02/22/2024  Name: Thomas Rollins MRN: [id number] DOB:09-11-1960  Situation: Referral received for [facility] needs assessment and assistance related to Financial Strain . I obtained verbal consent from Patient.  Visit completed with Patient on the phone.   Background:   SDOH Interventions Today    Flowsheet Row Most Recent Value  SDOH Interventions   Housing Interventions Other (Comment)  [Pt wants grab bars in his bathroom. SW provided [employer] for discounted equipment.]  Financial Strain Interventions Other (Comment)  [Pt desires a personal monitoring system but can't afford monthly fee. Pt has a safety plan and keeps his phone with him at all times and has a bull horn in his room because the roomate is hard of hearing.]     Assessment:   Goals Addressed             This Visit's Progress    BSW Goals       Current SDOH Barriers:  Financial constraints related to affordable life alert system and shower grab bars.  Interventions: Patient interviewed and appropriate screenings performed Referred patient to community resources  Provided patient with information about [place] personal monitoring program Advised patient to contact [employer] for discounted grab bars. Provided education on Advanced Directives Patient has a safety plan in place since he can't afford the monthly fee for and alert system. Patient reports no additional unmet and does not want follow up.          Recommendation:   Patient will contact [employer] for DME.  Follow Up Plan:   Patient declines further calls or assistance. [employer] will be closed. Patient has been provided contact information should new needs arise.   Tillman Gardener, BSW [place]  [facility], [facility] Social Worker Direct Dial: [phone]  Fax: [phone] Website:  [email or web address]

## 2024-02-23 ENCOUNTER — Ambulatory Visit: Admitting: Physical Therapy

## 2024-03-02 ENCOUNTER — Telehealth: Payer: Self-pay | Admitting: Family Medicine

## 2024-03-02 NOTE — Telephone Encounter (Unsigned)
 Copied from CRM #[id number]. Topic: Referral - Request for Referral >> Mar 02, 2024  3:12 PM Lauren C wrote: Did the patient discuss referral with their provider in the last year? Yes- pt was referred to pain med by us  before and his insurance is no  longer in  network with them.  Appointment offered? No  Type of order/referral and detailed reason for visit: Pt needing continued pain management now that insurance is OON with current location. He has been in pain mgmt since 2004, per pt.  Preference of office, provider, location: He would like to go to [facility] pain management in [place].  If referral order, have you been seen by this specialty before? Yes (If Yes, this issue or another issue? When? Where? [facility]  Can we respond through MyChart? No, please call [phone]

## 2024-03-03 ENCOUNTER — Other Ambulatory Visit: Payer: Self-pay

## 2024-03-03 DIAGNOSIS — M545 Low back pain, unspecified: Secondary | ICD-10-CM

## 2024-03-03 DIAGNOSIS — G8929 Other chronic pain: Secondary | ICD-10-CM

## 2024-03-03 NOTE — Telephone Encounter (Signed)
 Referral placed.

## 2024-03-10 ENCOUNTER — Telehealth: Payer: Self-pay | Admitting: Family Medicine

## 2024-03-10 NOTE — Telephone Encounter (Signed)
 Copied from CRM [id number]. Topic: Referral - Question >> Mar 10, 2024  9:49 AM China J wrote: Reason for CRM: The patient was wondering if his pain management referral could be sent to [facility] instead. He will be out of medication by the 11th of next month and would like if the referral could be placed as urgent.  [facility] Second Floor, [address] [place], [place] [address] Phone: [phone]

## 2024-03-10 NOTE — Telephone Encounter (Signed)
 Can you redirect this referral at the Patient's request please?

## 2024-03-10 NOTE — Telephone Encounter (Signed)
 HI, good evening. I called pt to let him know about referral update. While talking to pt, he asked if he can get a rx for suboxane just enough until his pain management office gets him in. He is worried that he will run out and end up in the hospital. I explained to pt that I would send a message to his provider and someone from the office would be in contact with him. Thank you. Please call back and advise.

## 2024-03-10 NOTE — Telephone Encounter (Signed)
 R/c to pt to let him know referral has been updated and sent to [facility] Second Floor, [address] [place], [place] [address] Phone: [phone]

## 2024-03-14 ENCOUNTER — Ambulatory Visit

## 2024-03-22 ENCOUNTER — Telehealth: Payer: Self-pay | Admitting: Family Medicine

## 2024-03-22 ENCOUNTER — Other Ambulatory Visit: Payer: Self-pay

## 2024-03-22 DIAGNOSIS — M545 Low back pain, unspecified: Secondary | ICD-10-CM

## 2024-03-22 DIAGNOSIS — M25551 Pain in right hip: Secondary | ICD-10-CM

## 2024-03-22 DIAGNOSIS — G8929 Other chronic pain: Secondary | ICD-10-CM

## 2024-03-22 NOTE — Telephone Encounter (Unsigned)
 Copied from CRM [id number]. Topic: Referral - Request for Referral >> Mar 22, 2024 11:13 AM Delon DASEN wrote: Did the patient discuss referral with their provider in the last year? Yes (If No - schedule appointment) (If Yes - send message)  Appointment offered? No  Type of order/referral and detailed reason for visit: pain management  Preference of office, provider, location: [facility] in [place]  If referral order, have you been seen by this specialty before? Yes (If Yes, this issue or another issue? When? Where?  Can we respond through MyChart? No

## 2024-03-22 NOTE — Telephone Encounter (Signed)
Noted, Done 

## 2024-03-28 ENCOUNTER — Encounter: Payer: Self-pay | Admitting: *Deleted

## 2024-03-28 NOTE — Progress Notes (Signed)
 Thomas Rollins                                          MRN: [id number]   03/28/2024   The VBCI Quality Team Specialist reviewed this patient medical record for the purposes of chart review for care gap closure. The following were reviewed: abstraction for care gap closure-controlling blood pressure.    VBCI Quality Team

## 2024-03-31 ENCOUNTER — Other Ambulatory Visit (HOSPITAL_COMMUNITY): Payer: Self-pay

## 2024-03-31 ENCOUNTER — Telehealth: Payer: Self-pay | Admitting: Pharmacy Technician

## 2024-03-31 NOTE — Telephone Encounter (Signed)
 Pharmacy Patient Advocate Encounter   Received notification from Onbase CMM KEY that prior authorization for Ozempic  (2 MG/DOSE) 8MG /3ML pen-injectors is required/requested.   Insurance verification completed.   The patient is insured through [place].   Per test claim: PA required; PA submitted to above mentioned insurance via Latent Key/confirmation #/EOC [id number] Status is pending

## 2024-06-01 ENCOUNTER — Ambulatory Visit

## 2025-01-05 ENCOUNTER — Ambulatory Visit
